# Patient Record
Sex: Male | Born: 1960 | Hispanic: Yes | Marital: Married | Smoking: Never smoker
Health system: Southern US, Community
[De-identification: ages and names within clinical notes are randomized; demographics above are authoritative.]

## PROBLEM LIST (undated history)

## (undated) DIAGNOSIS — R112 Nausea with vomiting, unspecified: Secondary | ICD-10-CM

## (undated) DIAGNOSIS — Z9889 Other specified postprocedural states: Secondary | ICD-10-CM

## (undated) DIAGNOSIS — I10 Essential (primary) hypertension: Secondary | ICD-10-CM

## (undated) DIAGNOSIS — I499 Cardiac arrhythmia, unspecified: Secondary | ICD-10-CM

---

## 2020-11-03 DIAGNOSIS — I340 Nonrheumatic mitral (valve) insufficiency: Secondary | ICD-10-CM | POA: Diagnosis not present

## 2020-11-04 ENCOUNTER — Other Ambulatory Visit: Payer: Self-pay

## 2020-11-04 ENCOUNTER — Encounter (HOSPITAL_COMMUNITY)
Admission: RE | Disposition: A | Discharge status: Home / Self Care | Payer: Self-pay | Source: Other Acute Inpatient Hospital | Attending: Cardiology

## 2020-11-04 ENCOUNTER — Inpatient Hospital Stay (HOSPITAL_COMMUNITY)
Admission: RE | Admit: 2020-11-04 | Discharge: 2020-11-10 | DRG: 286 | Disposition: A | Discharge status: Home / Self Care | Payer: Medicaid Other | Source: Other Acute Inpatient Hospital | Attending: Internal Medicine | Admitting: Internal Medicine

## 2020-11-04 ENCOUNTER — Encounter (HOSPITAL_COMMUNITY): Payer: Self-pay

## 2020-11-04 DIAGNOSIS — E119 Type 2 diabetes mellitus without complications: Secondary | ICD-10-CM | POA: Diagnosis present

## 2020-11-04 DIAGNOSIS — I483 Typical atrial flutter: Secondary | ICD-10-CM | POA: Diagnosis not present

## 2020-11-04 DIAGNOSIS — I5020 Unspecified systolic (congestive) heart failure: Secondary | ICD-10-CM

## 2020-11-04 DIAGNOSIS — I10 Essential (primary) hypertension: Secondary | ICD-10-CM

## 2020-11-04 DIAGNOSIS — Z23 Encounter for immunization: Secondary | ICD-10-CM

## 2020-11-04 DIAGNOSIS — I5021 Acute systolic (congestive) heart failure: Secondary | ICD-10-CM | POA: Diagnosis present

## 2020-11-04 DIAGNOSIS — I5041 Acute combined systolic (congestive) and diastolic (congestive) heart failure: Secondary | ICD-10-CM | POA: Diagnosis not present

## 2020-11-04 DIAGNOSIS — G4489 Other headache syndrome: Secondary | ICD-10-CM

## 2020-11-04 DIAGNOSIS — Z86718 Personal history of other venous thrombosis and embolism: Secondary | ICD-10-CM | POA: Diagnosis not present

## 2020-11-04 DIAGNOSIS — E785 Hyperlipidemia, unspecified: Secondary | ICD-10-CM

## 2020-11-04 DIAGNOSIS — I110 Hypertensive heart disease with heart failure: Secondary | ICD-10-CM | POA: Diagnosis present

## 2020-11-04 DIAGNOSIS — I340 Nonrheumatic mitral (valve) insufficiency: Secondary | ICD-10-CM | POA: Diagnosis present

## 2020-11-04 DIAGNOSIS — H539 Unspecified visual disturbance: Secondary | ICD-10-CM

## 2020-11-04 DIAGNOSIS — I420 Dilated cardiomyopathy: Secondary | ICD-10-CM | POA: Diagnosis present

## 2020-11-04 DIAGNOSIS — I4892 Unspecified atrial flutter: Secondary | ICD-10-CM | POA: Diagnosis present

## 2020-11-04 DIAGNOSIS — I743 Embolism and thrombosis of arteries of the lower extremities: Secondary | ICD-10-CM

## 2020-11-04 DIAGNOSIS — Z825 Family history of asthma and other chronic lower respiratory diseases: Secondary | ICD-10-CM | POA: Diagnosis not present

## 2020-11-04 DIAGNOSIS — D352 Benign neoplasm of pituitary gland: Secondary | ICD-10-CM

## 2020-11-04 DIAGNOSIS — R072 Precordial pain: Secondary | ICD-10-CM | POA: Diagnosis present

## 2020-11-04 DIAGNOSIS — R0989 Other specified symptoms and signs involving the circulatory and respiratory systems: Secondary | ICD-10-CM

## 2020-11-04 HISTORY — DX: Essential (primary) hypertension: I10

## 2020-11-04 HISTORY — PX: RIGHT/LEFT HEART CATH AND CORONARY ANGIOGRAPHY: CATH118266

## 2020-11-04 HISTORY — PX: RIGHT/LEFT HEART CATH AND CORONARY/GRAFT ANGIOGRAPHY: CATH118267

## 2020-11-04 LAB — POCT I-STAT EG7
Acid-Base Excess: 2 mmol/L (ref 0.0–2.0)
Acid-Base Excess: 2 mmol/L (ref 0.0–2.0)
Bicarbonate: 28.6 mmol/L — ABNORMAL HIGH (ref 20.0–28.0)
Bicarbonate: 28.9 mmol/L — ABNORMAL HIGH (ref 20.0–28.0)
Calcium, Ion: 1.27 mmol/L (ref 1.15–1.40)
Calcium, Ion: 1.3 mmol/L (ref 1.15–1.40)
HCT: 43 % (ref 39.0–52.0)
HCT: 43 % (ref 39.0–52.0)
Hemoglobin: 14.6 g/dL (ref 13.0–17.0)
Hemoglobin: 14.6 g/dL (ref 13.0–17.0)
O2 Saturation: 67 %
O2 Saturation: 69 %
Potassium: 4.1 mmol/L (ref 3.5–5.1)
Potassium: 4.1 mmol/L (ref 3.5–5.1)
Sodium: 140 mmol/L (ref 135–145)
Sodium: 140 mmol/L (ref 135–145)
TCO2: 30 mmol/L (ref 22–32)
TCO2: 30 mmol/L (ref 22–32)
pCO2, Ven: 50.1 mmHg (ref 44.0–60.0)
pCO2, Ven: 50.8 mmHg (ref 44.0–60.0)
pH, Ven: 7.363 (ref 7.250–7.430)
pH, Ven: 7.364 (ref 7.250–7.430)
pO2, Ven: 37 mmHg (ref 32.0–45.0)
pO2, Ven: 38 mmHg (ref 32.0–45.0)

## 2020-11-04 LAB — POCT I-STAT 7, (LYTES, BLD GAS, ICA,H+H)
Acid-Base Excess: 0 mmol/L (ref 0.0–2.0)
Bicarbonate: 25.2 mmol/L (ref 20.0–28.0)
Calcium, Ion: 1.23 mmol/L (ref 1.15–1.40)
HCT: 42 % (ref 39.0–52.0)
Hemoglobin: 14.3 g/dL (ref 13.0–17.0)
O2 Saturation: 98 %
Potassium: 4 mmol/L (ref 3.5–5.1)
Sodium: 140 mmol/L (ref 135–145)
TCO2: 26 mmol/L (ref 22–32)
pCO2 arterial: 41.6 mmHg (ref 32.0–48.0)
pH, Arterial: 7.391 (ref 7.350–7.450)
pO2, Arterial: 115 mmHg — ABNORMAL HIGH (ref 83.0–108.0)

## 2020-11-04 LAB — TSH: TSH: 0.847 u[IU]/mL (ref 0.350–4.500)

## 2020-11-04 LAB — MAGNESIUM: Magnesium: 2.1 mg/dL (ref 1.7–2.4)

## 2020-11-04 SURGERY — RIGHT/LEFT HEART CATH AND CORONARY/GRAFT ANGIOGRAPHY
Anesthesia: LOCAL

## 2020-11-04 MED ORDER — AMIODARONE LOAD VIA INFUSION
150.0000 mg | Freq: Once | INTRAVENOUS | Status: AC
  Administered 2020-11-04: 150 mg via INTRAVENOUS
  Filled 2020-11-04: qty 83.34

## 2020-11-04 MED ORDER — VERAPAMIL HCL 2.5 MG/ML IV SOLN
INTRAVENOUS | Status: AC
  Filled 2020-11-04: qty 2

## 2020-11-04 MED ORDER — FENTANYL CITRATE (PF) 100 MCG/2ML IJ SOLN
INTRAMUSCULAR | Status: DC | PRN
  Administered 2020-11-04: 25 ug via INTRAVENOUS

## 2020-11-04 MED ORDER — ACETAMINOPHEN 325 MG PO TABS
650.0000 mg | ORAL_TABLET | ORAL | Status: DC | PRN
  Administered 2020-11-05 – 2020-11-09 (×3): 650 mg via ORAL
  Filled 2020-11-04 (×3): qty 2

## 2020-11-04 MED ORDER — IOHEXOL 350 MG/ML SOLN
INTRAVENOUS | Status: DC | PRN
  Administered 2020-11-04: 55 mL

## 2020-11-04 MED ORDER — VERAPAMIL HCL 2.5 MG/ML IV SOLN
INTRAVENOUS | Status: DC | PRN
  Administered 2020-11-04: 10 mL via INTRA_ARTERIAL

## 2020-11-04 MED ORDER — HEPARIN (PORCINE) 25000 UT/250ML-% IV SOLN
1250.0000 [IU]/h | INTRAVENOUS | Status: DC
  Administered 2020-11-05: 1100 [IU]/h via INTRAVENOUS
  Filled 2020-11-04: qty 250

## 2020-11-04 MED ORDER — SODIUM CHLORIDE 0.9% FLUSH: 3.0000 mL | INTRAVENOUS | Status: DC | PRN

## 2020-11-04 MED ORDER — HEPARIN (PORCINE) IN NACL 1000-0.9 UT/500ML-% IV SOLN
INTRAVENOUS | Status: DC | PRN
  Administered 2020-11-04 (×2): 500 mL

## 2020-11-04 MED ORDER — SODIUM CHLORIDE 0.9 % IV SOLN: 250.0000 mL | INTRAVENOUS | Status: DC | PRN

## 2020-11-04 MED ORDER — HEPARIN (PORCINE) IN NACL 1000-0.9 UT/500ML-% IV SOLN
INTRAVENOUS | Status: AC
  Filled 2020-11-04: qty 1000

## 2020-11-04 MED ORDER — HEPARIN SODIUM (PORCINE) 1000 UNIT/ML IJ SOLN
INTRAMUSCULAR | Status: DC | PRN
  Administered 2020-11-04: 4000 [IU] via INTRAVENOUS

## 2020-11-04 MED ORDER — HYDRALAZINE HCL 20 MG/ML IJ SOLN: 10.0000 mg | INTRAMUSCULAR | Status: AC | PRN

## 2020-11-04 MED ORDER — SODIUM CHLORIDE 0.9% FLUSH
3.0000 mL | Freq: Two times a day (BID) | INTRAVENOUS | Status: DC
  Administered 2020-11-04 – 2020-11-10 (×7): 3 mL via INTRAVENOUS

## 2020-11-04 MED ORDER — FENTANYL CITRATE (PF) 100 MCG/2ML IJ SOLN
INTRAMUSCULAR | Status: AC
  Filled 2020-11-04: qty 2

## 2020-11-04 MED ORDER — SODIUM CHLORIDE 0.9 % IV SOLN: INTRAVENOUS | Status: AC

## 2020-11-04 MED ORDER — AMIODARONE HCL IN DEXTROSE 360-4.14 MG/200ML-% IV SOLN
30.0000 mg/h | INTRAVENOUS | Status: DC
  Administered 2020-11-05 – 2020-11-07 (×4): 30 mg/h via INTRAVENOUS
  Filled 2020-11-04 (×5): qty 200

## 2020-11-04 MED ORDER — DIPHENHYDRAMINE HCL 25 MG PO CAPS
25.0000 mg | ORAL_CAPSULE | Freq: Four times a day (QID) | ORAL | Status: DC | PRN
  Administered 2020-11-04 – 2020-11-07 (×6): 25 mg via ORAL
  Filled 2020-11-04 (×6): qty 1

## 2020-11-04 MED ORDER — PNEUMOCOCCAL VAC POLYVALENT 25 MCG/0.5ML IJ INJ
0.5000 mL | INJECTION | INTRAMUSCULAR | Status: DC
  Filled 2020-11-04: qty 0.5

## 2020-11-04 MED ORDER — LABETALOL HCL 5 MG/ML IV SOLN: 10.0000 mg | INTRAVENOUS | Status: AC | PRN

## 2020-11-04 MED ORDER — MIDAZOLAM HCL 2 MG/2ML IJ SOLN
INTRAMUSCULAR | Status: AC
  Filled 2020-11-04: qty 2

## 2020-11-04 MED ORDER — LIDOCAINE HCL (PF) 1 % IJ SOLN
INTRAMUSCULAR | Status: DC | PRN
  Administered 2020-11-04 (×2): 2 mL

## 2020-11-04 MED ORDER — ONDANSETRON HCL 4 MG/2ML IJ SOLN
4.0000 mg | Freq: Four times a day (QID) | INTRAMUSCULAR | Status: DC | PRN
  Administered 2020-11-09: 4 mg via INTRAVENOUS
  Filled 2020-11-04: qty 2

## 2020-11-04 MED ORDER — LIDOCAINE HCL (PF) 1 % IJ SOLN
INTRAMUSCULAR | Status: AC
  Filled 2020-11-04: qty 30

## 2020-11-04 MED ORDER — LOSARTAN POTASSIUM 25 MG PO TABS
25.0000 mg | ORAL_TABLET | Freq: Every day | ORAL | Status: DC
  Administered 2020-11-04 – 2020-11-05 (×2): 25 mg via ORAL
  Filled 2020-11-04 (×2): qty 1

## 2020-11-04 MED ORDER — MIDAZOLAM HCL 2 MG/2ML IJ SOLN
INTRAMUSCULAR | Status: DC | PRN
  Administered 2020-11-04: 1 mg via INTRAVENOUS

## 2020-11-04 MED ORDER — AMIODARONE HCL IN DEXTROSE 360-4.14 MG/200ML-% IV SOLN
60.0000 mg/h | INTRAVENOUS | Status: AC
  Administered 2020-11-04 (×2): 60 mg/h via INTRAVENOUS
  Filled 2020-11-04: qty 200

## 2020-11-04 MED ORDER — METOPROLOL SUCCINATE ER 50 MG PO TB24
50.0000 mg | ORAL_TABLET | Freq: Every day | ORAL | Status: DC
  Administered 2020-11-04 – 2020-11-08 (×5): 50 mg via ORAL
  Filled 2020-11-04 (×6): qty 1

## 2020-11-04 MED ORDER — HEPARIN SODIUM (PORCINE) 1000 UNIT/ML IJ SOLN
INTRAMUSCULAR | Status: AC
  Filled 2020-11-04: qty 1

## 2020-11-04 SURGICAL SUPPLY — 12 items
CATH 5FR JL3.5 JR4 ANG PIG MP (CATHETERS) ×2 IMPLANT
CATH BALLN WEDGE 5F 110CM (CATHETERS) ×2 IMPLANT
DEVICE RAD COMP TR BAND LRG (VASCULAR PRODUCTS) ×4 IMPLANT
GLIDESHEATH SLEND SS 6F .021 (SHEATH) ×2 IMPLANT
GUIDEWIRE INQWIRE 1.5J.035X260 (WIRE) ×1 IMPLANT
INQWIRE 1.5J .035X260CM (WIRE) ×2
KIT HEART LEFT (KITS) ×2 IMPLANT
PACK CARDIAC CATHETERIZATION (CUSTOM PROCEDURE TRAY) ×2 IMPLANT
SHEATH GLIDE SLENDER 4/5FR (SHEATH) ×2 IMPLANT
SYR MEDRAD MARK 7 150ML (SYRINGE) ×2 IMPLANT
TRANSDUCER W/STOPCOCK (MISCELLANEOUS) ×2 IMPLANT
TUBING CIL FLEX 10 FLL-RA (TUBING) ×2 IMPLANT

## 2020-11-04 NOTE — Progress Notes (Signed)
ANTICOAGULATION CONSULT NOTE - Initial Consult  Pharmacy Consult for heparin Indication: atrial fibrillation  Not on File  Patient Measurements: Height: 5\' 6"  (167.6 cm) Weight: 78 kg (172 lb) IBW/kg (Calculated) : 63.8  Vital Signs: Temp: 97.7 F (36.5 C) (07/15 1616) Temp Source: Oral (07/15 1616) BP: 126/94 (07/15 1548) Pulse Rate: 109 (07/15 1616)  Labs: No results for input(s): HGB, HCT, PLT, APTT, LABPROT, INR, HEPARINUNFRC, HEPRLOWMOCWT, CREATININE, CKTOTAL, CKMB, TROPONINIHS in the last 72 hours.  CrCl cannot be calculated (No successful lab value found.).   Medical History: Past Medical History:  Diagnosis Date   HTN (hypertension)     Medications:  Medication history pending - dispense report shows no medications  Assessment: 57 yom transferred from [facility] for evaluation of chest pain; no anticoagulation PTA noted and none given at [facility] per MD note. He is also noted to have persistent A flutter. Patient is now s/p cath with normal coronary anatomy. Pharmacy consulted to begin IV heparin drip 8 hrs post sheath removal for A flutter. Sheath removed at 15:53. Plan is to transition to apixaban tomorrow with TEE-DCCV on Mon if A flutter persists.   RH labs from 7/14:  WBC 6.7 H/H 14.4/42.8 Plt 209 SCr 1  Goal of Therapy:  Heparin level 0.3-0.7 units/ml Monitor platelets by anticoagulation protocol: Yes   Plan:  Begin heparin drip at 1100 units/hr with no bolus at midnight 6h heparin level Daily heparin level, CBC Monitor for s/sx of bleeding F/U transition to apixaban  Thank you for involving pharmacy in this patient's care.  Renold Genta, PharmD, BCPS Clinical Pharmacist Clinical phone for 11/04/2020 until 10p is [phone] 11/04/2020 4:47 PM  **Pharmacist phone directory can be found on [email or web address] listed under [place]**

## 2020-11-04 NOTE — Plan of Care (Signed)
  Problem: Education: Goal: Understanding of CV disease, CV risk reduction, and recovery process will improve Outcome: Progressing Goal: Individualized Educational Video(s) Outcome: Progressing   

## 2020-11-04 NOTE — H&P (Signed)
Cardiology Admission History and Physical:   Patient ID: Hunter Miller MRN: [id number]; DOB: 04-09-1961   Admission date: 11/04/2020  PCP:  Pcp, No   [facility] Providers Cardiologist:  Dr. Harriet Masson, MD}    Chief Complaint:  Chest pain   Patient Profile:   Hunter Miller is a 60 y.o. male with a history of hypertension and hyperlipidemia with questionable history of arterial embolization in his right femoral artery approximately 10 years prior who was seen in cardiology consultation at [facility] for the evaluation of chest pain.    History of Present Illness:   [name] is a 60 year old male with a history stated above who presented to [facility] for the evaluation of chest pain. Patient reportedly had a several week history of worsening chest pain on exertion described as a left-sided stabbing sensation.  Symptoms were abrupt but intermittent in duration.  Given his symptoms, he presented to [facility] for further evaluation.  Chest pain improved with sublingual nitroglycerin.  Additionally, patient had symptoms of what sounds like palpitations for the last several months which time he was noticing his heartbeat would be "fast and irregular".  While still at [facility] Echocardiogram which showed severe global hypokinesis of LV at 20 to 25% with normal left and right atrial size, mild aortic valve sclerosis with no stenosis, mild to moderate mitral regurgitation, trace tricuspid regurgitation and no pericardial effusion.  On Dr. Terrial Rhodes evaluation, he was noted to be in atrial flutter 3-1 with suspicion this was contributing to his reduced LVEF.  Plan was to transfer to [facility] for right and left cardiac catheterization followed by possible TEE cardioversion.  Anticoagulation was held given the need for LHC.  Patient was given lisinopril however this was discontinued with plans for [facility] after ACEI washout.  Past Medical History:  Diagnosis Date   HTN  (hypertension)     The histories are not reviewed yet. Please review them in the "History" navigator section and refresh this [place].   Medications Prior to Admission: Prior to Admission medications   Not on File    Allergies:   Not on File  Social History:   Social History   Socioeconomic History   Marital status: Married    Spouse name: Not on file   Number of children: Not on file   Years of education: Not on file   Highest education level: Not on file  Occupational History   Not on file  Tobacco Use   Smoking status: Not on file   Smokeless tobacco: Not on file  Substance and Sexual Activity   Alcohol use: Not on file   Drug use: Not on file   Sexual activity: Not on file  Other Topics Concern   Not on file  Social History Narrative   Not on file   Social Determinants of Health   Financial Resource Strain: Not on file  Food Insecurity: Not on file  Transportation Needs: Not on file  Physical Activity: Not on file  Stress: Not on file  Social Connections: Not on file  Intimate Partner Violence: Not on file    Family History:    The patient's family history includes Asthma in his father; COPD in his father.    ROS:  Please see the history of present illness.  All other ROS reviewed and negative.     Physical Exam/Data:  There were no vitals filed for this visit. No intake or output data in the 24 hours ending 11/04/20 1522 No  flowsheet data found.   There is no height or weight on file to calculate BMI.   Physical exam performed by interventional cardiologist in the cardiac catheterization lab  Relevant CV Studies:  LHC 11/04/2020: Pending   Echocardiogram 11/04/2020:  1.  There are severe global hypokinesis of the LV 2.  Overall left ventricular systolic function is severely impaired with an EF between 20 and 25% 3.  The right ventricle is normal in size and function 4.  The left atrium is normal in size 5.  The right atrium is normal in size  and function 6.  There is mild aortic valve sclerosis 7.  Mild to moderate mitral regurgitation is present 8.  Trace tricuspid regurgitation present 9.  The pulmonic valve is normal 10.  There is no pericardial effusion  Laboratory Data:  High Sensitivity Troponin:  No results for input(s): TROPONINIHS in the last 720 hours.    ChemistryNo results for input(s): NA, K, CL, CO2, GLUCOSE, BUN, CREATININE, CALCIUM, GFRNONAA, GFRAA, ANIONGAP in the last 168 hours.  No results for input(s): PROT, ALBUMIN, AST, ALT, ALKPHOS, BILITOT in the last 168 hours. HematologyNo results for input(s): WBC, RBC, HGB, HCT, MCV, MCH, MCHC, RDW, PLT in the last 168 hours. BNPNo results for input(s): BNP, PROBNP in the last 168 hours.  DDimer No results for input(s): DDIMER in the last 168 hours.   Radiology/Studies:  No results found.  Assessment and Plan:   1.  Chest pain with newly reduced LVEF: -Patient presented to [facility] with atypical chest pain found to have a severely reduced LVEF, dilated cardiomyopathy at 20 to 25% per echocardiogram on 11/07/2020.  Plan was for transfer to [facility] for LHC to rule out ischemic etiology.  Also noted to be in atrial flutter 3-1 felt to also be playing a role in his cardiomyopathy.  Initially patient started on lisinopril per hospitalist team however this was discontinued with a plan to start low-dose Entresto if BP and renal function allows.  2.  Atrial flutter, paroxysmal: -Noted to be in atrial flutter with poor rate control on cardiology consultation.  Patient not started on rate controlling medications or anticoagulation given the need for [facility] and transferred to [facility].  Plan pending LHC results.  Likely will need IV amiodarone for better rate control in the post-cath setting.  If difficult to control rate, may need to consider TEE/DCCV. -CHA2DS2VASc at least 3 based on HTN +1, prior thromboembolism +2  3.  HTN: -Stable, 126/96 -Unclear if patient is taking  antihypertensives  4.  HLD: -Lipid panel, CHO-251, LDL-173 -Will obtain LFTs with a.m. labs -Start 40 mg daily uptitrate if LHC reveals significant CAD  5.  Mild to moderate mitral regurgitation: -Noted on echocardiogram performed at [facility] 11/04/2020 -Monitor with serial imaging  6.  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 Terrial Rhodes note) there was questionable history of arterial embolism which the patient previously was on anticoagulation however there was limited information and poor history on this  Risk Assessment/Risk Scores:  { Severity of Illness: The appropriate patient status for this patient is INPATIENT. Inpatient status is judged to be reasonable and necessary in order to provide the required intensity of service to ensure the patient's safety. The patient's presenting symptoms, physical exam findings, and initial radiographic and laboratory data in the context of their chronic comorbidities is felt to place them at high risk for further clinical deterioration. Furthermore, it is not anticipated  that the patient will be medically stable for discharge from the hospital within 2 midnights of admission. The following factors support the patient status of inpatient.   " The patient's presenting symptoms include chest pain, palpitations. " The worrisome physical exam findings include chest pain/abnormal EKG, abnormal echocardiogram. " The initial radiographic and laboratory data are worrisome because of abnormal echocardiogram. " The chronic co-morbidities include hypertension.  * I certify that at the point of admission it is my clinical judgment that the patient will require inpatient hospital care spanning beyond 2 midnights from the point of admission due to high intensity of service, high risk for further deterioration and high frequency of surveillance required.*   For questions or updates, please contact [place] Please consult [email or web address] for contact info under     Signed, Kathyrn Drown, NP  11/04/2020 3:22 PM

## 2020-11-05 DIAGNOSIS — R072 Precordial pain: Secondary | ICD-10-CM

## 2020-11-05 DIAGNOSIS — I483 Typical atrial flutter: Secondary | ICD-10-CM

## 2020-11-05 LAB — CBC
HCT: 41.7 % (ref 39.0–52.0)
Hemoglobin: 13.7 g/dL (ref 13.0–17.0)
MCH: 27.1 pg (ref 26.0–34.0)
MCHC: 32.9 g/dL (ref 30.0–36.0)
MCV: 82.6 fL (ref 80.0–100.0)
Platelets: 250 K/uL (ref 150–400)
RBC: 5.05 MIL/uL (ref 4.22–5.81)
RDW: 14.6 % (ref 11.5–15.5)
WBC: 9.7 K/uL (ref 4.0–10.5)
nRBC: 0 % (ref 0.0–0.2)

## 2020-11-05 LAB — HEPARIN LEVEL (UNFRACTIONATED): Heparin Unfractionated: 0.19 IU/mL — ABNORMAL LOW (ref 0.30–0.70)

## 2020-11-05 MED ORDER — APIXABAN 5 MG PO TABS
5.0000 mg | ORAL_TABLET | Freq: Two times a day (BID) | ORAL | Status: DC
  Administered 2020-11-05 – 2020-11-10 (×11): 5 mg via ORAL
  Filled 2020-11-05 (×11): qty 1

## 2020-11-05 MED ORDER — FUROSEMIDE 10 MG/ML IJ SOLN
40.0000 mg | Freq: Once | INTRAMUSCULAR | Status: AC
  Administered 2020-11-05: 40 mg via INTRAVENOUS
  Filled 2020-11-05: qty 4

## 2020-11-05 NOTE — Discharge Instructions (Addendum)
Stay off work until after clinic follow-up.  Information on my medicine - ELIQUIS (apixaban)  This medication education was reviewed with me or my healthcare representative as part of my discharge preparation.    Why was Eliquis prescribed for you? Eliquis was prescribed for you to reduce the risk of a blood clot forming that can cause a stroke if you have a medical condition called atrial fibrillation (a type of irregular heartbeat).  What do You need to know about Eliquis ? Take your Eliquis TWICE DAILY - one tablet in the morning and one tablet in the evening with or without food. If you have difficulty swallowing the tablet whole please discuss with your pharmacist how to take the medication safely.  Take Eliquis exactly as prescribed by your doctor and DO NOT stop taking Eliquis without talking to the doctor who prescribed the medication.  Stopping may increase your risk of developing a stroke.  Refill your prescription before you run out.  After discharge, you should have regular check-up appointments with your healthcare provider that is prescribing your Eliquis.  In the future your dose may need to be changed if your kidney function or weight changes by a significant amount or as you get older.  What do you do if you miss a dose? If you miss a dose, take it as soon as you remember on the same day and resume taking twice daily.  Do not take more than one dose of ELIQUIS at the same time to make up a missed dose.  Important Safety Information A possible side effect of Eliquis is bleeding. You should call your healthcare provider right away if you experience any of the following: Bleeding from an injury or your nose that does not stop. Unusual colored urine (red or dark brown) or unusual colored stools (red or black). Unusual bruising for unknown reasons. A serious fall or if you hit your head (even if there is no bleeding).  Some medicines may interact with Eliquis and might  increase your risk of bleeding or clotting while on Eliquis. To help avoid this, consult your healthcare provider or pharmacist prior to using any new prescription or non-prescription medications, including herbals, vitamins, non-steroidal anti-inflammatory drugs (NSAIDs) and supplements.  This website has more information on Eliquis (apixaban): http://www.eliquis.com/eliquis/home

## 2020-11-05 NOTE — Consult Note (Addendum)
Advanced Heart Failure Team Consult Note   Primary Physician: Pcp, No PCP-Cardiologist:  None  Reason for Consultation: Acute systolic HF  HPI:    Hunter Miller is seen today for evaluation of acute systolic HF at the request of Dr. [place].   Hunter Miller is a 60 year old male with HTN, HL and RFA embolization. Admitted to [facility] with CP and palpitations.   ECG showed AFL with RVR. Echocardiogram which showed severe global hypokinesis of LV at 20 to 25% with normal left and right atrial size, mild aortic valve sclerosis with no stenosis, mild to moderate mitral regurgitation, trace tricuspid regurgitation and no pericardial effusion.   Had cath 7/16. 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 Started on IV amio and GDMT.   Feels SOB/+ orthopnea  Review of Systems: [y] = yes, [ ]  = no   General: Weight gain [ ] ; Weight loss [ ] ; Anorexia [ ] ; Fatigue [ ] ; Fever [ ] ; Chills [ ] ; Weakness [ ]   Cardiac: Chest pain/pressure [ y]; Resting SOB [ ] ; Exertional SOB [ y]; Orthopnea [ ] ; Pedal Edema [ ] ; Palpitations Blue.Reese ]; Syncope [ ] ; Presyncope [ ] ; Paroxysmal nocturnal dyspnea[ ]   Pulmonary: Cough [ ] ; Wheezing[ ] ; Hemoptysis[ ] ; Sputum [ ] ; Snoring [ ]   GI: Vomiting[ ] ; Dysphagia[ ] ; Melena[ ] ; Hematochezia [ ] ; Heartburn[ ] ; Abdominal pain [ ] ; Constipation [ ] ; Diarrhea [ ] ; BRBPR [ ]   GU: Hematuria[ ] ; Dysuria [ ] ; Nocturia[ ]   Vascular: Pain in legs with walking [ ] ; Pain in feet with lying flat [ ] ; Non-healing sores [ ] ; Stroke [ ] ; TIA [ ] ; Slurred speech [ ] ;  Neuro: Headaches[ ] ; Vertigo[ ] ; Seizures[ ] ; Paresthesias[ ] ;Blurred vision [ ] ; Diplopia [ ] ; Vision changes [ ]   Ortho/Skin: Arthritis [ y]; Joint pain Blue.Reese ]; Muscle pain [ ] ; Joint swelling [ ] ; Back Pain [ ] ; Rash [ ]   Psych: Depression[ ] ; Anxiety[ ]   Heme: Bleeding problems [ ] ; Clotting disorders [ ] ; Anemia [ ]   Endocrine: Diabetes [ ] ; Thyroid dysfunction[ ]    Past  Medical History: Past Medical History:  Diagnosis Date   HTN (hypertension)     Family History: Family History  Problem Relation Age of Onset   Asthma Father    COPD Father     Social History: Social History   Socioeconomic History   Marital status: Married    Spouse name: Not on file   Number of children: Not on file   Years of education: Not on file   Highest education level: Not on file  Occupational History   Not on file  Tobacco Use   Smoking status: Never   Smokeless tobacco: Never  Substance and Sexual Activity   Alcohol use: Not on file   Drug use: Not on file   Sexual activity: Not on file  Other Topics Concern   Not on file  Social History Narrative   Not on file   Social Determinants of Health   Financial Resource Strain: Not on file  Food Insecurity: Not on file  Transportation Needs: Not on file  Physical Activity: Not on file  Stress: Not on file  Social Connections: Not on file    Allergies:  No Known Allergies  Objective:    Vital Signs:   Temp:  [97.7 F (36.5 C)-98.5 F (36.9 C)] 97.7 F (36.5 C) (07/16 1219) Pulse Rate:  [  64-114] 101 (07/16 1219) Resp:  [8-31] 18 (07/16 1219) BP: (98-138)/(60-103) 112/90 (07/16 1242) SpO2:  [96 %-100 %] 99 % (07/16 1219) Weight:  [78 kg] 78 kg (07/15 1643) Last BM Date: 11/03/20  Weight change: Filed Weights   11/04/20 1643  Weight: 78 kg    Intake/Output:   Intake/Output Summary (Last 24 hours) at 11/05/2020 1353 Last data filed at 11/04/2020 1800 Gross per 24 hour  Intake 87.51 ml  Output --  Net 87.51 ml      Physical Exam    General:  Sitting up No resp difficulty HEENT: normal Neck: supple. JVP 8-9 . Carotids 2+ bilat; no bruits. No lymphadenopathy or thyromegaly appreciated. Cor: PMI nondisplaced. Irregular rate & rhythm. No rubs, gallops or murmurs. Lungs: clear Abdomen: soft, nontender, nondistended. No hepatosplenomegaly. No bruits or masses. Good bowel  sounds. Extremities: no cyanosis, clubbing, rash, edema Neuro: alert & orientedx3, cranial nerves grossly intact. moves all 4 extremities w/o difficulty. Affect pleasant   Telemetry   AFL 100-110 Personally reviewed   EKG     (From [place]) AFL with variable block. Rate 71. Diffuse TWI Personally reviewed    Labs   Basic Metabolic Panel: Recent Labs  Lab 11/04/20 1539 11/04/20 1542 11/04/20 1729  NA 140 140  140  --   K 4.0 4.1  4.1  --   MG  --   --  2.1    Liver Function Tests: No results for input(s): AST, ALT, ALKPHOS, BILITOT, PROT, ALBUMIN in the last 168 hours. No results for input(s): LIPASE, AMYLASE in the last 168 hours. No results for input(s): AMMONIA in the last 168 hours.  CBC: Recent Labs  Lab 11/04/20 1539 11/04/20 1542 11/05/20 0612  WBC  --   --  9.7  HGB 14.3 14.6  14.6 13.7  HCT 42.0 43.0  43.0 41.7  MCV  --   --  82.6  PLT  --   --  250    Cardiac Enzymes: No results for input(s): CKTOTAL, CKMB, CKMBINDEX, TROPONINI in the last 168 hours.  BNP: BNP (last 3 results) No results for input(s): BNP in the last 8760 hours.  ProBNP (last 3 results) No results for input(s): PROBNP in the last 8760 hours.   CBG: No results for input(s): GLUCAP in the last 168 hours.  Coagulation Studies: No results for input(s): LABPROT, INR in the last 72 hours.   Imaging   CARDIAC CATHETERIZATION  Result Date: 11/04/2020 Formatting of this result is different from the original.   LV end diastolic pressure is normal. Very large and normal coronary arteries. Normal LV filling pressures. Normal right  heart pressures. Reduced cardiac output. Index 2.26. Plan: will resume IV heparin tonight with plans to initiate oral anticoagulation with Eliquis in am. Change ACEi to losartan in anticipation of transitioning to [place] after 3 days. Start Toprol XL for rate control. Load with IV amiodarone. If Atrial flutter persists plan TEE guided DCCV early in  the week.     Medications:     Current Medications:  apixaban  5 mg Oral BID   losartan  25 mg Oral q1800   metoprolol succinate  50 mg Oral q1800   pneumococcal 23 valent vaccine  0.5 mL Intramuscular Tomorrow-1000   sodium chloride flush  3 mL Intravenous Q12H    Infusions:  sodium chloride     amiodarone 30 mg/hr (11/05/20 0804)      Assessment/Plan   1. Acute systolic HF - Echo LVEF  20-25% with normal left and right atrial size, mild aortic valve sclerosis with no stenosis, mild to moderate mitral regurgitation, trace tricuspid regurgitation and no pericardial effusion.  - Cath 7/16. 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 Give IV lasix x 1.  - Continue losartan 25 daily (can switch to [facility] after DC-CV if BP permits)  - Continue Toprol 50 daily  - Add spiro 12.5 - Plan TEE/DC-CV Monday/Tuesday depending on schedule  2. Atrial flutter - Continue IV amio & Eliquis - Plan TEE/DC-CV Monday/Tuesday depending on schedule - EP following for outpatient ablation - No ECG in Epic. Will obtain.  - Mild snoring. Will need PSG. Denies ETOH  3. HL  -Lipid panel, CHO-251, LDL-173 - consider statin  Length of Stay: 1  Glori Bickers, MD  11/05/2020, 1:53 PM  Advanced Heart Failure Team Pager [phone] (M-F; 7a - 5p)  Please contact [facility] for night-coverage after hours (4p -7a ) and weekends on [email or web address]

## 2020-11-05 NOTE — Progress Notes (Addendum)
ANTICOAGULATION CONSULT NOTE - Initial Consult  Pharmacy Consult for heparin Indication: atrial fibrillation  No Known Allergies  Patient Measurements: Height: 5\' 6"  (167.6 cm) Weight: 78 kg (172 lb) IBW/kg (Calculated) : 63.8  Vital Signs: Temp: 98.3 F (36.8 C) (07/16 0505) Temp Source: Oral (07/16 0505) BP: 109/80 (07/16 0505) Pulse Rate: 74 (07/16 0505)  Labs: Recent Labs    11/04/20 1539 11/04/20 1542 11/05/20 0612  HGB 14.3 14.6  14.6 13.7  HCT 42.0 43.0  43.0 41.7  PLT  --   --  250  HEPARINUNFRC  --   --  0.19*    CrCl cannot be calculated (No successful lab value found.).   Medical History: Past Medical History:  Diagnosis Date   HTN (hypertension)      Assessment: 60 yo M with new persistent A flutter. Patient is now s/p cath with normal coronary anatomy. No anticoagulation prior to admission. Pharmacy consulted for heparin.    HL 0.19 subtherapeutic on 1100 units/hr. Plan is to transition to apixaban 7/16 with TEE cardioversion 7/18, pending team assessment. H/H, plt stable.  No issues with infusion or bleeding per RN.  Goal of Therapy:  Heparin level 0.3-0.7 units/ml Monitor platelets by anticoagulation protocol: Yes   Plan:  Increase heparin to 1250 units/hr F/u 8hr HL  Monitor daily HL, CBC/plt Monitor for signs/symptoms of bleeding  F/U transition to apixaban  Benetta Spar, PharmD, BCPS, [place] Clinical Pharmacist  Please check AMION for all [place] phone numbers After 10:00 PM, call [place]

## 2020-11-05 NOTE — Progress Notes (Signed)
Was called to pt bedside for a complaint of chest pain and shortness of breath, medial, rated 5/10. VS WNL, O2 applied at 4L and 12-lead EKG obtained showing atrial flutter. I handed this to MD Quentin Ore and told him a review of his symptoms. Patient stated he feels "a little better" now. Left patient on 2L O2 saturating 100%. MD said continue current therapy and monitor at this time.

## 2020-11-05 NOTE — Progress Notes (Signed)
Progress Note  Patient Name: Hunter Miller Date of Encounter: 11/05/2020  [facility] Cardiologist: None   Subjective   Pt with chest pain this AM. Vitals stable. Repeat ECG stable showing AFL.  Patient without pain at the time of my evaluation. Complaining of an itchy rash/bites on his skin/hands/back/stomach.  Inpatient Medications    Scheduled Meds:  losartan  25 mg Oral q1800   metoprolol succinate  50 mg Oral q1800   pneumococcal 23 valent vaccine  0.5 mL Intramuscular Tomorrow-1000   sodium chloride flush  3 mL Intravenous Q12H   Continuous Infusions:  sodium chloride     amiodarone 30 mg/hr (11/05/20 0804)   heparin 1,250 Units/hr (11/05/20 0805)   PRN Meds: sodium chloride, acetaminophen, diphenhydrAMINE, ondansetron (ZOFRAN) IV, sodium chloride flush   Vital Signs    Vitals:   11/05/20 0505 11/05/20 0800 11/05/20 0932 11/05/20 0954  BP: 109/80 (!) 127/96 98/60 113/89  Pulse: 74  76 72  Resp: 15 16 16    Temp: 98.3 F (36.8 C)     TempSrc: Oral     SpO2: 97%  100% 100%  Weight:      Height:        Intake/Output Summary (Last 24 hours) at 11/05/2020 1041 Last data filed at 11/04/2020 1800 Gross per 24 hour  Intake 87.51 ml  Output --  Net 87.51 ml   Last 3 Weights 11/04/2020  Weight (lbs) 172 lb  Weight (kg) 78.019 kg      Telemetry    AFL with variable AV conduction. - Personally Reviewed  ECG    AFL - Personally Reviewed  Physical Exam   GEN: No acute distress.   Neck: No JVD Cardiac: irregularly irregular, no murmurs, rubs, or gallops.  Respiratory: Clear to auscultation bilaterally. GI: Soft, nontender, non-distended  MS: No edema; No deformity. Neuro:  Nonfocal  Psych: Normal affect   Labs    High Sensitivity Troponin:  No results for input(s): TROPONINIHS in the last 720 hours.    Chemistry Recent Labs  Lab 11/04/20 1539 11/04/20 1542  NA 140 140  140  K 4.0 4.1  4.1     Hematology Recent Labs  Lab  11/04/20 1539 11/04/20 1542 11/05/20 0612  WBC  --   --  9.7  RBC  --   --  5.05  HGB 14.3 14.6  14.6 13.7  HCT 42.0 43.0  43.0 41.7  MCV  --   --  82.6  MCH  --   --  27.1  MCHC  --   --  32.9  RDW  --   --  14.6  PLT  --   --  250    BNPNo results for input(s): BNP, PROBNP in the last 168 hours.   DDimer No results for input(s): DDIMER in the last 168 hours.   Radiology    CARDIAC CATHETERIZATION  Result Date: 11/04/2020 Formatting of this result is different from the original.   LV end diastolic pressure is normal. Very large and normal coronary arteries. Normal LV filling pressures. Normal right  heart pressures. Reduced cardiac output. Index 2.26. Plan: will resume IV heparin tonight with plans to initiate oral anticoagulation with Eliquis in am. Change ACEi to losartan in anticipation of transitioning to [place] after 3 days. Start Toprol XL for rate control. Load with IV amiodarone. If Atrial flutter persists plan TEE guided DCCV early in the week.    Cardiac Studies   [facility] 11/04/2020 personally reviewed, no  significant CAD  11/04/2020 TTE 1.  There are severe global hypokinesis of the LV 2.  Overall left ventricular systolic function is severely impaired with an EF between 20 and 25% 3.  The right ventricle is normal in size and function 4.  The left atrium is normal in size 5.  The right atrium is normal in size and function 6.  There is mild aortic valve sclerosis 7.  Mild to moderate mitral regurgitation is present 8.  Trace tricuspid regurgitation present 9.  The pulmonic valve is normal 10.  There is no pericardial effusion   Assessment & Plan    Mr Hunter Miller is a 60yo man w/ hx of HTN, HLD who presented from [facility] with chest pain. [place] 11/04/2020 shows no significant CAD. Echo showed EF 20-25%.  #Chest pain - no significant CAD - ECG without ischemic signs  #AFL Variable AV conduction Likely contributing to LV dysfunction Restoration of  sinus rhythm will be important for the treatment of his LV dysfunction - NPO Sunday night for DCCV - TEE/DCCV Monday - heparin gtt for now  #Acute systolic HF Warm and dry. NYHA III. Likely tachy-mediated - TEE/DCCV Monday - cont losartan and metoprolol succinate - consider transition to entresto as outpatient - continue amiodarone for now, would like to avoid long term use - I will plan to see as outpatient to discuss ablation as a long term strategy to avoid amio    For questions or updates, please contact [place] Please consult [email or web address] for contact info under        Signed, Vickie Epley, MD  11/05/2020, 10:41 AM

## 2020-11-06 ENCOUNTER — Encounter (HOSPITAL_COMMUNITY): Payer: Self-pay | Admitting: Cardiology

## 2020-11-06 LAB — BASIC METABOLIC PANEL
Anion gap: 7 (ref 5–15)
BUN: 20 mg/dL (ref 8–23)
CO2: 26 mmol/L (ref 22–32)
Calcium: 9.3 mg/dL (ref 8.9–10.3)
Chloride: 100 mmol/L (ref 98–111)
Creatinine, Ser: 1.32 mg/dL — ABNORMAL HIGH (ref 0.61–1.24)
GFR, Estimated: >60 mL/min (ref >60)
Glucose, Bld: 106 mg/dL — ABNORMAL HIGH (ref 70–99)
Potassium: 3.4 mmol/L — ABNORMAL LOW (ref 3.5–5.1)
Sodium: 133 mmol/L — ABNORMAL LOW (ref 135–145)

## 2020-11-06 LAB — CBC
HCT: 40.1 % (ref 39.0–52.0)
Hemoglobin: 13.5 g/dL (ref 13.0–17.0)
MCH: 27.8 pg (ref 26.0–34.0)
MCHC: 33.7 g/dL (ref 30.0–36.0)
MCV: 82.5 fL (ref 80.0–100.0)
Platelets: 231 10*3/uL (ref 150–400)
RBC: 4.86 MIL/uL (ref 4.22–5.81)
RDW: 14.1 % (ref 11.5–15.5)
WBC: 7.9 10*3/uL (ref 4.0–10.5)
nRBC: 0 % (ref 0.0–0.2)

## 2020-11-06 MED ORDER — SACUBITRIL-VALSARTAN 24-26 MG PO TABS
1.0000 | ORAL_TABLET | Freq: Two times a day (BID) | ORAL | Status: DC
  Administered 2020-11-06 – 2020-11-07 (×3): 1 via ORAL
  Filled 2020-11-06 (×3): qty 1

## 2020-11-06 MED ORDER — POTASSIUM CHLORIDE CRYS ER 20 MEQ PO TBCR
40.0000 meq | EXTENDED_RELEASE_TABLET | Freq: Once | ORAL | Status: AC
  Administered 2020-11-06: 40 meq via ORAL
  Filled 2020-11-06: qty 2

## 2020-11-06 NOTE — Progress Notes (Signed)
Progress Note  Patient Name: Hunter Miller Date of Encounter: 11/06/2020  [facility] Cardiologist: None   Subjective   NAEO. Tele shows persistent AFL w/ variable conduction.  Inpatient Medications    Scheduled Meds:  apixaban  5 mg Oral BID   losartan  25 mg Oral q1800   metoprolol succinate  50 mg Oral q1800   pneumococcal 23 valent vaccine  0.5 mL Intramuscular Tomorrow-1000   potassium chloride  40 mEq Oral Once   sodium chloride flush  3 mL Intravenous Q12H   Continuous Infusions:  sodium chloride     amiodarone 30 mg/hr (11/05/20 2244)   PRN Meds: sodium chloride, acetaminophen, diphenhydrAMINE, ondansetron (ZOFRAN) IV, sodium chloride flush   Vital Signs    Vitals:   11/05/20 1723 11/05/20 2013 11/06/20 0025 11/06/20 0751  BP: (!) 109/97 115/83 (!) 117/98 125/80  Pulse: (!) 101 81 94 98  Resp:  17 17 18   Temp:  97.6 F (36.4 C) 97.6 F (36.4 C) 97.6 F (36.4 C)  TempSrc:  Oral Oral Oral  SpO2:  94% 96% 93%  Weight:      Height:       No intake or output data in the 24 hours ending 11/06/20 0809  Last 3 Weights 11/04/2020  Weight (lbs) 172 lb  Weight (kg) 78.019 kg      Telemetry    AFL with variable AV conduction.v rates consistently < 100 bpm.- Personally Reviewed  ECG    AFL - Personally Reviewed  Physical Exam   GEN: No acute distress.   Neck: No JVD Cardiac: irregularly irregular, no murmurs, rubs, or gallops.  Respiratory: Clear to auscultation bilaterally. GI: Soft, nontender, non-distended  MS: No edema; No deformity. Neuro:  Nonfocal  Psych: Normal affect   Labs    High Sensitivity Troponin:  No results for input(s): TROPONINIHS in the last 720 hours.    Chemistry Recent Labs  Lab 11/04/20 1539 11/04/20 1542 11/06/20 0123  NA 140 140  140 133*  K 4.0 4.1  4.1 3.4*  CL  --   --  100  CO2  --   --  26  GLUCOSE  --   --  106*  BUN  --   --  20  CREATININE  --   --  1.32*  CALCIUM  --   --  9.3  GFRNONAA  --    --  >60  ANIONGAP  --   --  7      Hematology Recent Labs  Lab 11/04/20 1542 11/05/20 0612 11/06/20 0123  WBC  --  9.7 7.9  RBC  --  5.05 4.86  HGB 14.6  14.6 13.7 13.5  HCT 43.0  43.0 41.7 40.1  MCV  --  82.6 82.5  MCH  --  27.1 27.8  MCHC  --  32.9 33.7  RDW  --  14.6 14.1  PLT  --  250 231     BNPNo results for input(s): BNP, PROBNP in the last 168 hours.   DDimer No results for input(s): DDIMER in the last 168 hours.   Radiology    CARDIAC CATHETERIZATION  Result Date: 11/04/2020 Formatting of this result is different from the original.   LV end diastolic pressure is normal. Very large and normal coronary arteries. Normal LV filling pressures. Normal right  heart pressures. Reduced cardiac output. Index 2.26. Plan: will resume IV heparin tonight with plans to initiate oral anticoagulation with Eliquis in am. Change ACEi to  losartan in anticipation of transitioning to [place] after 3 days. Start Toprol XL for rate control. Load with IV amiodarone. If Atrial flutter persists plan TEE guided DCCV early in the week.    Cardiac Studies   [facility] 11/04/2020 personally reviewed, no significant CAD  11/04/2020 TTE 1.  There are severe global hypokinesis of the LV 2.  Overall left ventricular systolic function is severely impaired with an EF between 20 and 25% 3.  The right ventricle is normal in size and function 4.  The left atrium is normal in size 5.  The right atrium is normal in size and function 6.  There is mild aortic valve sclerosis 7.  Mild to moderate mitral regurgitation is present 8.  Trace tricuspid regurgitation present 9.  The pulmonic valve is normal 10.  There is no pericardial effusion   Assessment & Plan    Hunter Miller is a 60yo man w/ hx of HTN, HLD who presented from [facility] with chest pain. [place] 11/04/2020 shows no significant CAD. Echo showed EF 20-25%.  #Chest pain - no significant CAD - ECG without ischemic signs  #AFL Variable  AV conduction Likely contributing to LV dysfunction Restoration of sinus rhythm will be important for the treatment of his LV dysfunction - NPO Sunday night for DCCV - TEE/DCCV Monday - heparin gtt for now  #Acute systolic HF Warm and dry. NYHA III. Likely tachy-mediated - HF team following - TEE/DCCV Monday - continue amiodarone for now, would like to avoid long term use - I will plan to see as outpatient to discuss ablation as a long term strategy to avoid amio    For questions or updates, please contact [place] Please consult [email or web address] for contact info under        Signed, Vickie Epley, MD  11/06/2020, 8:09 AM

## 2020-11-06 NOTE — Progress Notes (Signed)
Advanced Heart Failure Rounding Note   Subjective:    Received 1 dose IV lasix yesterday.  Says he is feeling better. I/Os and weight not documented.   Denies SOB or palpitations. Remains in AFL on IV amio      Objective:   Weight Range:  Vital Signs:   Temp:  [97.5 F (36.4 C)-98.3 F (36.8 C)] 97.6 F (36.4 C) (07/17 0751) Pulse Rate:  [62-101] 98 (07/17 0751) Resp:  [17-18] 18 (07/17 0751) BP: (98-125)/(75-98) 125/80 (07/17 0751) SpO2:  [93 %-99 %] 93 % (07/17 0751) Last BM Date: 11/03/20  Weight change: Filed Weights   11/04/20 1643  Weight: 78 kg    Intake/Output:  No intake or output data in the 24 hours ending 11/06/20 1134   Physical Exam: General:  Sitting in chair No resp difficulty HEENT: normal Neck: supple. no JVD. Carotids 2+ bilat; no bruits. No lymphadenopathy or thryomegaly appreciated. Cor: PMI nondisplaced. Irregular rate & rhythm. No rubs, gallops or murmurs. Lungs: clear Abdomen: soft, nontender, nondistended. No hepatosplenomegaly. No bruits or masses. Good bowel sounds. Extremities: no cyanosis, clubbing, rash, edema Neuro: alert & orientedx3, cranial nerves grossly intact. moves all 4 extremities w/o difficulty. Affect pleasant   Telemetry: AFL 80-100 Personally reviewed  Labs: Basic Metabolic Panel: Recent Labs  Lab 11/04/20 1539 11/04/20 1542 11/04/20 1729 11/06/20 0123  NA 140 140  140  --  133*  K 4.0 4.1  4.1  --  3.4*  CL  --   --   --  100  CO2  --   --   --  26  GLUCOSE  --   --   --  106*  BUN  --   --   --  20  CREATININE  --   --   --  1.32*  CALCIUM  --   --   --  9.3  MG  --   --  2.1  --     Liver Function Tests: No results for input(s): AST, ALT, ALKPHOS, BILITOT, PROT, ALBUMIN in the last 168 hours. No results for input(s): LIPASE, AMYLASE in the last 168 hours. No results for input(s): AMMONIA in the last 168 hours.  CBC: Recent Labs  Lab 11/04/20 1539 11/04/20 1542 11/05/20 0612  11/06/20 0123  WBC  --   --  9.7 7.9  HGB 14.3 14.6  14.6 13.7 13.5  HCT 42.0 43.0  43.0 41.7 40.1  MCV  --   --  82.6 82.5  PLT  --   --  250 231    Cardiac Enzymes: No results for input(s): CKTOTAL, CKMB, CKMBINDEX, TROPONINI in the last 168 hours.  BNP: BNP (last 3 results) No results for input(s): BNP in the last 8760 hours.  ProBNP (last 3 results) No results for input(s): PROBNP in the last 8760 hours.    Other results:  Imaging: CARDIAC CATHETERIZATION  Result Date: 11/04/2020 Formatting of this result is different from the original.   LV end diastolic pressure is normal. Very large and normal coronary arteries. Normal LV filling pressures. Normal right  heart pressures. Reduced cardiac output. Index 2.26. Plan: will resume IV heparin tonight with plans to initiate oral anticoagulation with Eliquis in am. Change ACEi to losartan in anticipation of transitioning to [place] after 3 days. Start Toprol XL for rate control. Load with IV amiodarone. If Atrial flutter persists plan TEE guided DCCV early in the week.     Medications:  Scheduled Medications:  apixaban  5 mg Oral BID   losartan  25 mg Oral q1800   metoprolol succinate  50 mg Oral q1800   pneumococcal 23 valent vaccine  0.5 mL Intramuscular Tomorrow-1000   sodium chloride flush  3 mL Intravenous Q12H    Infusions:  sodium chloride     amiodarone 30 mg/hr (11/05/20 2244)    PRN Medications: sodium chloride, acetaminophen, diphenhydrAMINE, ondansetron (ZOFRAN) IV, sodium chloride flush   Assessment/Plan:      1. Acute systolic HF - Echo LVEF 11-65% with normal left and right atrial size, mild aortic valve sclerosis with no stenosis, mild to moderate mitral regurgitation, trace tricuspid regurgitation and no pericardial effusion.  - Cath 7/16. 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  - Will switch losartan to Entresto  24/26 - Continue Toprol 50 daily  - Continue spiro 12.5 - Plan TEE/DC-CV tomorrow/Tuesday depending on schedule   2. Atrial flutter - Rate now controlled on IV amio and Toprol - Continue IV amio & Eliquis - Plan TEE/DC-CV tomorrow/Tuesday depending on schedule. Hold Toprol prior to DC-CV to reduce risk of severe bradycardia post DC-CV - EP following for outpatient ablation - Mild snoring. Will need PSG. Denies ETOH   3. HL  -Lipid panel, CHO-251, LDL-173 - consider statin     Length of Stay: 2   Glori Bickers MD 11/06/2020, 11:34 AM  Advanced Heart Failure Team Pager [phone] (M-F; 7a - 4p)  Please contact [facility] for night-coverage after hours (4p -7a ) and weekends on [email or web address]

## 2020-11-07 ENCOUNTER — Encounter (HOSPITAL_COMMUNITY): Payer: Self-pay | Admitting: Cardiology

## 2020-11-07 ENCOUNTER — Inpatient Hospital Stay (HOSPITAL_COMMUNITY): Payer: Medicaid Other | Admitting: Certified Registered Nurse Anesthetist

## 2020-11-07 ENCOUNTER — Inpatient Hospital Stay (HOSPITAL_COMMUNITY): Payer: Medicaid Other

## 2020-11-07 ENCOUNTER — Other Ambulatory Visit (HOSPITAL_COMMUNITY): Payer: Self-pay

## 2020-11-07 ENCOUNTER — Encounter (HOSPITAL_COMMUNITY)
Admission: RE | Disposition: A | Discharge status: Home / Self Care | Payer: Self-pay | Source: Other Acute Inpatient Hospital | Attending: Cardiology

## 2020-11-07 DIAGNOSIS — E785 Hyperlipidemia, unspecified: Secondary | ICD-10-CM

## 2020-11-07 DIAGNOSIS — I340 Nonrheumatic mitral (valve) insufficiency: Secondary | ICD-10-CM

## 2020-11-07 DIAGNOSIS — I4892 Unspecified atrial flutter: Secondary | ICD-10-CM

## 2020-11-07 HISTORY — PX: CARDIOVERSION: SHX1299

## 2020-11-07 HISTORY — PX: TEE WITHOUT CARDIOVERSION: SHX5443

## 2020-11-07 LAB — BASIC METABOLIC PANEL
Anion gap: 9 (ref 5–15)
BUN: 19 mg/dL (ref 8–23)
CO2: 24 mmol/L (ref 22–32)
Calcium: 9.4 mg/dL (ref 8.9–10.3)
Chloride: 105 mmol/L (ref 98–111)
Creatinine, Ser: 1.23 mg/dL (ref 0.61–1.24)
GFR, Estimated: >60 mL/min (ref >60)
Glucose, Bld: 103 mg/dL — ABNORMAL HIGH (ref 70–99)
Potassium: 4 mmol/L (ref 3.5–5.1)
Sodium: 138 mmol/L (ref 135–145)

## 2020-11-07 LAB — CBC
HCT: 44.2 % (ref 39.0–52.0)
Hemoglobin: 14.6 g/dL (ref 13.0–17.0)
MCH: 27.1 pg (ref 26.0–34.0)
MCHC: 33 g/dL (ref 30.0–36.0)
MCV: 82.2 fL (ref 80.0–100.0)
Platelets: 235 10*3/uL (ref 150–400)
RBC: 5.38 MIL/uL (ref 4.22–5.81)
RDW: 14.1 % (ref 11.5–15.5)
WBC: 8.2 10*3/uL (ref 4.0–10.5)
nRBC: 0 % (ref 0.0–0.2)

## 2020-11-07 LAB — PROTIME-INR
INR: 1.1 (ref 0.8–1.2)
Prothrombin Time: 14.5 seconds (ref 11.4–15.2)

## 2020-11-07 LAB — HEMOGLOBIN A1C
Hgb A1c MFr Bld: 6.5 % — ABNORMAL HIGH (ref 4.8–5.6)
Mean Plasma Glucose: 139.85 mg/dL

## 2020-11-07 SURGERY — ECHOCARDIOGRAM, TRANSESOPHAGEAL
Anesthesia: General

## 2020-11-07 MED ORDER — DEXMEDETOMIDINE (PRECEDEX) IN NS 20 MCG/5ML (4 MCG/ML) IV SYRINGE
PREFILLED_SYRINGE | INTRAVENOUS | Status: DC | PRN
  Administered 2020-11-07: 8 ug via INTRAVENOUS

## 2020-11-07 MED ORDER — PROPOFOL 10 MG/ML IV BOLUS
INTRAVENOUS | Status: DC | PRN
  Administered 2020-11-07: 20 mg via INTRAVENOUS

## 2020-11-07 MED ORDER — SODIUM CHLORIDE 0.9 % IV SOLN: INTRAVENOUS | Status: DC | PRN

## 2020-11-07 MED ORDER — PHENYLEPHRINE HCL (PRESSORS) 10 MG/ML IV SOLN
INTRAVENOUS | Status: DC | PRN
  Administered 2020-11-07 (×4): 80 ug via INTRAVENOUS

## 2020-11-07 MED ORDER — SODIUM CHLORIDE 0.9 % IV SOLN: INTRAVENOUS | Status: DC

## 2020-11-07 MED ORDER — PROPOFOL 500 MG/50ML IV EMUL
INTRAVENOUS | Status: DC | PRN
  Administered 2020-11-07: 40 ug/kg/min via INTRAVENOUS

## 2020-11-07 MED ORDER — PHENYLEPHRINE HCL-NACL 10-0.9 MG/250ML-% IV SOLN
INTRAVENOUS | Status: DC | PRN
  Administered 2020-11-07: 25 ug/min via INTRAVENOUS

## 2020-11-07 MED ORDER — SPIRONOLACTONE 12.5 MG HALF TABLET
12.5000 mg | ORAL_TABLET | Freq: Every day | ORAL | Status: DC
  Administered 2020-11-07 – 2020-11-09 (×3): 12.5 mg via ORAL
  Filled 2020-11-07 (×3): qty 1

## 2020-11-07 MED ORDER — ATORVASTATIN CALCIUM 40 MG PO TABS
40.0000 mg | ORAL_TABLET | Freq: Every day | ORAL | Status: DC
  Administered 2020-11-07 – 2020-11-09 (×3): 40 mg via ORAL
  Filled 2020-11-07 (×4): qty 1

## 2020-11-07 MED ORDER — AMIODARONE HCL 200 MG PO TABS
400.0000 mg | ORAL_TABLET | Freq: Two times a day (BID) | ORAL | Status: DC
  Administered 2020-11-07 – 2020-11-09 (×4): 400 mg via ORAL
  Filled 2020-11-07 (×4): qty 2

## 2020-11-07 NOTE — Plan of Care (Signed)
  Problem: Activity: Goal: Ability to return to baseline activity level will improve Outcome: Progressing   Problem: Health Behavior/Discharge Planning: Goal: Ability to safely manage health-related needs after discharge will improve Outcome: Progressing   Problem: Cardiovascular: Goal: Ability to achieve and maintain adequate cardiovascular perfusion will improve Outcome: Completed/Met Goal: Vascular access site(s) Level 0-1 will be maintained Outcome: Completed/Met

## 2020-11-07 NOTE — Interval H&P Note (Signed)
History and Physical Interval Note:  11/07/2020 11:48 AM  Hunter Miller  has presented today for surgery, with the diagnosis of afib.  The various methods of treatment have been discussed with the patient and family. After consideration of risks, benefits and other options for treatment, the patient has consented to  Procedure(s): TRANSESOPHAGEAL ECHOCARDIOGRAM (TEE) (N/A) CARDIOVERSION (N/A) as a surgical intervention.  The patient's history has been reviewed, patient examined, no change in status, stable for surgery.  I have reviewed the patient's chart and labs.  Questions were answered to the patient's satisfaction.     [name]

## 2020-11-07 NOTE — Progress Notes (Addendum)
Advanced Heart Failure Rounding Note   Subjective:    Feeling well this am. Laying flat w/o dyspnea. Denies CP. Remains in AFL CVR 90s. Daughter present at bedside.    Objective:   Weight Range:  Vital Signs:   Temp:  [97.5 F (36.4 C)-98.6 F (37 C)] 98.6 F (37 C) (07/18 0511) Pulse Rate:  [65-103] 97 (07/18 0511) Resp:  [16-18] 18 (07/18 0511) BP: (105-135)/(78-100) 105/86 (07/18 0511) SpO2:  [94 %-98 %] 98 % (07/18 0511) Weight:  [79.6 kg] 79.6 kg (07/18 0511) Last BM Date: 11/06/20  Weight change: Filed Weights   11/04/20 1643 11/07/20 0511  Weight: 78 kg 79.6 kg    Intake/Output:   Intake/Output Summary (Last 24 hours) at 11/07/2020 0940 Last data filed at 11/06/2020 1218 Gross per 24 hour  Intake 120 ml  Output --  Net 120 ml    PHYSICAL EXAM: General:  Well appearing. No respiratory difficulty HEENT: normal Neck: supple. no JVD. Carotids 2+ bilat; no bruits. No lymphadenopathy or thyromegaly appreciated. Cor: PMI nondisplaced. Irregular rhythm and rate. No rubs, gallops or murmurs. Lungs: clear Abdomen: soft, nontender, nondistended. No hepatosplenomegaly. No bruits or masses. Good bowel sounds. Extremities: no cyanosis, clubbing, rash, edema Neuro: alert & oriented x 3, cranial nerves grossly intact. moves all 4 extremities w/o difficulty. Affect pleasant.    Telemetry: AFL 90s Personally reviewed  Labs: Basic Metabolic Panel: Recent Labs  Lab 11/04/20 1539 11/04/20 1542 11/04/20 1729 11/06/20 0123 11/07/20 0130  NA 140 140  140  --  133* 138  K 4.0 4.1  4.1  --  3.4* 4.0  CL  --   --   --  100 105  CO2  --   --   --  26 24  GLUCOSE  --   --   --  106* 103*  BUN  --   --   --  20 19  CREATININE  --   --   --  1.32* 1.23  CALCIUM  --   --   --  9.3 9.4  MG  --   --  2.1  --   --     Liver Function Tests: No results for input(s): AST, ALT, ALKPHOS, BILITOT, PROT, ALBUMIN in the last 168 hours. No results for input(s): LIPASE,  AMYLASE in the last 168 hours. No results for input(s): AMMONIA in the last 168 hours.  CBC: Recent Labs  Lab 11/04/20 1539 11/04/20 1542 11/05/20 0612 11/06/20 0123 11/07/20 0130  WBC  --   --  9.7 7.9 8.2  HGB 14.3 14.6  14.6 13.7 13.5 14.6  HCT 42.0 43.0  43.0 41.7 40.1 44.2  MCV  --   --  82.6 82.5 82.2  PLT  --   --  250 231 235    Cardiac Enzymes: No results for input(s): CKTOTAL, CKMB, CKMBINDEX, TROPONINI in the last 168 hours.  BNP: BNP (last 3 results) No results for input(s): BNP in the last 8760 hours.  ProBNP (last 3 results) No results for input(s): PROBNP in the last 8760 hours.    Other results:  Imaging: No results found.   Medications:     Scheduled Medications:  apixaban  5 mg Oral BID   metoprolol succinate  50 mg Oral q1800   pneumococcal 23 valent vaccine  0.5 mL Intramuscular Tomorrow-1000   sacubitril-valsartan  1 tablet Oral BID   sodium chloride flush  3 mL Intravenous Q12H    Infusions:  sodium chloride     amiodarone 30 mg/hr (11/07/20 0334)    PRN Medications: sodium chloride, acetaminophen, diphenhydrAMINE, ondansetron (ZOFRAN) IV, sodium chloride flush   Assessment/Plan:      1. Acute systolic HF - Echo LVEF 34-74% with normal left and right atrial size, mild aortic valve sclerosis with no stenosis, mild to moderate mitral regurgitation, trace tricuspid regurgitation and no pericardial effusion.  - Cath 7/16. 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  - Continue Entresto 24/26 - Continue Toprol 50 daily (hold pre-DCCV to reduce bradycardia) - Add spiro 12.5 qhs  - Plan TEE/DC-CV today    2. Atrial flutter - Rate now controlled on IV amio and Toprol - Continue IV amio & Eliquis - Plan TEE/DC-CV today. Hold Toprol prior to DC-CV to reduce risk of severe bradycardia post DC-CV - EP following for outpatient ablation - Mild snoring. Will  need PSG. Denies ETOH   3. HL  -Lipid panel, CHO-251, LDL-173 - recommend statin, atorva 40   4. Pre-diabetes - Hgb A1c 6.1 a year ago  - repeat Hgb A1c    Length of Stay: 3   Brittainy Simmons PA-C  11/07/2020, 9:40 AM  Advanced Heart Failure Team Pager [phone] (M-F; 7a - 4p)  Please contact [facility] for night-coverage after hours (4p -7a ) and weekends on [email or web address]  Patient seen and examined with the above-signed Advanced Practice Provider and/or Housestaff. I personally reviewed laboratory data, imaging studies and relevant notes. I independently examined the patient and formulated the important aspects of the plan. I have edited the note to reflect any of my changes or salient points. I have personally discussed the plan with the patient and/or family.  Feels good. No CP, orthopnea or PND. Remains in AFL. Rate controlled on IV amio. No bleeding on Eliquis.  General:  Well appearing. No resp difficulty HEENT: normal Neck: supple. no JVD. Carotids 2+ bilat; no bruits. No lymphadenopathy or thryomegaly appreciated. Cor: PMI nondisplaced. Irregular rate & rhythm. No rubs, gallops or murmurs. Lungs: clear Abdomen: soft, nontender, nondistended. No hepatosplenomegaly. No bruits or masses. Good bowel sounds. Extremities: no cyanosis, clubbing, rash, edema Neuro: alert & orientedx3, cranial nerves grossly intact. moves all 4 extremities w/o difficulty. Affect pleasant  Improved from HF perspective. Remains in AFL. Rate improved on IV amio.   For TEE/DC-CV today.   Glori Bickers, MD  11:00 AM

## 2020-11-07 NOTE — Discharge Summary (Addendum)
Advanced Heart Failure Team  Discharge Summary   Patient ID: Hunter Miller MRN: [id number], DOB/AGE: 1960-07-24 60 y.o. Admit date: 11/04/2020 D/C date:     11/10/2020   Primary Discharge Diagnoses:  Acute systolic CHF Nonischemic tachycardia induced cardiomyopathy Atrial flutter with RVR Hyperlipidemia Hypertension Type II Diabetes Dizziness Pituitary macroadenoma  Hospital Course:  Hunter Miller is a 60 year old male with history of possible right femoral artery versus cerebral angiogram in remote past (details not known), HTN, HTLD. He initially presented to [facility] with chest pain. Echo with EF of 20-25%, mild aortic valve sclerosis with no stenosis, mild to moderate mitral regurgitation, trace tricuspid regurgitation and no pericardial effusion (per chart review, echo report not available). Found to be in atrial flutter with RVR.   He was transferred to [facility] on 11/04/2020 for [facility] and possible cardioversion. Cath 07/15 with normal coronaries, normal LV filling pressures, normal right heart pressures, reduced CO, CI 2.26.  Initiated on GDMT with entresto after ACEi washout, Toprol xl and spiro. Started anticoagulation with Eliquis amd loaded with IV amiodarone.  Underwent successful TEE guided DCCV on 07/18. He was stable following the procedure. Had planned to discharge home on afternoon of 07/18 but he was kept for monitoring due to significant dizziness. CT head w/o contrast on 07/19 demonstrated 3 cm sellar/suprasellar mass. 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 He was evaluated by Neurology and Neurosurgery. No acute intervention felt to be warranted. Plan is for outpatient follow-up with Neurosurgery. See below for hospital course by problem.  1. Acute systolic HF - Echo on 7/40/8144: LVEF 20-25% with normal left and right atrial size, mild aortic valve sclerosis with no stenosis, mild to moderate mitral regurgitation, trace tricuspid regurgitation and  no pericardial effusion.  - Cath on 7/16: No CAD  Normal LVEDP. 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 - S/p TEE guided cardioversion 07/18 - Diuresed with 40 mg lasix IV once. - Appears euvolemic. Not requiring loop diuretic. - Complaining of lightheadedness and had soft BP 07/18. Held entresto. Orthostatics negative. No change in symptoms after holding med.  - BP stable today. Adding losartan 25 mg daily at D/C. Consider switching to entresto at f/u. - Continue Toprol 50 daily  - Continue Spiro 12.5 mg daily - No SGLT2i for now - consider adding as outpatient - Will need repeat echo in 4-6 weeks to reassess LVEF after restoration of sinus rhythm. - F/u HF clinic scheduled    2. Atrial flutter with RVR -New diagnosis this admission. - s/p DCCV on 7/18 - maintaining sinus rhythm.  -Moderate to severe LAE on TEE. -Anticoagulation with Eliquis 5 mg BID. Will need to remain on anticoagulation for at least three weeks post-DCCV unless emergent need for surgical or other intervention. -Loaded with IV amiodarone. S/p TEE/DC-CV 11/07/2020. Maintaining sinus rhythm. -D/C home on amiodarone 200 mg BID X 5 days followed by 200 mg daily -Continue Toprol 50 mg daily. -To see EP in f/u as outpatient for consideration of ablation. F/u scheduled. -Mild snoring. Sleep study as outpatient. -Denies ETOH. -TSH normal.    3. Hyperlipidemia - Lipid panel, CHO-251, LDL-173. - Continue Lipitor - Needs FLP and AST/ALT in 6-8 weeks.   4. Diabetes, type II - A1c 6.5% - PCP f/u scheduled   5. HTN -BP stable. -Continue Toprol-XL and Spiro. Losartan added.   6. Dizziness/possible syncope -Possible LOC eported on 07/11 -Persistent dizziness despite addition of PRN meclizine  and permissive hypertension -Hgb stable -Bradycardia reported at outside clinic (no ECG available). No significant pauses or bradycardia identified during this hospitalization. -Suspect may be  related to oral amiodarone - dose was decreased. -No embolic event on neuroimaging.   7. 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 Appreciate input. FSH, LH, prolactin, ACTH, urine free cortisol and insulin-like growth factor ordered. Elevated FSH and prolactin. Will refer to endocrine as outpatient. -Evaluated by Neurosurgery - will follow-up in clinic in 6 weeks -F/u with ophthalmology for visual field evaluation - scheduled    CONSULTS -Pharmacist -[facility] -EP   Discharge plan reviewed with Dr. Jeffie Pollock, who agreed that patient was stable to discharge to home today.  Discharge Weight Range: 172 lb > 175 lb Discharge Vitals: Blood pressure 115/84, pulse (!) 56, temperature 98.5 F (36.9 C), temperature source Oral, resp. rate 17, height 5\' 6"  (1.676 m), weight 76.7 kg, SpO2 95 %.  Labs: Lab Results  Component Value Date   WBC 6.5 11/10/2020   HGB 14.0 11/10/2020   HCT 41.9 11/10/2020   MCV 82.5 11/10/2020   PLT 236 11/10/2020    Recent Labs  Lab 11/10/20 0259  NA 135  K 4.4  CL 102  CO2 24  BUN 21  CREATININE 1.32*  CALCIUM 9.4  GLUCOSE 88   No results found for: CHOL, HDL, LDLCALC, TRIG BNP (last 3 results) No results for input(s): BNP in the last 8760 hours.  ProBNP (last 3 results) No results for input(s): PROBNP in the last 8760 hours.   Diagnostic Studies/Procedures   MRI brain, 11/09/2020: IMPRESSION: 29 x 22 x 21 mm pituitary macro adenoma with expanded sella and chiasmatic compression.  CT head, 11/09/2019: IMPRESSION: 1. 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 Pituitary protocol brain MRI (without and with contrast) is recommended for further evaluation. 2. No evidence of acute intracranial abnormality.  TEE 11/07/2020: LEFT VENTRICLE: EF = 20-25% Global HK RIGHT VENTRICLE: Moderate HK LEFT ATRIUM: Moderate to severely dilated LA diameter 5.0 cm LEFT ATRIAL APPENDAGE: No Clot RIGHT  ATRIUM: Normal  AORTIC VALVE:  Trileaflet. No AI/AS MITRAL VALVE:    Normal mild to moderate MR TRICUSPID VALVE: Normal mild TR PULMONIC VALVE: Normal No PR INTERATRIAL SEPTUM: No PFO or ASD PERICARDIUM: No effusion DESCENDING AORTA: Mild plaque   R/LHC, 11/04/2020: .  LV end diastolic pressure is normal.  Very large and normal coronary arteries. Normal LV filling pressures. Normal right  heart pressures. Reduced cardiac output. Index 2.26.  Echocardiogram at [facility]:  Severe global hypokinesis of LV with EF 20 to 25%, normal left and right atrial size, mild aortic valve sclerosis with no stenosis, mild to moderate mitral regurgitation, trace tricuspid regurgitation and no pericardial effusion.   Discharge Medications   Allergies as of 11/10/2020   No Known Allergies      Medication List     TAKE these medications    amiodarone 200 MG tablet Commonly known as: PACERONE Take 1 tablet twice a day for 5 days, then decrease to 1 tablet daily   apixaban 5 MG Tabs tablet Commonly known as: ELIQUIS Take 1 tablet (5 mg total) by mouth 2 (two) times daily.   atorvastatin 40 MG tablet Commonly known as: LIPITOR Take 1 tablet (40 mg total) by mouth daily at 6 PM.   losartan 25 MG tablet Commonly known as: Cozaar Take 1 tablet (25 mg total) by mouth daily.   metoprolol succinate 50 MG 24 hr tablet  Commonly known as: TOPROL-XL Take 1 tablet (50 mg total) by mouth daily at 6 PM. Take with or immediately following a meal.   spironolactone 25 MG tablet Commonly known as: ALDACTONE Take 0.5 tablets (12.5 mg total) by mouth at bedtime.        Disposition   The patient will be discharged in stable condition to home. Discharge Instructions     (HEART FAILURE PATIENTS) Call MD:  Anytime you have any of the following symptoms: 1) 3 pound weight gain in 24 hours or 5 pounds in 1 week 2) shortness of breath, with or without a dry hacking cough 3) swelling in the hands, feet  or stomach 4) if you have to sleep on extra pillows at night in order to breathe.   Complete by: As directed    Diet - low sodium heart healthy   Complete by: As directed    Heart Failure patients record your daily weight using the same scale at the same time of day   Complete by: As directed    Increase activity slowly   Complete by: As directed        Follow-up Information     Vickie Epley, MD Follow up.   Specialties: Cardiology, Radiology Why: on 8/5 at 3 pm for hospital follow up Contact information: [place] [address] [place] Follow up on 11/22/2020.   Specialty: Cardiology Why: Heart Failure Clinic at 10:30 am Entrance C  Garage Code [id number] Contact information: [address] [id number] [place]        Gala Lewandowsky, MD. Go in 7 day(s).   Specialty: Family Medicine Why: Tuesday 11/15/2020 at 3pm for a hospital follow up with Dr. Adelene Amas information: [address] [place] [address] [phone]         [facility] Go on 11/21/2020.   Why: Appointment for vision check at 1:45 p.m. Contact information: [address] [place] [address] [phone]         Judith Part, MD. Schedule an appointment as soon as possible for a visit.   Specialty: Neurosurgery Contact information: [place] [address] [phone]                   Duration of Discharge Encounter: Greater than 35 minutes   Signed, Leata Mouse  11/10/2020, 4:02 PM   Patient seen and examined with the above-signed Advanced Practice Provider and/or Housestaff. I personally reviewed laboratory data, imaging studies and relevant notes. I independently examined the patient and formulated the important aspects of the plan. I have edited the note to reflect any of my changes  or salient points. I have personally discussed the plan with the patient and/or family.  Stable from HF perspective. Maintaining NSR. Dizziness stable.   [place] for d/c today with outpatient f/u.   Glori Bickers, MD  5:55 PM

## 2020-11-07 NOTE — Anesthesia Postprocedure Evaluation (Signed)
Anesthesia Post Note  Patient: Hunter Miller  Procedure(s) Performed: TRANSESOPHAGEAL ECHOCARDIOGRAM (TEE) CARDIOVERSION     Patient location during evaluation: Endoscopy Anesthesia Type: General Level of consciousness: awake and alert Pain management: pain level controlled Vital Signs Assessment: post-procedure vital signs reviewed and stable Respiratory status: spontaneous breathing, nonlabored ventilation and respiratory function stable Cardiovascular status: blood pressure returned to baseline and stable Postop Assessment: no apparent nausea or vomiting Anesthetic complications: no   No notable events documented.  Last Vitals:  Vitals:   11/07/20 1303 11/07/20 1328  BP: (!) 89/66 103/81  Pulse: (!) 50 (!) 51  Resp: 14 14  Temp:  (!) 36.4 C  SpO2: 94% 99%    Last Pain:  Vitals:   11/07/20 1328  TempSrc: Oral  PainSc:                  Takiera Mayo,W. EDMOND

## 2020-11-07 NOTE — Anesthesia Preprocedure Evaluation (Addendum)
Anesthesia Evaluation  Patient identified by MRN, date of birth, ID band Patient awake    Reviewed: Allergy & Precautions, NPO status , Patient's Chart, lab work & pertinent test results, reviewed documented beta blocker date and time   History of Anesthesia Complications Negative for: history of anesthetic complications  Airway Mallampati: II  TM Distance: >3 FB Neck ROM: Full    Dental  (+) Dental Advisory Given, Lower Dentures, Chipped   Pulmonary neg pulmonary ROS,    Pulmonary exam normal        Cardiovascular hypertension, Pt. on medications and Pt. on home beta blockers +CHF  + dysrhythmias Atrial Fibrillation + Valvular Problems/Murmurs MR  Rhythm:Irregular Rate:Tachycardia   '22 Cath - 1.Very large and normal coronary arteries. 2.Normal LV filling pressures. 3.Normal right  heart pressures. 4.Reduced cardiac output. Index 2.26.  Per Dr. Letha Cape note - Echo LVEF 20-25%, mild aortic valve sclerosis with no stenosis, mild to moderate mitral regurgitation, trace tricuspid regurgitation    Neuro/Psych negative neurological ROS  negative psych ROS   GI/Hepatic negative GI ROS, Neg liver ROS,   Endo/Other  negative endocrine ROS  Renal/GU negative Renal ROS     Musculoskeletal negative musculoskeletal ROS (+)   Abdominal   Peds  Hematology negative hematology ROS (+)   Anesthesia Other Findings   Reproductive/Obstetrics                            Anesthesia Physical Anesthesia Plan  ASA: 3  Anesthesia Plan: General   Post-op Pain Management:    Induction: Intravenous  PONV Risk Score and Plan: 2 and Treatment may vary due to age or medical condition and Propofol infusion  Airway Management Planned: Natural Airway and Nasal Cannula  Additional Equipment: None  Intra-op Plan:   Post-operative Plan:   Informed Consent: I have reviewed the patients History and  Physical, chart, labs and discussed the procedure including the risks, benefits and alternatives for the proposed anesthesia with the patient or authorized representative who has indicated his/her understanding and acceptance.       Plan Discussed with: CRNA and Anesthesiologist  Anesthesia Plan Comments: (May begin procedure as MAC with conversion to GA as indicated by procedure )       Anesthesia Quick Evaluation

## 2020-11-07 NOTE — CV Procedure (Signed)
   TRANSESOPHAGEAL ECHOCARDIOGRAM GUIDED DIRECT CURRENT CARDIOVERSION  NAME:  Hunter Miller   MRN: [id number] DOB:  Feb 08, 1961   ADMIT DATE: 11/04/2020  INDICATIONS:  Atrial flutter  PROCEDURE:   Informed consent was obtained prior to the procedure. The risks, benefits and alternatives for the procedure were discussed and the patient comprehended these risks.  Risks include, but are not limited to, cough, sore throat, vomiting, nausea, somnolence, esophageal and stomach trauma or perforation, bleeding, low blood pressure, aspiration, pneumonia, infection, trauma to the teeth and death.    After a procedural time-out, the oropharynx was anesthetized and the patient was sedated by the anesthesia service. The transesophageal probe was inserted in the esophagus and stomach without difficulty and multiple views were obtained.   FINDINGS:  LEFT VENTRICLE: EF = 20-25% Global HK  RIGHT VENTRICLE: Moderate HK  LEFT ATRIUM: Moderate to severely dilated LA diameter 5.0 cm  LEFT ATRIAL APPENDAGE: No Clot  RIGHT ATRIUM: Normal   AORTIC VALVE:  Trileaflet. No AI/AS  MITRAL VALVE:    Normal mild to moderate MR  TRICUSPID VALVE: Normal mild TR  PULMONIC VALVE: Normal No PR  INTERATRIAL SEPTUM: No PFO or ASD  PERICARDIUM: No effusion  DESCENDING AORTA: Mild plaque   CARDIOVERSION:     Indications:  Atrial Flutter  Procedure Details:  Once the TEE was complete, the patient had the defibrillator pads placed in the anterior and posterior position. Once an appropriate level of sedation was achieved, the patient received a single biphasic, synchronized 200J shock with prompt conversion to sinus rhythm. No apparent complications.   Glori Bickers, MD  1:31 PM

## 2020-11-07 NOTE — H&P (View-Only) (Signed)
Advanced Heart Failure Rounding Note   Subjective:    Feeling well this am. Laying flat w/o dyspnea. Denies CP. Remains in AFL CVR 90s. Daughter present at bedside.    Objective:   Weight Range:  Vital Signs:   Temp:  [97.5 F (36.4 C)-98.6 F (37 C)] 98.6 F (37 C) (07/18 0511) Pulse Rate:  [65-103] 97 (07/18 0511) Resp:  [16-18] 18 (07/18 0511) BP: (105-135)/(78-100) 105/86 (07/18 0511) SpO2:  [94 %-98 %] 98 % (07/18 0511) Weight:  [79.6 kg] 79.6 kg (07/18 0511) Last BM Date: 11/06/20  Weight change: Filed Weights   11/04/20 1643 11/07/20 0511  Weight: 78 kg 79.6 kg    Intake/Output:   Intake/Output Summary (Last 24 hours) at 11/07/2020 0940 Last data filed at 11/06/2020 1218 Gross per 24 hour  Intake 120 ml  Output --  Net 120 ml    PHYSICAL EXAM: General:  Well appearing. No respiratory difficulty HEENT: normal Neck: supple. no JVD. Carotids 2+ bilat; no bruits. No lymphadenopathy or thyromegaly appreciated. Cor: PMI nondisplaced. Irregular rhythm and rate. No rubs, gallops or murmurs. Lungs: clear Abdomen: soft, nontender, nondistended. No hepatosplenomegaly. No bruits or masses. Good bowel sounds. Extremities: no cyanosis, clubbing, rash, edema Neuro: alert & oriented x 3, cranial nerves grossly intact. moves all 4 extremities w/o difficulty. Affect pleasant.    Telemetry: AFL 90s Personally reviewed  Labs: Basic Metabolic Panel: Recent Labs  Lab 11/04/20 1539 11/04/20 1542 11/04/20 1729 11/06/20 0123 11/07/20 0130  NA 140 140  140  --  133* 138  K 4.0 4.1  4.1  --  3.4* 4.0  CL  --   --   --  100 105  CO2  --   --   --  26 24  GLUCOSE  --   --   --  106* 103*  BUN  --   --   --  20 19  CREATININE  --   --   --  1.32* 1.23  CALCIUM  --   --   --  9.3 9.4  MG  --   --  2.1  --   --     Liver Function Tests: No results for input(s): AST, ALT, ALKPHOS, BILITOT, PROT, ALBUMIN in the last 168 hours. No results for input(s): LIPASE,  AMYLASE in the last 168 hours. No results for input(s): AMMONIA in the last 168 hours.  CBC: Recent Labs  Lab 11/04/20 1539 11/04/20 1542 11/05/20 0612 11/06/20 0123 11/07/20 0130  WBC  --   --  9.7 7.9 8.2  HGB 14.3 14.6  14.6 13.7 13.5 14.6  HCT 42.0 43.0  43.0 41.7 40.1 44.2  MCV  --   --  82.6 82.5 82.2  PLT  --   --  250 231 235    Cardiac Enzymes: No results for input(s): CKTOTAL, CKMB, CKMBINDEX, TROPONINI in the last 168 hours.  BNP: BNP (last 3 results) No results for input(s): BNP in the last 8760 hours.  ProBNP (last 3 results) No results for input(s): PROBNP in the last 8760 hours.    Other results:  Imaging: No results found.   Medications:     Scheduled Medications:  apixaban  5 mg Oral BID   metoprolol succinate  50 mg Oral q1800   pneumococcal 23 valent vaccine  0.5 mL Intramuscular Tomorrow-1000   sacubitril-valsartan  1 tablet Oral BID   sodium chloride flush  3 mL Intravenous Q12H    Infusions:  sodium chloride     amiodarone 30 mg/hr (11/07/20 0334)    PRN Medications: sodium chloride, acetaminophen, diphenhydrAMINE, ondansetron (ZOFRAN) IV, sodium chloride flush   Assessment/Plan:      1. Acute systolic HF - Echo LVEF 16-10% with normal left and right atrial size, mild aortic valve sclerosis with no stenosis, mild to moderate mitral regurgitation, trace tricuspid regurgitation and no pericardial effusion.  - Cath 7/16. 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  - Continue Entresto 24/26 - Continue Toprol 50 daily (hold pre-DCCV to reduce bradycardia) - Add spiro 12.5 qhs  - Plan TEE/DC-CV today    2. Atrial flutter - Rate now controlled on IV amio and Toprol - Continue IV amio & Eliquis - Plan TEE/DC-CV today. Hold Toprol prior to DC-CV to reduce risk of severe bradycardia post DC-CV - EP following for outpatient ablation - Mild snoring. Will  need PSG. Denies ETOH   3. HL  -Lipid panel, CHO-251, LDL-173 - recommend statin, atorva 40   4. Pre-diabetes - Hgb A1c 6.1 a year ago  - repeat Hgb A1c    Length of Stay: 3   Brittainy Simmons PA-C  11/07/2020, 9:40 AM  Advanced Heart Failure Team Pager [phone] (M-F; 7a - 4p)  Please contact [facility] for night-coverage after hours (4p -7a ) and weekends on [email or web address]  Patient seen and examined with the above-signed Advanced Practice Provider and/or Housestaff. I personally reviewed laboratory data, imaging studies and relevant notes. I independently examined the patient and formulated the important aspects of the plan. I have edited the note to reflect any of my changes or salient points. I have personally discussed the plan with the patient and/or family.  Feels good. No CP, orthopnea or PND. Remains in AFL. Rate controlled on IV amio. No bleeding on Eliquis.  General:  Well appearing. No resp difficulty HEENT: normal Neck: supple. no JVD. Carotids 2+ bilat; no bruits. No lymphadenopathy or thryomegaly appreciated. Cor: PMI nondisplaced. Irregular rate & rhythm. No rubs, gallops or murmurs. Lungs: clear Abdomen: soft, nontender, nondistended. No hepatosplenomegaly. No bruits or masses. Good bowel sounds. Extremities: no cyanosis, clubbing, rash, edema Neuro: alert & orientedx3, cranial nerves grossly intact. moves all 4 extremities w/o difficulty. Affect pleasant  Improved from HF perspective. Remains in AFL. Rate improved on IV amio.   For TEE/DC-CV today.   Glori Bickers, MD  11:00 AM

## 2020-11-07 NOTE — Progress Notes (Signed)
  Echocardiogram Echocardiogram Transesophageal has been performed.  Hunter Miller 11/07/2020, 12:38 PM

## 2020-11-07 NOTE — Anesthesia Procedure Notes (Signed)
Procedure Name: MAC Date/Time: 11/07/2020 12:05 PM Performed by: Fulton Reek, CRNA Pre-anesthesia Checklist: Patient identified, Emergency Drugs available, Suction available, Patient being monitored and Timeout performed Patient Re-evaluated:Patient Re-evaluated prior to induction Oxygen Delivery Method: Nasal cannula Preoxygenation: Pre-oxygenation with 100% oxygen Induction Type: IV induction Dental Injury: Teeth and Oropharynx as per pre-operative assessment

## 2020-11-07 NOTE — Progress Notes (Signed)
Patient seen post-cardioversion. Reporting lightheadedness after medications as well as following today's procedures. No presyncoe or syncope.   BP as low as 84Y/65L systolic but improved on later recheck. Rhythm is sinus, rate 60s.  Will hold entresto. Check orthostatics.  Will keep overnight for additional monitoring.

## 2020-11-07 NOTE — Transfer of Care (Signed)
Immediate Anesthesia Transfer of Care Note  Patient: Brodey Bonn  Procedure(s) Performed: TRANSESOPHAGEAL ECHOCARDIOGRAM (TEE) CARDIOVERSION  Patient Location: Endoscopy Unit  Anesthesia Type:MAC  Level of Consciousness: drowsy  Airway & Oxygen Therapy: Patient Spontanous Breathing and Patient connected to nasal cannula oxygen  Post-op Assessment: Report given to RN and Post -op Vital signs reviewed and stable  Post vital signs: Reviewed and stable  Last Vitals:  Vitals Value Taken Time  BP 91/62 11/07/20 1232  Temp 36.4 C 11/07/20 1232  Pulse 53 11/07/20 1234  Resp 21 11/07/20 1234  SpO2 95 % 11/07/20 1234  Vitals shown include unvalidated device data.  Last Pain:  Vitals:   11/07/20 1232  TempSrc: Temporal  PainSc: Asleep      Patients Stated Pain Goal: 0 (97/02/63 7858)  Complications: No notable events documented.

## 2020-11-08 ENCOUNTER — Other Ambulatory Visit (HOSPITAL_COMMUNITY): Payer: Self-pay

## 2020-11-08 ENCOUNTER — Encounter (HOSPITAL_COMMUNITY): Payer: Self-pay | Admitting: Internal Medicine

## 2020-11-08 ENCOUNTER — Inpatient Hospital Stay (HOSPITAL_COMMUNITY): Payer: Medicaid Other

## 2020-11-08 DIAGNOSIS — I5021 Acute systolic (congestive) heart failure: Secondary | ICD-10-CM

## 2020-11-08 LAB — BASIC METABOLIC PANEL
Anion gap: 7 (ref 5–15)
BUN: 19 mg/dL (ref 8–23)
CO2: 26 mmol/L (ref 22–32)
Calcium: 9.5 mg/dL (ref 8.9–10.3)
Chloride: 102 mmol/L (ref 98–111)
Creatinine, Ser: 1.31 mg/dL — ABNORMAL HIGH (ref 0.61–1.24)
GFR, Estimated: >60 mL/min (ref >60)
Glucose, Bld: 86 mg/dL (ref 70–99)
Potassium: 4.2 mmol/L (ref 3.5–5.1)
Sodium: 135 mmol/L (ref 135–145)

## 2020-11-08 LAB — CBC
HCT: 42.2 % (ref 39.0–52.0)
Hemoglobin: 14.1 g/dL (ref 13.0–17.0)
MCH: 27.2 pg (ref 26.0–34.0)
MCHC: 33.4 g/dL (ref 30.0–36.0)
MCV: 81.5 fL (ref 80.0–100.0)
Platelets: 248 10*3/uL (ref 150–400)
RBC: 5.18 MIL/uL (ref 4.22–5.81)
RDW: 14 % (ref 11.5–15.5)
WBC: 7.3 10*3/uL (ref 4.0–10.5)
nRBC: 0.3 % — ABNORMAL HIGH (ref 0.0–0.2)

## 2020-11-08 IMAGING — CT CT HEAD W/O CM
4 series · 16 of 47 positions shown, 18 images · non-contrast
Comparison: None.

CLINICAL DATA: Syncopal episode with dizziness.

EXAM:
CT HEAD WITHOUT CONTRAST
TECHNIQUE: Contiguous axial images were obtained from the base of the skull
through the vertex without intravenous contrast.

[Series 3: head bone · axial · 0.45mm/px · z∈[-184,-156]mm · 3 of 73 slices shown]
[im 8/73  bone]
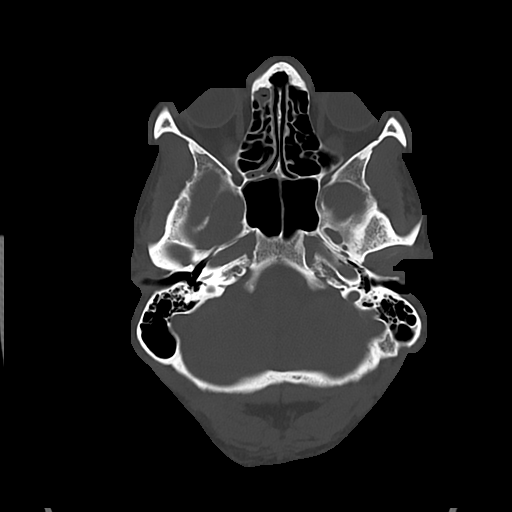
[im 15/73  bone]
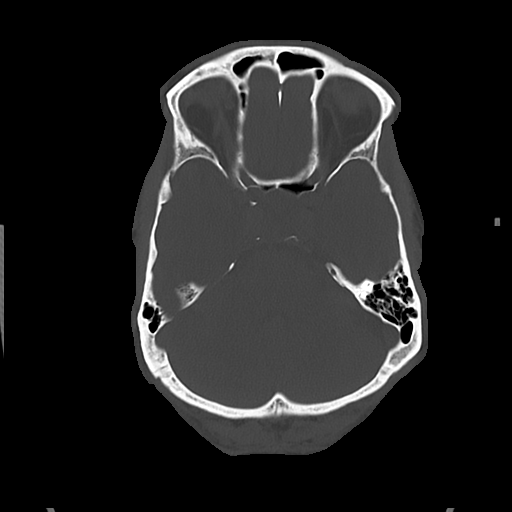
[im 22/73  bone]
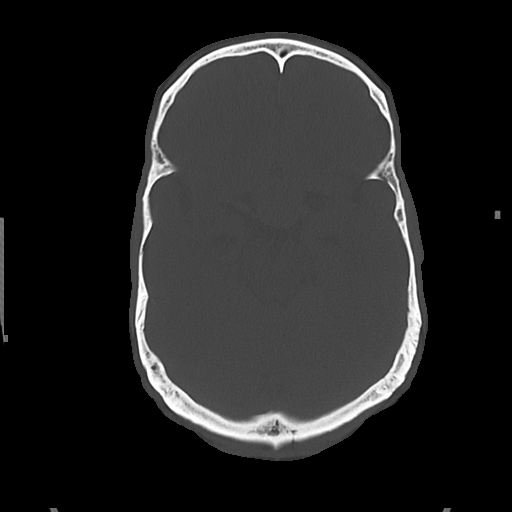

[Series 4: head wo · axial · 0.45mm/px · z∈[-182,-72]mm · 7 of 30 slices shown, 9 images]
[im 4/30  brain]
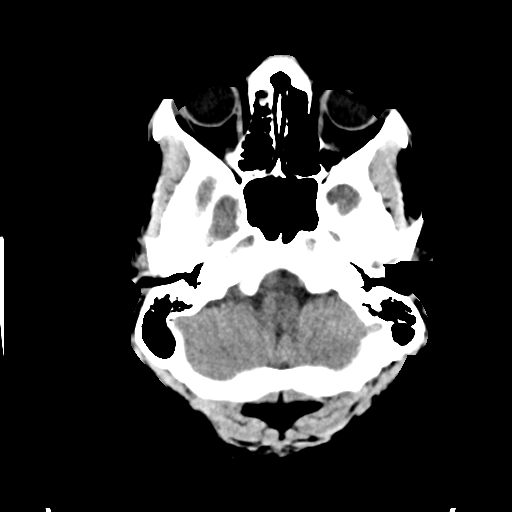
[im 4/30  bone]
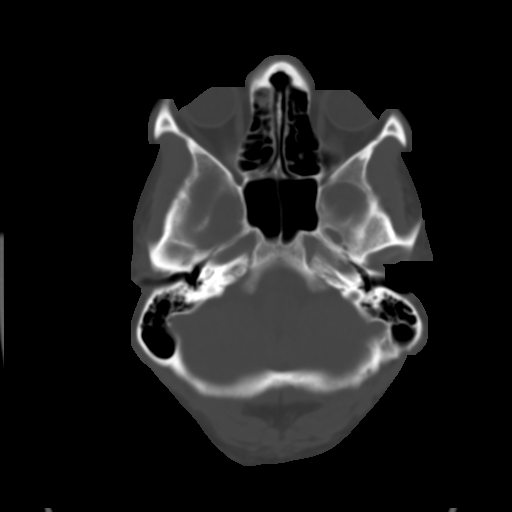
[im 8/30  brain]
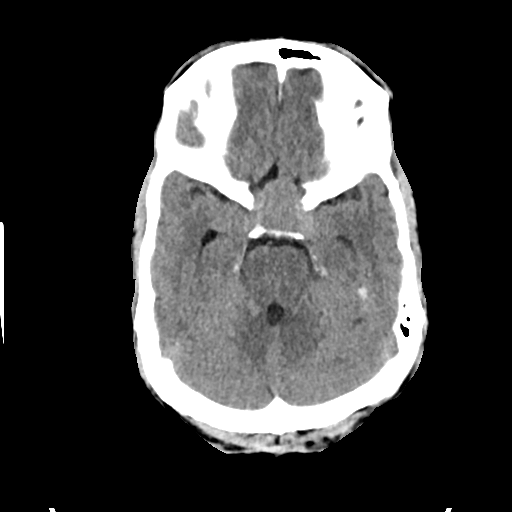
[im 11/30  brain]
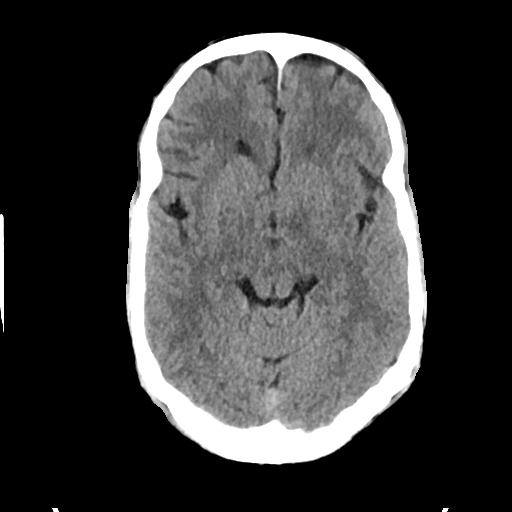
[im 15/30  brain]
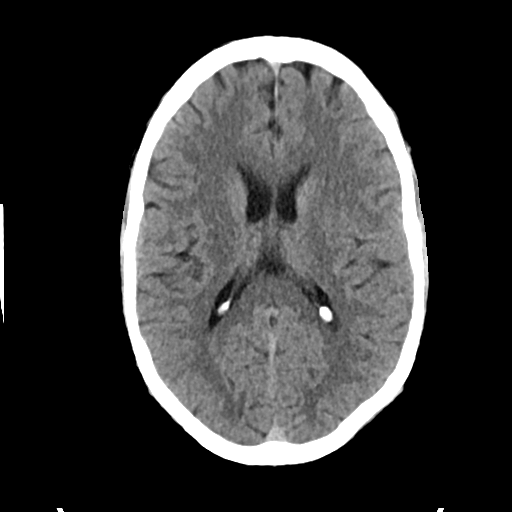
[im 19/30  brain]
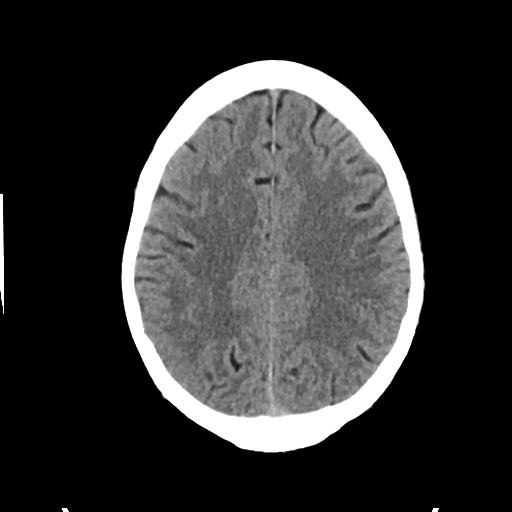
[im 19/30  bone]
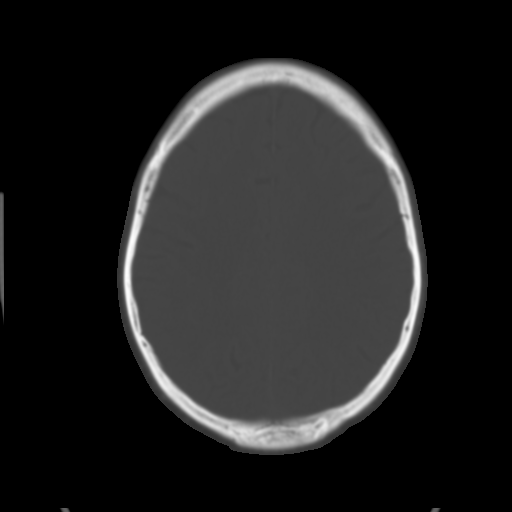
[im 22/30  brain]
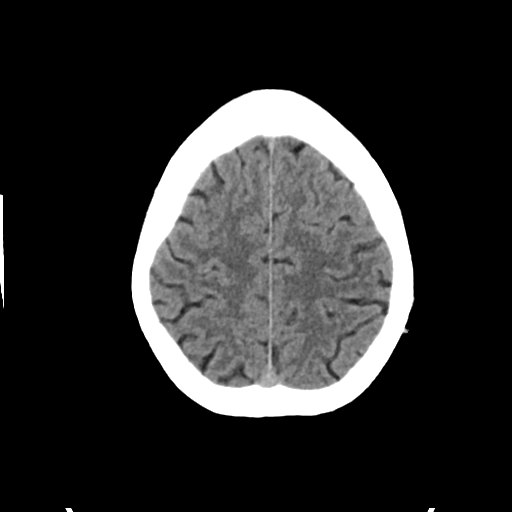
[im 26/30  brain]
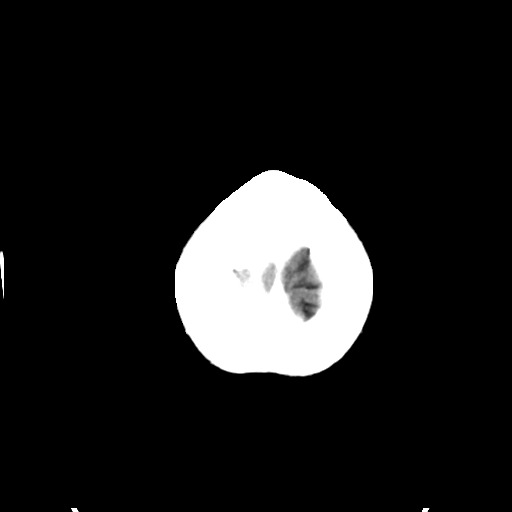

[Series 5: cor soft · coronal · 0.30mm/px · 3 of 69 slices shown]
[im 23/69  brain]
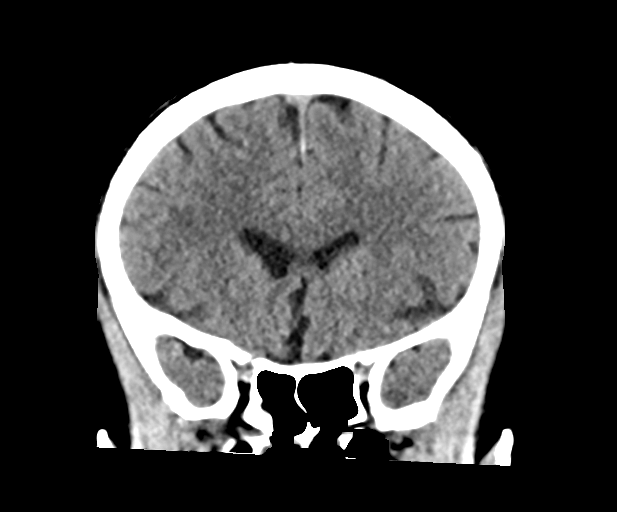
[im 31/69  brain]
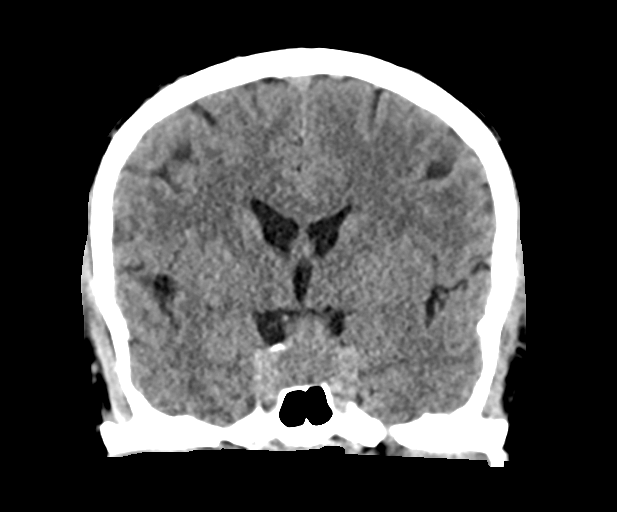
[im 38/69  brain]
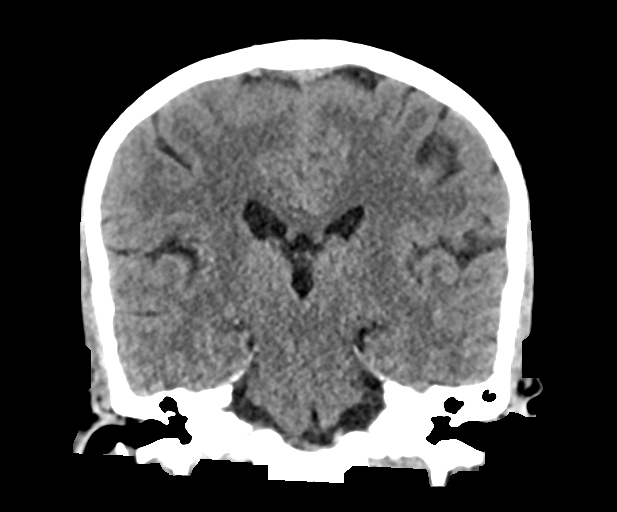

[Series 6: sag soft · sagittal · 0.33mm/px · 3 of 53 slices shown]
[im 18/53  brain]
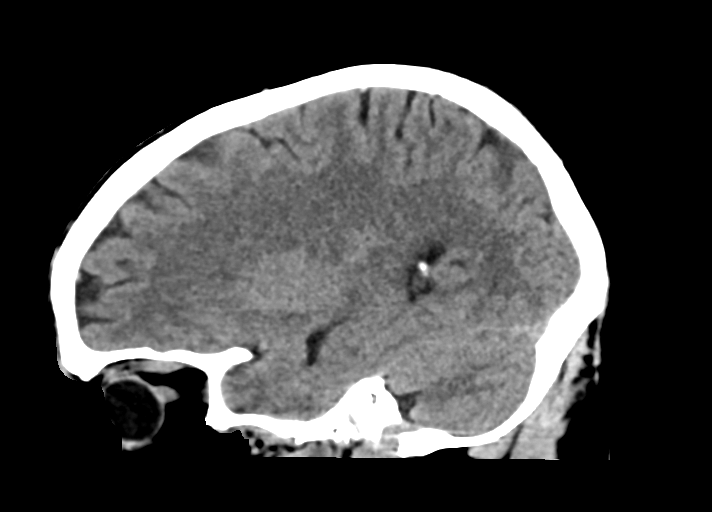
[im 27/53  brain]
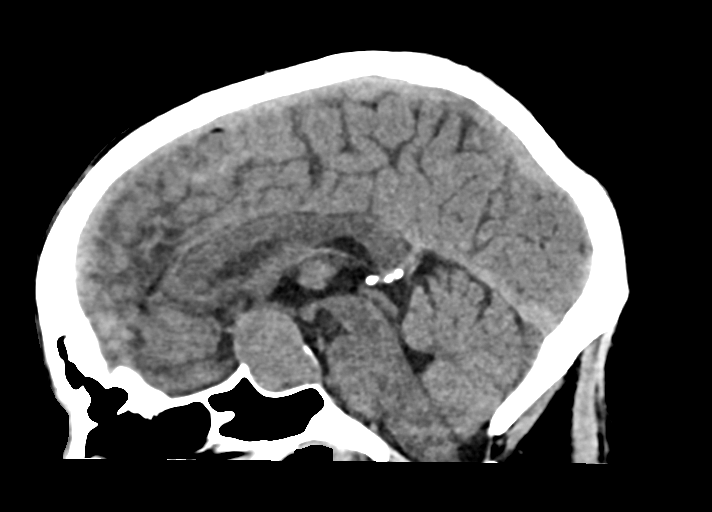
[im 35/53  brain]
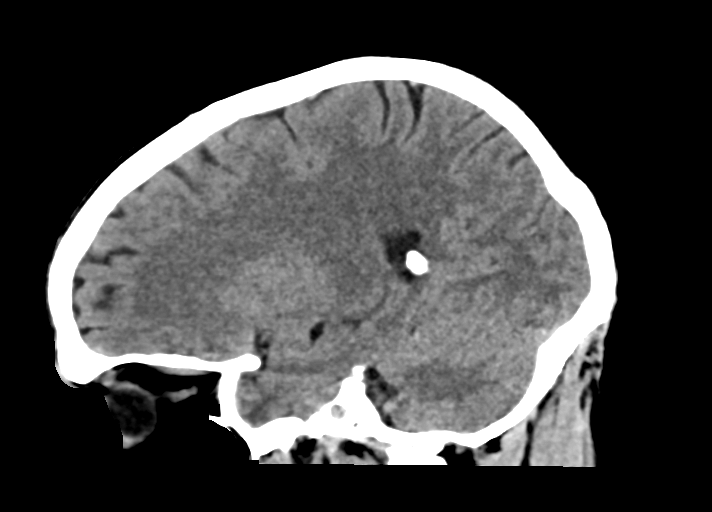

[16 of 47 positions shown; findings below may reference images not displayed]

FINDINGS: Brain: There is no evidence of an acute infarct, intracranial
hemorrhage, midline shift, or extra-axial fluid collection. The
ventricles and sulci are normal. There is an approximately 2.2 x
x 2.9 cm (AP x transverse x craniocaudal) mass which expands the
sella and extends into the suprasellar cistern superiorly displacing
the optic chiasm.

Vascular: No hyperdense vessel.

Skull: No fracture or suspicious osseous lesion.

Sinuses/Orbits: Mild right anterior ethmoid air cell mucosal
thickening. Clear mastoid air cells. Unremarkable orbits.

Other: None.
IMPRESSION: 1. 3 cm sellar/suprasellar mass, possibly a pituitary macroadenoma.
Pituitary protocol brain MRI (without and with contrast) is
recommended for further evaluation.
2. No evidence of acute intracranial abnormality.

## 2020-11-08 MED ORDER — SACUBITRIL-VALSARTAN 24-26 MG PO TABS: 1.0000 | ORAL_TABLET | Freq: Two times a day (BID) | ORAL | Status: DC

## 2020-11-08 MED ORDER — HYDROCORTISONE 1 % EX CREA
TOPICAL_CREAM | Freq: Three times a day (TID) | CUTANEOUS | Status: DC
  Filled 2020-11-08: qty 28

## 2020-11-08 MED ORDER — MECLIZINE HCL 25 MG PO TABS
12.5000 mg | ORAL_TABLET | Freq: Two times a day (BID) | ORAL | Status: DC | PRN
  Administered 2020-11-08 – 2020-11-09 (×2): 12.5 mg via ORAL
  Filled 2020-11-08 (×2): qty 1

## 2020-11-08 MED ORDER — LORATADINE 10 MG PO TABS
10.0000 mg | ORAL_TABLET | Freq: Every day | ORAL | Status: DC
  Administered 2020-11-08 – 2020-11-10 (×3): 10 mg via ORAL
  Filled 2020-11-08 (×3): qty 1

## 2020-11-08 NOTE — TOC Initial Note (Addendum)
Transition of Care (TOC) - Initial/Assessment Note  Heart Failure  Patient Details  Name: Hunter Miller MRN: [id number] Date of Birth: [birth year]  Transition of Care [facility]) CM/SW Contact:    [place], [place] Phone Number: 11/08/2020, 2:14 PM  Clinical Narrative:                 CSW spoke with the patient and family at bedside and completed a very brief SDOH screening with the patient and family who reported having some needs but declined wanting any help at this time and didn't want the CSW to put in any referrals at this time. The patient's wife, Beverlee Nims reported that her husband is very shy and didn't want to pressure him. CSW scheduled Hunter Miller a hospital follow up with his primary care provider, Dr. Sylvester Harder for the next available on 11/15/20 at 3pm. CSW provided them with an appointment card for the [facility] outpatient clinic and encouraged them to follow up and to attend the appointment and bring his medications and if anything changes to please reach out so that CSW/HV clinic team can provide support. The patients wife reported that she can provide transportation. If the patient is discharged today his medication will be filled through the heart failure fund and his wife called DSS to update the insurance so that Hunter Miller can get his medications filled.  Expected Discharge Plan: Home/Self Care Barriers to Discharge: Continued Medical Work up   Patient Goals and CMS Choice        Expected Discharge Plan and Services Expected Discharge Plan: Home/Self Care In-house Referral: Clinical Social Work                                            Prior Living Arrangements/Services     Patient language and need for interpreter reviewed:: Yes        Need for Family Participation in Patient Care: Yes (Comment) Care giver support system in place?: No (comment)   Criminal Activity/Legal Involvement Pertinent to Current Situation/Hospitalization: No - Comment as  needed  Activities of Daily Living Home Assistive Devices/Equipment: None ADL Screening (condition at time of admission) Patient's cognitive ability adequate to safely complete daily activities?: Yes Is the patient deaf or have difficulty hearing?: No Does the patient have difficulty seeing, even when wearing glasses/contacts?: No Does the patient have difficulty concentrating, remembering, or making decisions?: No Patient able to express need for assistance with ADLs?: No Does the patient have difficulty dressing or bathing?: No Independently performs ADLs?: Yes (appropriate for developmental age) Does the patient have difficulty walking or climbing stairs?: No Weakness of Legs: None Weakness of Arms/Hands: None  Permission Sought/Granted                  Emotional Assessment Appearance:: Appears stated age Attitude/Demeanor/Rapport: Engaged Affect (typically observed): Pleasant Orientation: : Oriented to Self, Oriented to Place, Oriented to  Time, Oriented to Situation   Psych Involvement: No (comment)  Admission diagnosis:  Acute combined systolic and diastolic heart failure ([place]) [I50.41] Patient Active Problem List   Diagnosis Date Noted   Hyperlipidemia 11/07/2020   Chest pain, precordial 85/46/2703   Acute systolic CHF (congestive heart failure) ([place]) 11/04/2020   Atrial flutter ([place]) 11/04/2020   HTN (hypertension) 11/04/2020   PCP:  Pcp, No Pharmacy:   [facility], [place]  AT [facility] [address] [place] [address] [place] [address] Phone: [phone] Fax: [phone]     Social Determinants of Health (SDOH) Interventions Food Insecurity Interventions: Patient Refused Financial Strain Interventions: Patient Refused Housing Interventions: Intervention Not Indicated Transportation Interventions: Intervention Not Indicated  Readmission Risk Interventions No flowsheet data found.  [name],  MSW, [place] Heart Failure Social Worker

## 2020-11-08 NOTE — Progress Notes (Signed)
CSW spoke with the patient and family at bedside to discuss about a primary care provider per cardiology. Patients wife Hunter Miller reported that they see Dr. Sylvester Harder in [place], [place] and prefer morning appointments if possible. Patient and wife agreeable for CSW to call and schedule a hospital follow up for Hunter Miller.    Abu Heavin, MSW, [place] Heart Failure Social Worker

## 2020-11-08 NOTE — Progress Notes (Addendum)
Advanced Heart Failure Rounding Note    Subjective:    TEE/cardioversion 07/18. Remains in NSR this am.   Continues to have dizziness, despite holding entresto and increase in BP. Describes sensation of room spinning when closing eyes and turning head. Previously denied LOC but now endorses syncopal episode on 07/11. Orthostatics negative.    No dyspnea. No CP.   Objective:   Weight Range: 79.6 kg Body mass index is 28.32 kg/m.   Vital Signs:   Temp:  [97.5 F (36.4 C)-98 F (36.7 C)] 98 F (36.7 C) (07/18 2020) Pulse Rate:  [50-64] 64 (07/18 1805) Resp:  [14-20] 18 (07/18 2020) BP: (87-135)/(61-112) 127/93 (07/18 2020) SpO2:  [91 %-99 %] 99 % (07/18 2020) Weight:  [79.6 kg] 79.6 kg (07/18 1052) Last BM Date: 11/06/20  Weight change: Filed Weights   11/04/20 1643 11/07/20 0511 11/07/20 1052  Weight: 78 kg 79.6 kg 79.6 kg    Intake/Output:   Intake/Output Summary (Last 24 hours) at 11/08/2020 0806 Last data filed at 11/07/2020 1227 Gross per 24 hour  Intake 400 ml  Output --  Net 400 ml      Physical Exam    General:  Well appearing. No resp difficulty. Appears stated age. HEENT: Normal Neck: Supple. No JVD. Carotids 2+ bilat; no bruits. No lymphadenopathy or thyromegaly appreciated. Cor: PMI nondisplaced. Regular rate & rhythm. No rubs, gallops or murmurs. Lungs: CTA bilaterally. Abdomen: Soft, nontender, nondistended. No hepatosplenomegaly. No bruits or masses. Good bowel sounds. Extremities: No cyanosis, clubbing, multiple excoriations from scratching, edema Neuro: Alert & orientedx3, cranial nerves grossly intact. moves all 4 extremities w/o difficulty. Affect pleasant.   Telemetry   NSR. Rates 50s-60s (personally reviewed).   Labs    CBC Recent Labs    11/07/20 0130 11/08/20 0141  WBC 8.2 7.3  HGB 14.6 14.1  HCT 44.2 42.2  MCV 82.2 81.5  PLT 235 481   Basic Metabolic Panel Recent Labs    11/07/20 0130 11/08/20 0141  NA 138 135   K 4.0 4.2  CL 105 102  CO2 24 26  GLUCOSE 103* 86  BUN 19 19  CREATININE 1.23 1.31*  CALCIUM 9.4 9.5   Liver Function Tests No results for input(s): AST, ALT, ALKPHOS, BILITOT, PROT, ALBUMIN in the last 72 hours. No results for input(s): LIPASE, AMYLASE in the last 72 hours. Cardiac Enzymes No results for input(s): CKTOTAL, CKMB, CKMBINDEX, TROPONINI in the last 72 hours.  BNP: BNP (last 3 results) No results for input(s): BNP in the last 8760 hours.  ProBNP (last 3 results) No results for input(s): PROBNP in the last 8760 hours.   D-Dimer No results for input(s): DDIMER in the last 72 hours. Hemoglobin A1C Recent Labs    11/07/20 1452  HGBA1C 6.5*   Fasting Lipid Panel No results for input(s): CHOL, HDL, LDLCALC, TRIG, CHOLHDL, LDLDIRECT in the last 72 hours. Thyroid Function Tests No results for input(s): TSH, T4TOTAL, T3FREE, THYROIDAB in the last 72 hours.  Invalid input(s): FREET3  Other results:   Imaging    No results found.   Medications:     Scheduled Medications:  amiodarone  400 mg Oral BID   apixaban  5 mg Oral BID   atorvastatin  40 mg Oral q1800   hydrocortisone cream   Topical TID   loratadine  10 mg Oral Daily   metoprolol succinate  50 mg Oral q1800   pneumococcal 23 valent vaccine  0.5 mL Intramuscular [place]-1000  sodium chloride flush  3 mL Intravenous Q12H   spironolactone  12.5 mg Oral QHS    Infusions:  sodium chloride      PRN Medications: sodium chloride, acetaminophen, diphenhydrAMINE, ondansetron (ZOFRAN) IV, sodium chloride flush   Assessment/Plan  1. Acute systolic HF - Echo LVEF 47-42% with normal left and right atrial size, mild aortic valve sclerosis with no stenosis, mild to moderate mitral regurgitation, trace tricuspid regurgitation and no pericardial effusion.  - Cath 7/16. No CAD  Normal LVEDP. 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 - S/p TEE guided cardioversion  07/18 - Diuresed with 40 mg lasix IV once. - Appears euvolemic. Not requiring loop diuretic. - Complaining of lightheadedness and had soft systolic pressures on 59/56. Holding entresto. Orthostatics negative. No change in symptoms after holding med. BP now elevated. Will not treat BP too aggressively for now until rule out CVA. See discussion in #6. - Continue Toprol 50 daily  - Continue Spiro 12.5 mg daily - No SGLT2i for now due to dizziness - CR - Will need repeat echo in 4-6 weeks to reassess LVEF after restoration of sinus rhythm. - F/u HF clinic scheduled on 08/02.    2. Atrial flutter -New diagnosis this admission. -Moderate to severe LAE on TEE. -Anticoagulation with Eliquis 5 mg BID -Loaded with IV amiodarone. S/p TEE/DC-CV 11/07/2020. Maintaining sinus rhythm. -Continue po 400 mg BID X 1 week, 200 mg BID X 1 week, then 200 mg daily. Not planning to use long-term. -Continue Toprol 50 mg daily. -To see EP in f/u as outpatient for consideration of ablation. F/u scheduled. -Mild snoring. Sleep study as outpatient. -Denies ETOH. -TSH normal.    3. Hyperlipidemia - Lipid panel, CHO-251, LDL-173. - Added atorvastatin 40 mg daily. - Needs FLP and AST/ALT in 6-8 weeks.   4. Diabetes - A1c 6.5% - Needs PCP follow-up   5. HTN -BP above goal this am. -Med changes as in #1.   6. Dizziness/syncope -Syncopal episode reported on 07/11.   -Sensation of room spinning when closing eyes and turning head.  -Orthostatics negative. -No change despite rise in BP and holding entresto. -Hgb stable.  -Add meclizine prn to see if helps with symptoms -CT head w/o contrast today to evaluate. Concern for possible embolic event since he presented in AFL -Bradycardia reported at outside clinic (no ECG)- may need monitor at discharge to r/o significant bradyarrhythmias. No significant pauses identified on tele.    SDOH: -Pharmacist consulted to assist with medications. Has medicaid.  Potentially has other coverage. Wife bringing insurance cards today. -TOC consult - needs PCP referral, new diagnosis of diabetes.  Length of Stay: 4  FINCH, LINDSAY N, PA-C  11/08/2020, 8:06 AM  Advanced Heart Failure Team Pager [phone] (M-F; 7a - 5p)  Please contact [facility] for night-coverage after hours (5p -7a ) and weekends on [email or web address]  Patient seen and examined with the above-signed Advanced Practice Provider and/or Housestaff. I personally reviewed laboratory data, imaging studies and relevant notes. I independently examined the patient and formulated the important aspects of the plan. I have edited the note to reflect any of my changes or salient points. I have personally discussed the plan with the patient and/or family.  Remains in NSR after DC-CV. Has been dizzy post-procedure. No CP or SOB.  CT of brain suggests pituitary tumor. Brain MRI ordered.   General:  Sitting up in bed. No resp difficulty HEENT: normal Neck: supple. no  JVD. Carotids 2+ bilat; no bruits. No lymphadenopathy or thryomegaly appreciated. Cor: PMI nondisplaced. Regular rate & rhythm. No rubs, gallops or murmurs. Lungs: clear Abdomen: soft, nontender, nondistended. No hepatosplenomegaly. No bruits or masses. Good bowel sounds. Extremities: no cyanosis, clubbing, rash, edema Neuro: alert & orientedx3, cranial nerves grossly intact. moves all 4 extremities w/o difficulty. Affect pleasant  Remains in NSR. Volume status looks ok. C/o dizziness post DC-CV. Brain CT suggests pituitary mass. Will get brain MRI to further evaluate for mass and possible embolic event post DC-CV.   Glori Bickers, MD  5:41 PM

## 2020-11-08 NOTE — Progress Notes (Signed)
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  Discussed results at bedside with patient, his wife and daughter (who was on the phone).  Pituitary protocol brain MRI w/ and w/o contrast recommended. Patient agrees to exam.  Marlyce Huge, PA-C

## 2020-11-09 ENCOUNTER — Inpatient Hospital Stay (HOSPITAL_COMMUNITY): Payer: Medicaid Other

## 2020-11-09 DIAGNOSIS — D352 Benign neoplasm of pituitary gland: Secondary | ICD-10-CM

## 2020-11-09 DIAGNOSIS — G4489 Other headache syndrome: Secondary | ICD-10-CM

## 2020-11-09 DIAGNOSIS — H539 Unspecified visual disturbance: Secondary | ICD-10-CM

## 2020-11-09 LAB — BASIC METABOLIC PANEL
Anion gap: 12 (ref 5–15)
BUN: 22 mg/dL (ref 8–23)
CO2: 21 mmol/L — ABNORMAL LOW (ref 22–32)
Calcium: 9.7 mg/dL (ref 8.9–10.3)
Chloride: 103 mmol/L (ref 98–111)
Creatinine, Ser: 1.35 mg/dL — ABNORMAL HIGH (ref 0.61–1.24)
GFR, Estimated: 60 mL/min — ABNORMAL LOW (ref >60)
Glucose, Bld: 96 mg/dL (ref 70–99)
Potassium: 4 mmol/L (ref 3.5–5.1)
Sodium: 136 mmol/L (ref 135–145)

## 2020-11-09 LAB — CBC
HCT: 43.6 % (ref 39.0–52.0)
Hemoglobin: 14.2 g/dL (ref 13.0–17.0)
MCH: 26.8 pg (ref 26.0–34.0)
MCHC: 32.6 g/dL (ref 30.0–36.0)
MCV: 82.4 fL (ref 80.0–100.0)
Platelets: 253 10*3/uL (ref 150–400)
RBC: 5.29 MIL/uL (ref 4.22–5.81)
RDW: 14 % (ref 11.5–15.5)
WBC: 8 10*3/uL (ref 4.0–10.5)
nRBC: 0 % (ref 0.0–0.2)

## 2020-11-09 LAB — LDL CHOLESTEROL, DIRECT: Direct LDL: 171.4 mg/dL — ABNORMAL HIGH (ref 0–99)

## 2020-11-09 IMAGING — MR MR HEAD WO/W CM
10 of 22 series · 25 of 48 positions shown · IV contrast (gadavist)
Comparison: Head CT from yesterday

CLINICAL DATA: 3 cm suprasellar mass

EXAM:
MRI HEAD WITHOUT AND WITH CONTRAST
TECHNIQUE: Multiplanar, multiecho pulse sequences of the brain and surrounding
structures were obtained without and with intravenous contrast.
CONTRAST:  3.2mL GADAVIST GADOBUTROL 1 MMOL/ML IV SOLN

[Series 2: DWI · axial · 3.0mm · 0.94mm/px · z∈[-66,+72]mm · 6 of 93 slices shown (1 of 2)]
[im 1/93]
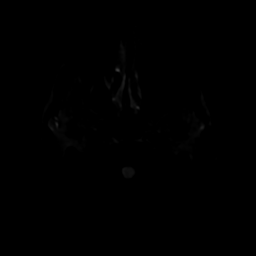
[im 19/93]
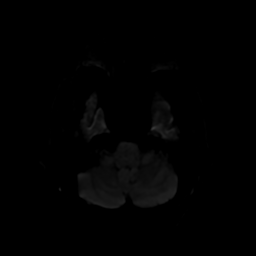
[im 37/93]
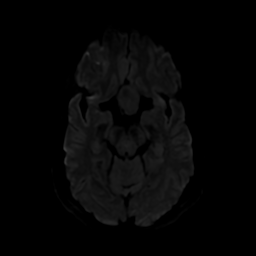
[im 56/93]
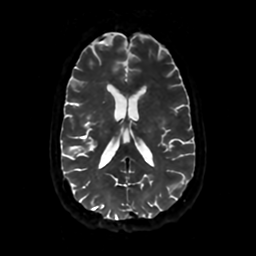
[im 74/93]
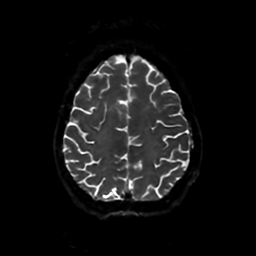
[im 93/93]
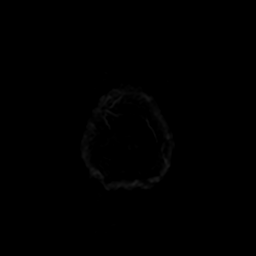

[Series 3: DWI · coronal · 4.0mm · 0.94mm/px · 4 of 72 slices shown (2 of 2)]
[im 1/72]
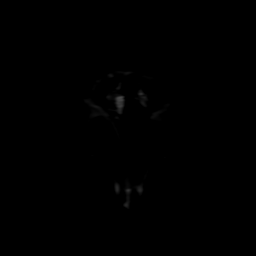
[im 24/72]
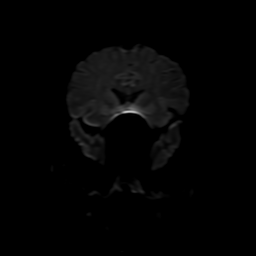
[im 48/72]
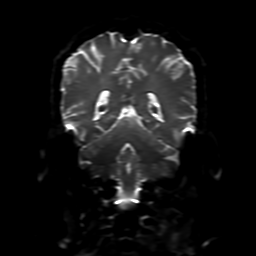
[im 72/72]
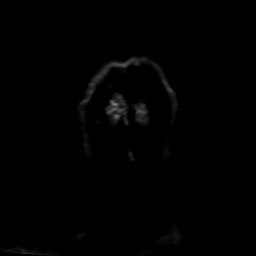

[Series 4: FLAIR · sagittal · 5.0mm · 0.47mm/px · 2 of 23 slices shown (1 of 2)]
[im 1/23]
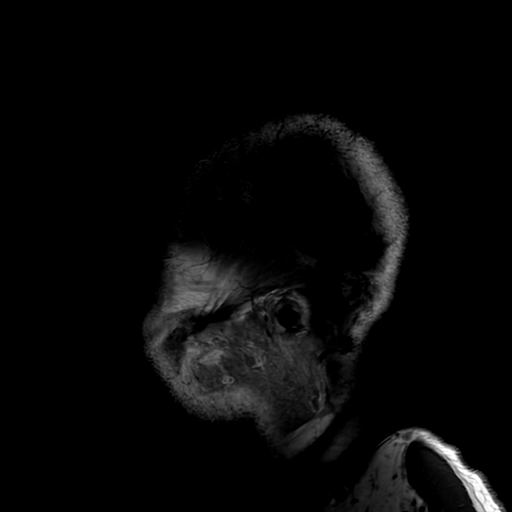
[im 23/23]
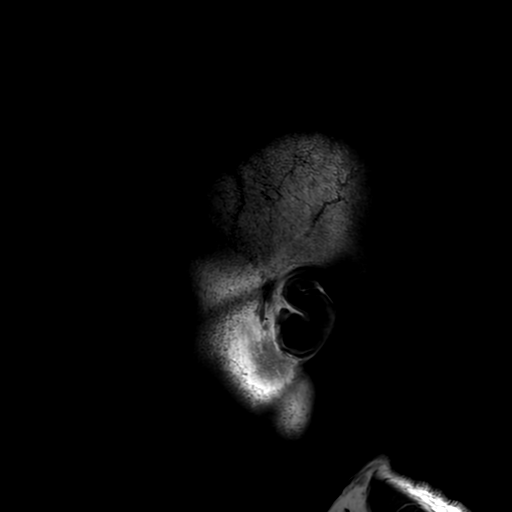

[Series 5: T2 · axial · 5.0mm · 0.23mm/px · z∈[-66,+72]mm · 2 of 24 slices shown (1 of 2)]
[im 1/24]
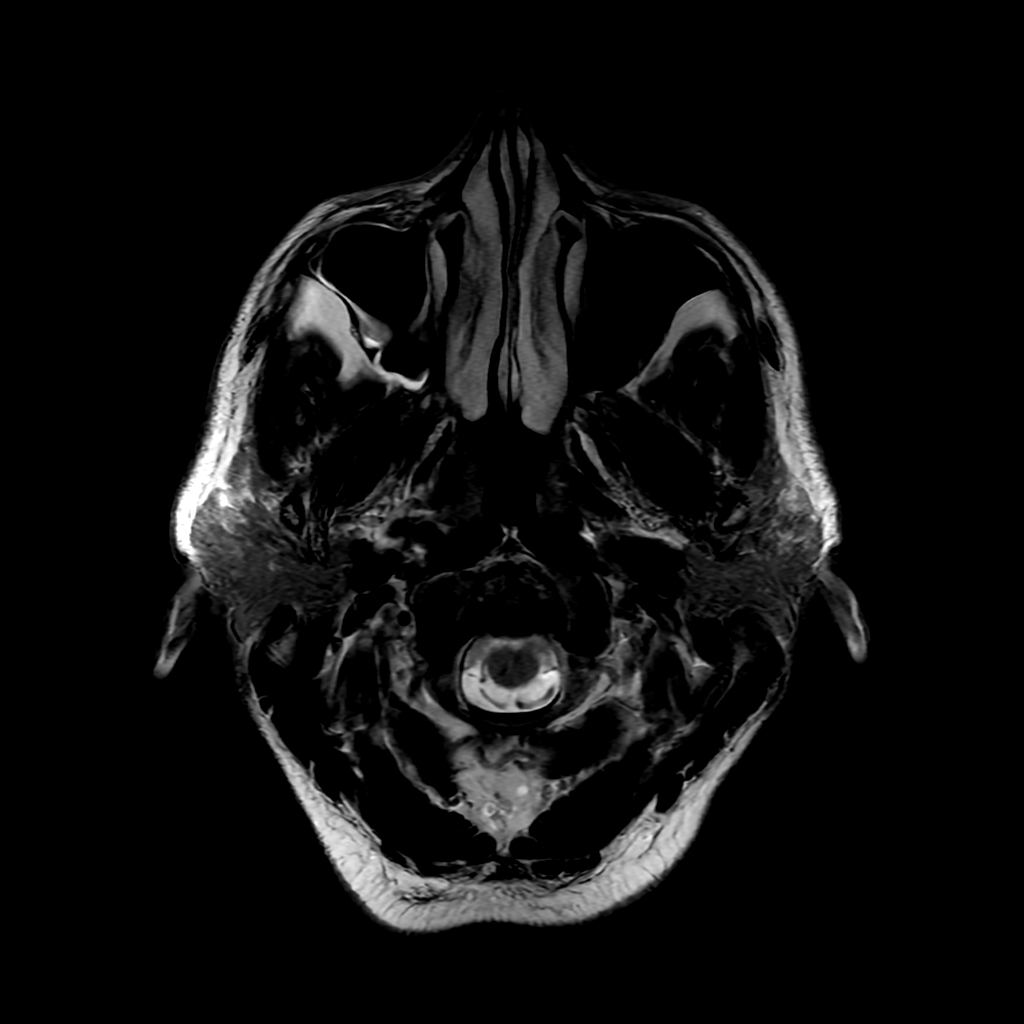
[im 24/24]
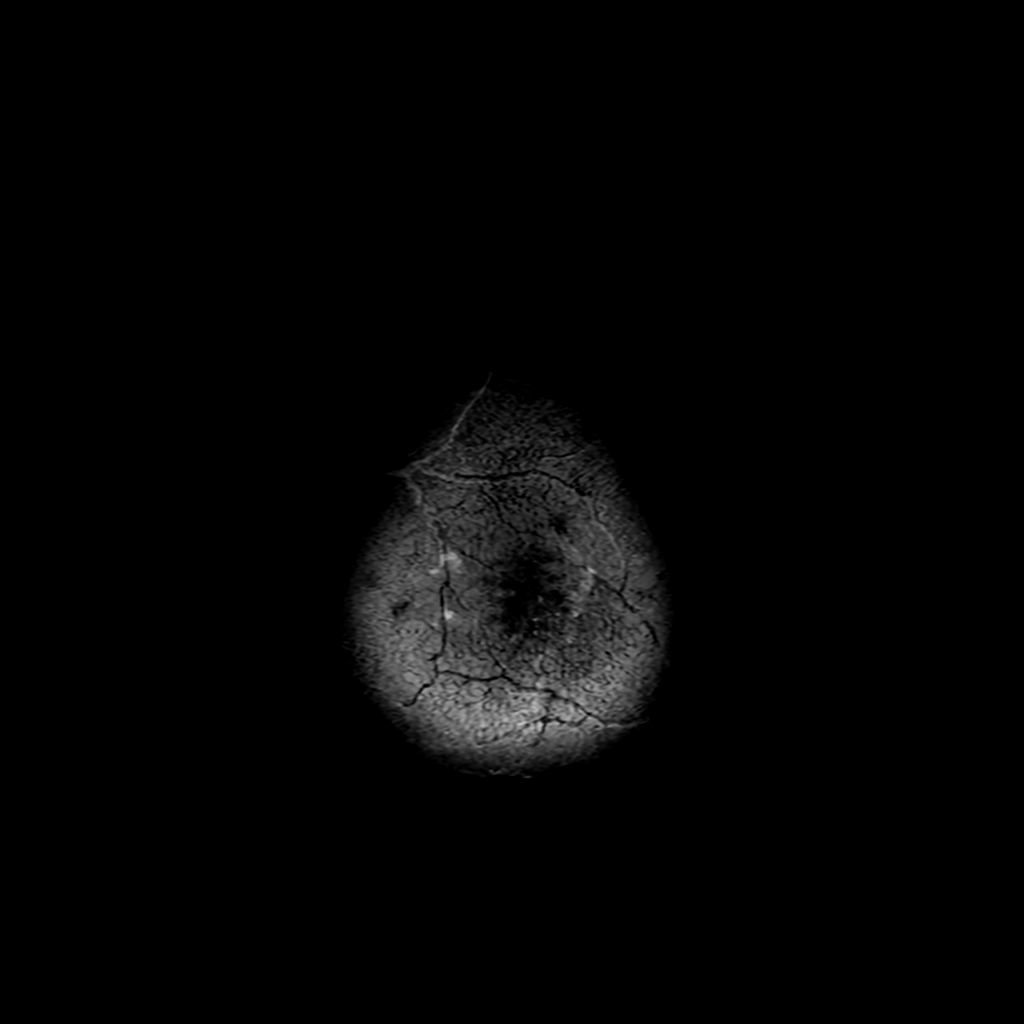

[Series 6: FLAIR · axial · 4.0mm · 0.45mm/px · z∈[-64,+77]mm · 2 of 33 slices shown (2 of 2)]
[im 1/33]
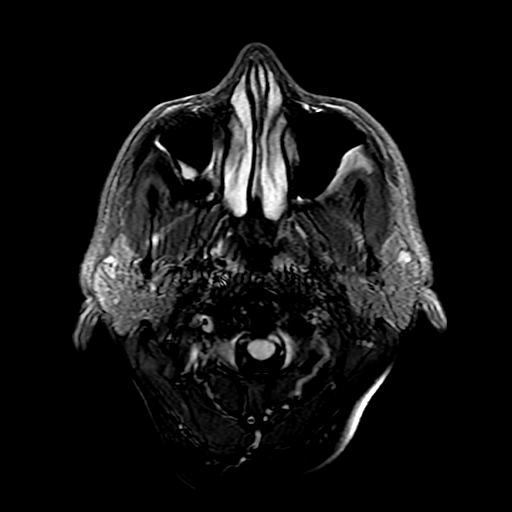
[im 33/33]
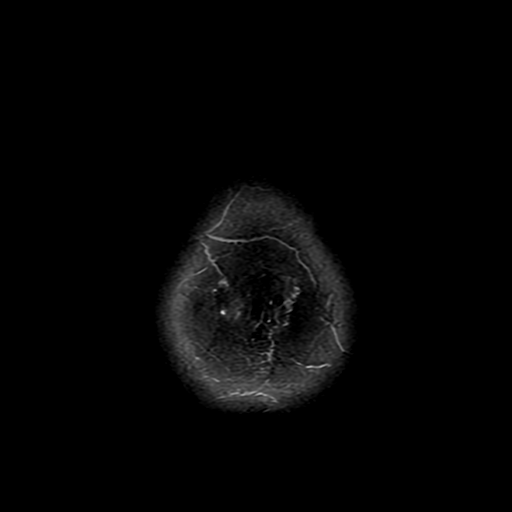

[Series 11: T2 · coronal · 3.0mm · 0.35mm/px · 1 of 19 slices shown (2 of 2)]
[im 1/19]
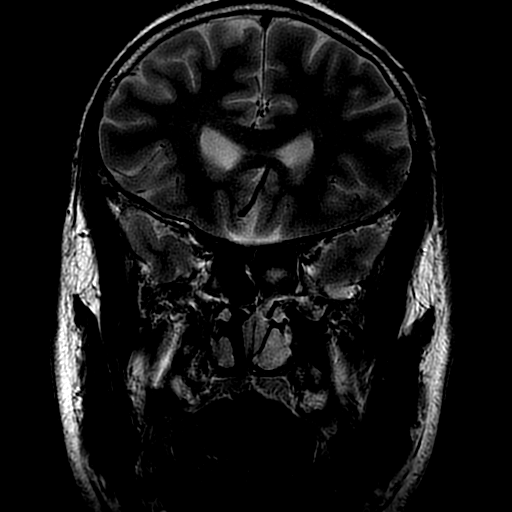

[Series 13: T1 post-contrast · sagittal · 3.0mm · 0.35mm/px · 1 of 15 slices shown (1 of 2)]
[im 1/15]
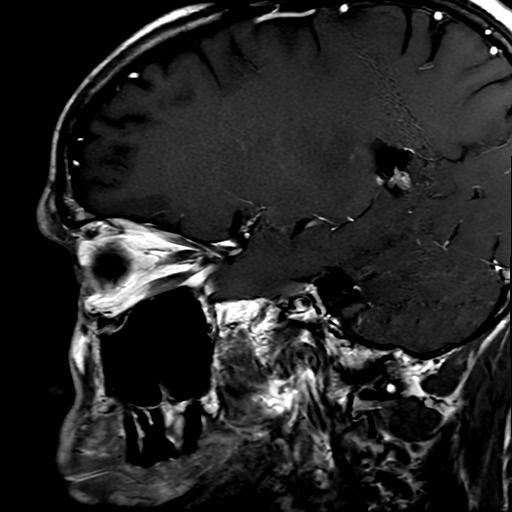

[Series 14: T1 post-contrast · coronal · 3.0mm · 0.35mm/px · 1 of 19 slices shown (2 of 2)]
[im 1/19]
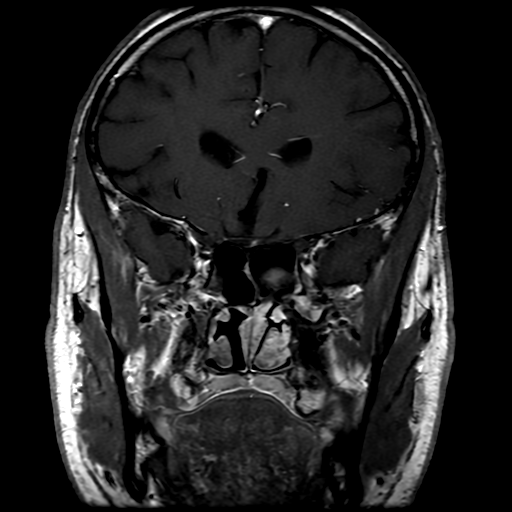

[Series 250: ADC · axial · 3.0mm · 0.94mm/px · z∈[-66,+72]mm · 3 of 47 slices shown (1 of 2)]
[im 1/47]
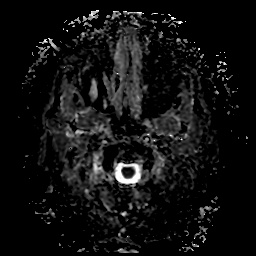
[im 24/47]
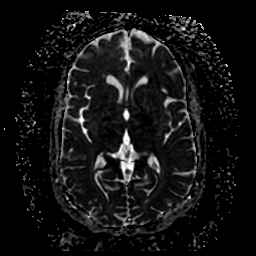
[im 47/47]
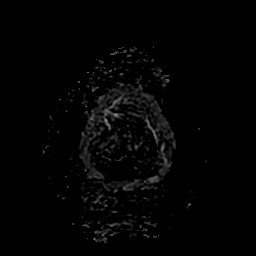

[Series 350: ADC · coronal · 4.0mm · 0.94mm/px · 3 of 37 slices shown (2 of 2)]
[im 1/37]
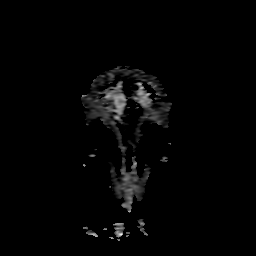
[im 19/37]
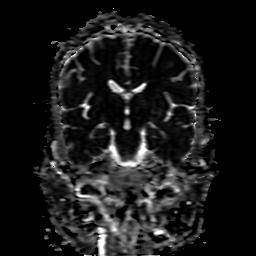
[im 37/37]
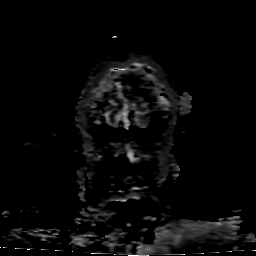

[25 of 48 positions shown; findings below may reference images not displayed]

FINDINGS: Brain: Bilobed diffusely enhancing sellar and suprasellar mass
indistinguishable from a normal pituitary gland. Craniocaudal span
is 2.9 cm and there is prominent chiasmatic compression and up
lifting. Transverse span measures up to 2.1 cm. Anterior to
posterior span is 22 mm. No cavernous sinus region invasion.

No incidental infarct, hemorrhage, hydrocephalus, or collection.

Vascular: Preserved flow voids and vascular enhancements

Skull and upper cervical spine: Normal marrow signal

Sinuses/Orbits: Negative
IMPRESSION: 29 x 22 x 21 mm pituitary macro adenoma with expanded sella and
chiasmatic compression.

## 2020-11-09 MED ORDER — AMIODARONE HCL 200 MG PO TABS
200.0000 mg | ORAL_TABLET | Freq: Two times a day (BID) | ORAL | Status: DC
  Administered 2020-11-09 – 2020-11-10 (×2): 200 mg via ORAL
  Filled 2020-11-09 (×2): qty 1

## 2020-11-09 MED ORDER — GADOBUTROL 1 MMOL/ML IV SOLN
3.2000 mL | Freq: Once | INTRAVENOUS | Status: AC | PRN
  Administered 2020-11-09: 3.2 mL via INTRAVENOUS

## 2020-11-09 NOTE — TOC Progression Note (Signed)
Transition of Care (TOC) - Progression Note  Heart Failure   Patient Details  Name: Hunter Miller MRN: [id number] Date of Birth: 1960/11/06  Transition of Care [facility]) CM/SW [place], [place] Phone Number: 11/09/2020, 3:59 PM  Clinical Narrative:    CSW received a message from the NP, Cardiologist stating that he will need a follow up with neurosurgery and ophthalmology due to a large pituitary tumor and for the CSW to verify coverage with the patients insurance. CSW spoke with [facility] who reported that specialist appointments will be covered.  CSW will continue to follow throughout discharge.  Expected Discharge Plan: Home/Self Care Barriers to Discharge: Continued Medical Work up  Expected Discharge Plan and Services Expected Discharge Plan: Home/Self Care In-house Referral: Clinical Social Work                                             Social Determinants of Health (SDOH) Interventions Food Insecurity Interventions: Patient Refused Financial Strain Interventions: Patient Refused Housing Interventions: Intervention Not Indicated Transportation Interventions: Intervention Not Indicated  Readmission Risk Interventions No flowsheet data found.  Ozella Comins, MSW, [place] Heart Failure Social Worker

## 2020-11-09 NOTE — Consult Note (Signed)
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 Haroldine Laws  CC: Headaches, dizziness  History is obtained from: Chart review, Patient   HPI: Hunter Miller is a 60 y.o. male with a medical history significant for essential hypertension and hyperlipidemia who presented to [facility] on 7/15 for evaluation of chest pain on exertion and approximately one month if intermittent palpitations. Work up revealed paroxysmal atrial flutter and echocardiogram showed acute congestive heart failure with dilated cardiomyopathy with an LVEF of 20-25% and promptingg patient transfer to [facility] for TEE and cardioversion. Following cardioversion, patient had complaints of persistent lightheadedness and a room spinning sensation when closing his eyes and turning his head. A CTH was obtained revealing a possible pituitary macroadenoma. A pituitary protocol brain MRI was obtained verifying the presence a pituitary adenoma with expanded sella and chiasmatic compression and neurology was consulted for further evaluation and management.   Patient endorses a daily, frontal, pulsating quality headache that ranges from 8-10/10 in severity with slight improvement after taking acetaminophen. He also endorses acute onset of left eye blurred vision approximately 6 months ago with onset of headache. He does endorse some falls at home from an unsteady gait and intermittent complaints of a room spinning sensation when he closes his eyes. He also endorses dizziness that is worse with standing and diplopia in forced left lateral gaze.   ROS: A complete ROS was performed and is negative except as noted in the HPI.  Past Medical History:  Diagnosis Date   HTN (hypertension)   Hyperlipidemia  Past Surgical History:  Procedure Laterality Date   CARDIOVERSION N/A 11/07/2020   Procedure: CARDIOVERSION;  Surgeon:  Jolaine Artist, MD;  Location: [facility] ENDOSCOPY;  Service: Cardiovascular;  Laterality: N/A;   RIGHT/LEFT HEART CATH AND CORONARY ANGIOGRAPHY N/A 11/04/2020   Procedure: RIGHT/LEFT HEART CATH AND CORONARY ANGIOGRAPHY;  Surgeon: Martinique, Peter M, MD;  Location: [place] CV LAB;  Service: Cardiovascular;  Laterality: N/A;   RIGHT/LEFT HEART CATH AND CORONARY/GRAFT ANGIOGRAPHY N/A 11/04/2020   Procedure: RIGHT/LEFT HEART CATH AND CORONARY/GRAFT ANGIOGRAPHY;  Surgeon: Martinique, Peter M, MD;  Location: [place] CV LAB;  Service: Cardiovascular;  Laterality: N/A;   TEE WITHOUT CARDIOVERSION N/A 11/07/2020   Procedure: TRANSESOPHAGEAL ECHOCARDIOGRAM (TEE);  Surgeon: Jolaine Artist, MD;  Location: [facility] ENDOSCOPY;  Service: Cardiovascular;  Laterality: N/A;   Family History  Problem Relation Age of Onset   Asthma Father    COPD Father    Social History:   reports that he has never smoked. He has never used smokeless tobacco. No history on file for alcohol use and drug use.  Medications  Current Facility-Administered Medications:    0.9 %  sodium chloride infusion, 250 mL, Intravenous, PRN, Martinique, Peter M, MD   acetaminophen (TYLENOL) tablet 650 mg, 650 mg, Oral, Q4H PRN, Martinique, Peter M, MD, 650 mg at 11/08/20 2302   amiodarone (PACERONE) tablet 400 mg, 400 mg, Oral, BID, Joette Catching, PA-C, 400 mg at 11/09/20 2440   apixaban (ELIQUIS) tablet 5 mg, 5 mg, Oral, BID, Lars Mage T, MD, 5 mg at 11/09/20 1027   atorvastatin (LIPITOR) tablet 40 mg, 40 mg, Oral, q1800, Lyda Jester M, PA-C, 40 mg at 11/08/20 1749   diphenhydrAMINE (BENADRYL) capsule 25 mg, 25 mg, Oral, Q6H PRN, Darreld Mclean, PA-C, 25 mg at 11/07/20 2238   hydrocortisone cream 1 %, , Topical, TID, Martinique, Peter M, MD, Given  at 11/08/20 2200   loratadine (CLARITIN) tablet 10 mg, 10 mg, Oral, Daily, Martinique, Peter M, MD, 10 mg at 11/09/20 8366   meclizine (ANTIVERT) tablet 12.5 mg, 12.5 mg, Oral, BID PRN,  Joette Catching, PA-C, 12.5 mg at 11/09/20 0810   metoprolol succinate (TOPROL-XL) 24 hr tablet 50 mg, 50 mg, Oral, q1800, Joette Catching, PA-C, 50 mg at 11/08/20 1749   ondansetron (ZOFRAN) injection 4 mg, 4 mg, Intravenous, Q6H PRN, Martinique, Peter M, MD, 4 mg at 11/09/20 1010   pneumococcal 23 valent vaccine (PNEUMOVAX-23) injection 0.5 mL, 0.5 mL, Intramuscular, Tomorrow-1000, Martinique, Peter M, MD   sodium chloride flush (NS) 0.9 % injection 3 mL, 3 mL, Intravenous, Q12H, Martinique, Peter M, MD, 3 mL at 11/09/20 0813   sodium chloride flush (NS) 0.9 % injection 3 mL, 3 mL, Intravenous, PRN, Martinique, Peter M, MD   spironolactone (ALDACTONE) tablet 12.5 mg, 12.5 mg, Oral, QHS, Simmons, Brittainy M, PA-C, 12.5 mg at 11/08/20 2301  Exam: Current vital signs: BP 112/74 (BP Location: Right Arm)   Pulse (!) 53   Temp 97.7 F (36.5 C) (Oral)   Resp 20   Ht 5\' 6"  (1.676 m)   Wt 77 kg   SpO2 95%   BMI 27.39 kg/m  Vital signs in last 24 hours: Temp:  [97.7 F (36.5 C)-98 F (36.7 C)] 97.7 F (36.5 C) (07/20 0804) Pulse Rate:  [53-63] 53 (07/20 0804) Resp:  [16-20] 20 (07/20 0804) BP: (112-140)/(74-96) 112/74 (07/20 0517) SpO2:  [95 %-98 %] 95 % (07/20 0804) Weight:  [77 kg] 77 kg (07/20 0517)  GENERAL: Sitting up in bed watching television, in no acute distress Psych: Affect appropriate for situation, patient calm and cooperative with examination Head: Normocephalic and atraumatic, without obvious abnormality EENT: Normal conjunctivae, arcus senilis present bilaterally, no OP obstruction LUNGS: Normal respiratory effort. Non-labored breathing on room air  CV: Bradycardia on cardiac monitor, no pedal edema noted ABDOMEN - Soft, non-tender, non-distended Ext: warm, well perfused, without obvious deformity  NEURO:  Mental Status: Awake, alert, and oriented to person, place, time, and situation.  He is able to provide a clear and coherent history of present illness.   Speech/Language: speech is intact without dysarthria.   Naming, repetition, fluency, and comprehension intact without aphasia.  No neglect is noted.  Cranial Nerves:  II: PERRL 4 mm/brisk. Visual fields full.  III, IV, VI: EOMI without ptosis. Diplopia present in forced left lateral gaze.  V: Sensation is intact to light touch on both sides of face with decreased sensation reported on the left face.  VII: Face is symmetric resting and smiling.  VIII: Hearing is intact to voice IX, X: Palate elevation is symmetric. Phonation normal.  XI: Normal sternocleidomastoid and trapezius muscle strength XII: Tongue protrudes midline without fasciculations.   Motor: 5/5 strength is all muscle groups without vertical drift on assessment. Moves all extremities with spontaneous and antigravity movement.  Tone is normal. Bulk is normal.  Sensation: Decreased sensation to light touch present in the left upper and lower extremities.  Coordination: FTN intact bilaterally.  DTRs: 2+ and symmetric biceps, brachioradialis, and patellae.  Gait: Unsteady gait. Unable to ambulate without holding onto objects for stabilization.   Labs I have reviewed labs in epic and the results pertinent to this consultation are: CBC    Component Value Date/Time   WBC 8.0 11/09/2020 0143   RBC 5.29 11/09/2020 0143   HGB 14.2 11/09/2020 0143   HCT 43.6 11/09/2020  0143   PLT 253 11/09/2020 0143   MCV 82.4 11/09/2020 0143   MCH 26.8 11/09/2020 0143   MCHC 32.6 11/09/2020 0143   RDW 14.0 11/09/2020 0143   CMP     Component Value Date/Time   NA 136 11/09/2020 0143   K 4.0 11/09/2020 0143   CL 103 11/09/2020 0143   CO2 21 (L) 11/09/2020 0143   GLUCOSE 96 11/09/2020 0143   BUN 22 11/09/2020 0143   CREATININE 1.35 (H) 11/09/2020 0143   CALCIUM 9.7 11/09/2020 0143   GFRNONAA 60 (L) 11/09/2020 0143   Lipid Panel  No results found for: CHOL, HDL, LDLCALC, LDLDIRECT, TRIG, CHOLHDL  Lab Results  Component Value Date    HGBA1C 6.5 (H) 11/07/2020    Imaging I have reviewed the images obtained:  CT-scan of the brain 11/08/2020: 1. 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 Pituitary protocol brain MRI (without and with contrast) is recommended for further evaluation. 2. No evidence of acute intracranial abnormality.  MRI examination of the brain 11/09/2020: 29 x 22 x 21 mm pituitary macro adenoma with expanded sella and chiasmatic compression.  Assessment: 60 y.o. male initially evaluated for chest pain and found to have congestive heart failure, dilated cardiomyopathy, and paroxysmal atrial flutter s/p cardioversion with persistent complaints of dizziness and headache. Brain imaging revealed a 29 x 22 x 21 mm pituitary macroadenoma with expanded sella and chiasmatic compression.  - Examination revealed patient with c/o 6 months of daily, frontal, pulsating headaches 8-10/10 in severity, left eye blurred vision, intermittent room spinning sensation, and unsteady gait s/p falls at home with left hemibody sensory disturbance.  - MRI pituitary protocol with evidence of pituitary macroadenoma with expanded sella and chiasmatic compression. Ophthalmology consulted 2/2 chiasmatic compression and neurosurgery consulted for further evaluation and intervention recommendations.  - Headache and visual disturbance likely 2/2 pituitary adenoma identified on MRI imaging.  - Insulin-like growth factor, ACTH, urinary free cortisol, prolactin, LH, and FSH levels ordered and pending for further evaluation of etiology of pituitary adenoma.   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 Note/plan to be edited by MD as needed.  Anibal Henderson, AGAC-NP [facility] Pager: [phone]  Neurology Attending Attestation   I examined the patient and discussed plan with Ms. Toberman NP. Above note has been edited by me to reflect my findings and recommendations. Appropriate pituitary hormone panel ordered. Please contact neurology if any of these labs are significantly abnormal. We will not continue to actively follow, recommend f/u with neurosurgery for outpatient elective resection as discussed.   Su Monks, MD [facility] [phone]   If 7pm- 7am, please page neurology on call as listed in [place].

## 2020-11-09 NOTE — Progress Notes (Addendum)
Advanced Heart Failure Rounding Note    Subjective:    Head CT on 7/19 revealed 3 cm sellar/supracellar mass, possibly a pituitary macroadenoma.  MRI completed and notable for 29x22.21 mm pituitary macroadenoma with expanded sella and chiasmatic compression.   Just returned from MRI with complaints of dizziness, nausea, headache and shortness of breath.  He says that dizziness is slightly improved, but headache is "very strong" and feels as if something is pushing against the front of his head.    Neurology consulted and recommending evaluation by neurosurgery for possible tumor resection as well as evaluation of visual field by opththalmology.  FSH, LH, insulin-like growth factor and prolactin pending.    Objective:   Weight Range: 77 kg Body mass index is 27.39 kg/m.   Vital Signs:   Temp:  [97.6 F (36.4 C)-98 F (36.7 C)] 97.7 F (36.5 C) (07/20 0804) Pulse Rate:  [53-63] 53 (07/20 0804) Resp:  [16-20] 20 (07/20 0804) BP: (112-140)/(74-96) 112/74 (07/20 0517) SpO2:  [95 %-98 %] 95 % (07/20 0804) Weight:  [76.3 kg-77 kg] 77 kg (07/20 0517) Last BM Date: 11/08/20  Weight change: Filed Weights   11/07/20 1052 11/08/20 1058 11/09/20 0517  Weight: 79.6 kg 76.3 kg 77 kg    Intake/Output:   Intake/Output Summary (Last 24 hours) at 11/09/2020 1610 Last data filed at 11/09/2020 0800 Gross per 24 hour  Intake 600 ml  Output --  Net 600 ml       Physical Exam    General:  Well appearing. No resp difficulty. Appears stated age. HEENT: Normocephalic and atraumatic. Neck: Supple. No JVD. Carotids 2+ bilat; no bruits. No lymphadenopathy or thyromegaly appreciated. Cor: PMI nondisplaced. Regular rate and rhythm. No rubs, gallops or murmurs. Lungs: CTA bilaterally. Abdomen: Soft, nontender, nondistended. No hepatosplenomegaly. No bruits or masses. Good bowel sounds. Extremities: No cyanosis, clubbing, multiple excoriations from scratching, edema Neuro: Alert &  orientedx3, cranial nerves grossly intact with no focal neuro deficits. moves all 4 extremities w/o difficulty. Affect pleasant.   Telemetry   NSR. Rates 50s-60s (personally reviewed).   Labs    CBC Recent Labs    11/08/20 0141 11/09/20 0143  WBC 7.3 8.0  HGB 14.1 14.2  HCT 42.2 43.6  MCV 81.5 82.4  PLT 248 960    Basic Metabolic Panel Recent Labs    11/08/20 0141 11/09/20 0143  NA 135 136  K 4.2 4.0  CL 102 103  CO2 26 21*  GLUCOSE 86 96  BUN 19 22  CREATININE 1.31* 1.35*  CALCIUM 9.5 9.7    Liver Function Tests No results for input(s): AST, ALT, ALKPHOS, BILITOT, PROT, ALBUMIN in the last 72 hours. No results for input(s): LIPASE, AMYLASE in the last 72 hours. Cardiac Enzymes No results for input(s): CKTOTAL, CKMB, CKMBINDEX, TROPONINI in the last 72 hours.  BNP: BNP (last 3 results) No results for input(s): BNP in the last 8760 hours.  ProBNP (last 3 results) No results for input(s): PROBNP in the last 8760 hours.   D-Dimer No results for input(s): DDIMER in the last 72 hours. Hemoglobin A1C Recent Labs    11/07/20 1452  HGBA1C 6.5*    Fasting Lipid Panel No results for input(s): CHOL, HDL, LDLCALC, TRIG, CHOLHDL, LDLDIRECT in the last 72 hours. Thyroid Function Tests No results for input(s): TSH, T4TOTAL, T3FREE, THYROIDAB in the last 72 hours.  Invalid input(s): FREET3  Other results:   Imaging    CT HEAD WO CONTRAST  Result  Date: 11/08/2020 CLINICAL DATA:  Syncopal episode with dizziness. EXAM: CT HEAD WITHOUT CONTRAST TECHNIQUE: Contiguous axial images were obtained from the base of the skull through the vertex without intravenous contrast. COMPARISON:  None. FINDINGS: Brain: There is no evidence of an acute infarct, intracranial hemorrhage, midline shift, or extra-axial fluid collection. The ventricles and sulci are normal. There is an approximately 2.2 x 2.2 x 2.9 cm (AP x transverse x craniocaudal) mass which expands the sella and  extends into the suprasellar cistern superiorly displacing the optic chiasm. Vascular: No hyperdense vessel. Skull: No fracture or suspicious osseous lesion. Sinuses/Orbits: Mild right anterior ethmoid air cell mucosal thickening. Clear mastoid air cells. Unremarkable orbits. Other: None. IMPRESSION: 1. 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 Pituitary protocol brain MRI (without and with contrast) is recommended for further evaluation. 2. No evidence of acute intracranial abnormality. Electronically Signed   By: Logan Bores M.D.   On: 11/08/2020 14:21     Medications:     Scheduled Medications:  amiodarone  400 mg Oral BID   apixaban  5 mg Oral BID   atorvastatin  40 mg Oral q1800   hydrocortisone cream   Topical TID   loratadine  10 mg Oral Daily   metoprolol succinate  50 mg Oral q1800   pneumococcal 23 valent vaccine  0.5 mL Intramuscular Tomorrow-1000   sodium chloride flush  3 mL Intravenous Q12H   spironolactone  12.5 mg Oral QHS    Infusions:  sodium chloride      PRN Medications: sodium chloride, acetaminophen, diphenhydrAMINE, meclizine, ondansetron (ZOFRAN) IV, sodium chloride flush   Assessment/Plan   1. Acute systolic HF - Echo on 7/49/4496: LVEF 20-25% with normal left and right atrial size, mild aortic valve sclerosis with no stenosis, mild to moderate mitral regurgitation, trace tricuspid regurgitation and no pericardial effusion.  - Cath on 7/16: No CAD  Normal LVEDP. 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 - S/p TEE guided cardioversion 07/18 - Diuresed with 40 mg lasix IV once. - Appears euvolemic. Not requiring loop diuretic. - Complaining of lightheadedness and had soft systolic pressures on 75/91. Holding entresto. Orthostatics negative. No change in symptoms after holding med. BP in 110s/70s today  - Continue Toprol 50 daily  - Continue Spiro 12.5 mg daily - No SGLT2i for now due to  dizziness - Will need repeat echo in 4-6 weeks to reassess LVEF after restoration of sinus rhythm. - F/u HF clinic scheduled on 08/02 at 10:30.    2. Atrial flutter -New diagnosis this admission. - s/p DCCV on 7/18 -- will need to continue anticoagulation  -Moderate to severe LAE on TEE. -Anticoagulation with Eliquis 5 mg BID. Will need to remain on anticoagulation for at least three weeks post-DCCV unless emergent need for surgical or other intervention  -Loaded with IV amiodarone. S/p TEE/DC-CV 11/07/2020. Maintaining sinus rhythm. -Continue po 400 mg BID X 1 week, 200 mg BID X 1 week, then 200 mg daily. Not planning to use long-term. -Continue Toprol 50 mg daily. -To see EP in f/u as outpatient for consideration of ablation. F/u scheduled. -Mild snoring. Sleep study as outpatient. -Denies ETOH. -TSH normal.    3. Hyperlipidemia - Lipid panel, CHO-251, LDL-173. - Continue Lipitor - Needs FLP and AST/ALT in 6-8 weeks.   4. Diabetes - A1c 6.5% - Needs PCP follow-up   5. HTN -BP 110-120s/70s-80s -Continue Toprol-XL    6. Dizziness/syncope -Syncopal episode reported  on 07/11 -Sensation of room spinning when closing eyes and turning head that persists despite addition of PRN midodrine and permissive hypertension -BP high yesterday at 171/110, but now ranging from 110s-120s/70s-80s - Continue Toprol-XL at 50 mg daily  -Hgb stable  -Bradycardia reported at outside clinic (no ECG)- may need monitor at discharge to r/o significant bradyarrhythmias. No significant pauses identified on telemetry  - suspect may be related to oral amiodarone   7. Pituitary macroadenoma - Noted on MRI on 11/09/2020 - Neurology consulted and recommending additional evaluation by both neurosurgery and ophthalmology.  FSH, LH, prolactin and insulin-like growth factor ordered by neurology - No embolic event noted on MRI  - Per neurosurgery: will f/u in clinic since tumor is slow growing and no concerning  acute symptoms  - Per ophtho: will need formal visual field evaluation that requires an office visit -- appointment set up for 11/21/2020 at 1:40 at [place]: -Pharmacist consulted to assist with medications. Has medicaid. Potentially has other coverage. Wife bringing insurance cards today. -TOC consult - needs PCP referral, new diagnosis of diabetes.  Length of Stay: [place], NP  11/09/2020, 9:21 AM  Advanced Heart Failure Team Pager [phone] (M-F; 7a - 5p)  Please contact [facility] for night-coverage after hours (5p -7a ) and weekends on [email or web address]  Darcella Gasman, NP  9:21 AM  Patient seen and examined with the above-signed Advanced Practice Provider and/or Housestaff. I personally reviewed laboratory data, imaging studies and relevant notes. I independently examined the patient and formulated the important aspects of the plan. I have edited the note to reflect any of my changes or salient points. I have personally discussed the plan with the patient and/or family.  Remains in NSR. Denies SOB, orthopnea or PND. Had severe HA this am. Brain MRI with 3cm pituitary adenoma. Has been seen by Neurology and NSU.   General:  Sitting up. No resp difficulty HEENT: normal Neck: supple. no JVD. Carotids 2+ bilat; no bruits. No lymphadenopathy or thryomegaly appreciated. Cor: PMI nondisplaced. Regular rate & rhythm. No rubs, gallops or murmurs. Lungs: clear Abdomen: soft, nontender, nondistended. No hepatosplenomegaly. No bruits or masses. Good bowel sounds. Extremities: no cyanosis, clubbing, rash, edema Neuro: alert & orientedx3, cranial nerves grossly intact. moves all 4 extremities w/o difficulty. Affect pleasant  Stable from a cardiac standpoint. Remains in NSR after DC-CV. No volume overload. HF meds limited by low BP. Brain MRO results as above. Appreciate Neurology and NSU input. Await results of pituitary labs. Ideally would not come off of anticoagulation for at  least 3 weeks.   Suspect dizziness due to oral amio will cut back to 200 bid  Glori Bickers, MD  1:13 PM

## 2020-11-09 NOTE — Consult Note (Signed)
Neurosurgery Consultation  Reason for Consult: Pituitary mass Referring Physician: [place]  CC: Heart failure  HPI: This is a 60 y.o. man that presented with dizziness and syncope, found to have acute CHF with PAF + RVR with chest pain and DOE. He has also had some mild headaches and one day of nausea. He does endorse slowly progressive visual loss L>R. Pituitary labs are drawn and still pending. Endocrine ROS negative for other pituitary symptoms.    ROS: A 14 point ROS was performed and is negative except as noted in the HPI.   PMHx:  Past Medical History:  Diagnosis Date   HTN (hypertension)    FamHx:  Family History  Problem Relation Age of Onset   Asthma Father    COPD Father    SocHx:  reports that he has never smoked. He has never used smokeless tobacco. No history on file for alcohol use and drug use.  Exam: Vital signs in last 24 hours: Temp:  [97.7 F (36.5 C)-98 F (36.7 C)] 97.9 F (36.6 C) (07/20 1210) Pulse Rate:  [53-63] 60 (07/20 1210) Resp:  [16-20] 20 (07/20 1210) BP: (112-140)/(74-96) 124/82 (07/20 1210) SpO2:  [95 %-98 %] 95 % (07/20 0804) Weight:  [77 kg] 77 kg (07/20 0517) General: Awake, alert, cooperative, lying in bed in NAD Head: Normocephalic and atruamatic HEENT: Neck supple Pulmonary: breathing room air comfortably, no evidence of increased work of breathing Psych: affect full and reactive Abdomen: S NT ND Extremities: Warm and well perfused x4 Neuro: AOx3, PERRL, EOMI, FS & SS, visual fields with bitemporal hemianopsia fairly dense OU but good VA in nasal fields Strength 5/5 x4, SILTx4 except some mild diffuse numbness of the LUE + LLE that's non-dermatomal and spares the face and trunk, no hoffman's, no clonus   Assessment and Plan: 60 y.o. man w/ acute CHF and PAF+RVR, CTH obtained due to dizziness and syncope, MRI sella protocol obtained due to [facility] findings. MRI personally reviewed, which shows enhancing pituitary mass measuring  2.9x2.2x2.0cm with significant mass effect on the chiasm, no clear SWI changes c/w apoplexy.   -no acute neurosurgical intervention indicated at this time, discussed w/ the pt and his family, reviewed the imaging findings with them and answered questions -pit panel still pending but clinically appears asymptomatic unless he has a GH-oma causing his CHF, should be elective resection for the visual deficits but in a delayed fashion given his acute cardiac issues, will have him f/u w/ me in 6 weeks, already scheduled to see ophtho for formal visual fields -please call with any concerns or questions  Judith Part, MD 11/09/20 12:15 PM [facility]

## 2020-11-10 ENCOUNTER — Encounter (HOSPITAL_COMMUNITY): Payer: Self-pay | Admitting: Cardiology

## 2020-11-10 ENCOUNTER — Telehealth (HOSPITAL_COMMUNITY): Payer: Self-pay | Admitting: Pharmacy Technician

## 2020-11-10 ENCOUNTER — Other Ambulatory Visit (HOSPITAL_COMMUNITY): Payer: Self-pay

## 2020-11-10 LAB — CBC
HCT: 41.9 % (ref 39.0–52.0)
Hemoglobin: 14 g/dL (ref 13.0–17.0)
MCH: 27.6 pg (ref 26.0–34.0)
MCHC: 33.4 g/dL (ref 30.0–36.0)
MCV: 82.5 fL (ref 80.0–100.0)
Platelets: 236 10*3/uL (ref 150–400)
RBC: 5.08 MIL/uL (ref 4.22–5.81)
RDW: 13.8 % (ref 11.5–15.5)
WBC: 6.5 10*3/uL (ref 4.0–10.5)
nRBC: 0 % (ref 0.0–0.2)

## 2020-11-10 LAB — PROLACTIN: Prolactin: 35.1 ng/mL — ABNORMAL HIGH (ref 4.0–15.2)

## 2020-11-10 LAB — INSULIN-LIKE GROWTH FACTOR: Somatomedin C: 126 ng/mL (ref 64–240)

## 2020-11-10 LAB — ACTH: C206 ACTH: 19.6 pg/mL (ref 7.2–63.3)

## 2020-11-10 LAB — BASIC METABOLIC PANEL
Anion gap: 9 (ref 5–15)
BUN: 21 mg/dL (ref 8–23)
CO2: 24 mmol/L (ref 22–32)
Calcium: 9.4 mg/dL (ref 8.9–10.3)
Chloride: 102 mmol/L (ref 98–111)
Creatinine, Ser: 1.32 mg/dL — ABNORMAL HIGH (ref 0.61–1.24)
GFR, Estimated: >60 mL/min (ref >60)
Glucose, Bld: 88 mg/dL (ref 70–99)
Potassium: 4.4 mmol/L (ref 3.5–5.1)
Sodium: 135 mmol/L (ref 135–145)

## 2020-11-10 LAB — FOLLICLE STIMULATING HORMONE: FSH: 18.2 m[IU]/mL — ABNORMAL HIGH (ref 1.5–12.4)

## 2020-11-10 LAB — LUTEINIZING HORMONE: LH: 8.2 m[IU]/mL (ref 1.7–8.6)

## 2020-11-10 MED ORDER — COVID-19 MRNA VAC-TRIS(PFIZER) 30 MCG/0.3ML IM SUSP
0.3000 mL | Freq: Once | INTRAMUSCULAR | Status: AC
  Administered 2020-11-10: 0.3 mL via INTRAMUSCULAR
  Filled 2020-11-10: qty 0.3

## 2020-11-10 MED ORDER — LOSARTAN POTASSIUM 25 MG PO TABS: 25.0000 mg | ORAL_TABLET | Freq: Every day | ORAL | 1 refills | Status: DC

## 2020-11-10 MED ORDER — AMIODARONE HCL 200 MG PO TABS: ORAL_TABLET | ORAL | 1 refills | Status: DC

## 2020-11-10 MED ORDER — ATORVASTATIN CALCIUM 40 MG PO TABS: 40.0000 mg | ORAL_TABLET | Freq: Every day | ORAL | 3 refills | Status: DC

## 2020-11-10 MED ORDER — METOPROLOL SUCCINATE ER 50 MG PO TB24: 50.0000 mg | ORAL_TABLET | Freq: Every day | ORAL | 1 refills | Status: DC

## 2020-11-10 MED ORDER — APIXABAN 5 MG PO TABS: 5.0000 mg | ORAL_TABLET | Freq: Two times a day (BID) | ORAL | 5 refills | Status: DC

## 2020-11-10 MED ORDER — SPIRONOLACTONE 25 MG PO TABS: 12.5000 mg | ORAL_TABLET | Freq: Every day | ORAL | 5 refills | Status: DC

## 2020-11-10 NOTE — Progress Notes (Addendum)
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 Head CT with 3 cm sellar/supracellar mass. 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  -Neurology and Neurosurgery consulted   No chest pain or dyspnea. Continues to have headaches and dizziness, but both have improved somewhat.     Objective:   Weight Range: 76.7 kg Body mass index is 27.29 kg/m.   Vital Signs:   Temp:  [97.6 F (36.4 C)-98.5 F (36.9 C)] 98.5 F (36.9 C) (07/21 0755) Pulse Rate:  [56-60] 56 (07/21 0755) Resp:  [17-20] 17 (07/21 0504) BP: (115-125)/(68-84) 115/84 (07/21 0755) SpO2:  [95 %] 95 % (07/21 0504) Weight:  [76.7 kg] 76.7 kg (07/21 0504) Last BM Date: 11/08/20  Weight change: Filed Weights   11/08/20 1058 11/09/20 0517 11/10/20 0504  Weight: 76.3 kg 77 kg 76.7 kg    Intake/Output:  No intake or output data in the 24 hours ending 11/10/20 0827    Physical Exam    General:  Well appearing. No resp difficulty. Appears stated age. HEENT: Normocephalic and atraumatic. Neck: Supple. No JVD. Carotids 2+ bilat; no bruits. No lymphadenopathy or thyromegaly appreciated. Cor: PMI nondisplaced. Regular rate and rhythm. No rubs, gallops or murmurs. Lungs: CTA bilaterally. Abdomen: Soft, nontender, nondistended. No hepatosplenomegaly. No bruits or masses. Good bowel sounds. Extremities: No cyanosis, clubbing, no edema Neuro: Alert & orientedx3, cranial nerves grossly intact with no focal neuro deficits. moves all 4 extremities w/o difficulty. Affect pleasant.   Telemetry   NSR 60s (personally reviewed)   Labs    CBC Recent Labs    11/09/20 0143 11/10/20 0259  WBC 8.0 6.5  HGB 14.2 14.0  HCT 43.6 41.9  MCV 82.4 82.5  PLT 253 338   Basic Metabolic Panel Recent Labs    11/09/20 0143 11/10/20 0259  NA 136 135   K 4.0 4.4  CL 103 102  CO2 21* 24  GLUCOSE 96 88  BUN 22 21  CREATININE 1.35* 1.32*  CALCIUM 9.7 9.4   Liver Function Tests No results for input(s): AST, ALT, ALKPHOS, BILITOT, PROT, ALBUMIN in the last 72 hours. No results for input(s): LIPASE, AMYLASE in the last 72 hours. Cardiac Enzymes No results for input(s): CKTOTAL, CKMB, CKMBINDEX, TROPONINI in the last 72 hours.  BNP: BNP (last 3 results) No results for input(s): BNP in the last 8760 hours.  ProBNP (last 3 results) No results for input(s): PROBNP in the last 8760 hours.   D-Dimer No results for input(s): DDIMER in the last 72 hours. Hemoglobin A1C Recent Labs    11/07/20 1452  HGBA1C 6.5*   Fasting Lipid Panel Recent Labs    11/09/20 1855  LDLDIRECT 171.4*   Thyroid Function Tests No results for input(s): TSH, T4TOTAL, T3FREE, THYROIDAB in the last 72 hours.  Invalid input(s): FREET3  Other results:   Imaging    MR BRAIN W WO CONTRAST  Result Date: 11/09/2020 CLINICAL DATA:  3 cm suprasellar mass EXAM: MRI HEAD WITHOUT AND WITH CONTRAST TECHNIQUE: Multiplanar, multiecho pulse sequences of the brain and surrounding structures were obtained without and with intravenous contrast. CONTRAST:  3.80mL GADAVIST GADOBUTROL 1 MMOL/ML IV SOLN COMPARISON:  Head CT from yesterday FINDINGS: Brain: Bilobed diffusely enhancing sellar and suprasellar mass indistinguishable from a normal pituitary gland. Craniocaudal span is 2.9 cm and there is prominent chiasmatic  compression and up lifting. Transverse span measures up to 2.1 cm. Anterior to posterior span is 22 mm. No cavernous sinus region invasion. No incidental infarct, hemorrhage, hydrocephalus, or collection. 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 Electronically Signed   By:  Monte Fantasia M.D.   On: 11/09/2020 11:53     Medications:     Scheduled Medications:  amiodarone  200 mg Oral BID   apixaban  5 mg Oral BID   atorvastatin  40 mg Oral q1800   hydrocortisone cream   Topical TID   loratadine  10 mg Oral Daily   metoprolol succinate  50 mg Oral q1800   pneumococcal 23 valent vaccine  0.5 mL Intramuscular Tomorrow-1000   sodium chloride flush  3 mL Intravenous Q12H   spironolactone  12.5 mg Oral QHS    Infusions:  sodium chloride      PRN Medications: sodium chloride, acetaminophen, diphenhydrAMINE, meclizine, ondansetron (ZOFRAN) IV, sodium chloride flush   Assessment/Plan   1. Acute systolic HF - Echo on 09/27/3708: LVEF 20-25% with normal left and right atrial size, mild aortic valve sclerosis with no stenosis, mild to moderate mitral regurgitation, trace tricuspid regurgitation and no pericardial effusion.  - Cath on 7/16: No CAD  Normal LVEDP. 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 - S/p TEE guided cardioversion 07/18 - Diuresed with 40 mg lasix IV once. - Appears euvolemic. Not requiring loop diuretic. - Complaining of lightheadedness and had soft BP 07/18. Held entresto. Orthostatics negative. No change in symptoms after holding med.  - BP stable today. Add back Entresto 24/26 mg BID at D/C. - Continue Toprol 50 daily  - Continue Spiro 12.5 mg daily - No SGLT2i for now due to dizziness - Will need repeat echo in 4-6 weeks to reassess LVEF after restoration of sinus rhythm. - F/u HF clinic scheduled on 08/02 at 10:30.    2. Atrial flutter -New diagnosis this admission. - s/p DCCV on 7/18 - maintaining sinus rhythm.  -Moderate to severe LAE on TEE. -Anticoagulation with Eliquis 5 mg BID. Will need to remain on anticoagulation for at least three weeks post-DCCV unless emergent need for surgical or other intervention. -Loaded with IV amiodarone. S/p TEE/DC-CV 11/07/2020. Maintaining sinus  rhythm. -Amiodarone decreased to 200 mg BID yesterday, suspect may be contributing to dizziness -Continue Toprol 50 mg daily. -To see EP in f/u as outpatient for consideration of ablation. F/u scheduled. -Mild snoring. Sleep study as outpatient. -Denies ETOH. -TSH normal.    3. Hyperlipidemia - Lipid panel, CHO-251, LDL-173. - Continue Lipitor - Needs FLP and AST/ALT in 6-8 weeks.   4. Diabetes - A1c 6.5% - Needs PCP follow-up   5. HTN -BP stable. -Continue Toprol-XL and Spiro.   6. Dizziness/syncope -Syncopal episode reported on 07/11 -Persistent dizziness despite addition of PRN meclizine and permissive hypertension -Hgb stable  -Bradycardia reported at outside clinic (no ECG)- may need monitor at discharge to r/o significant bradyarrhythmias. No significant pauses identified on telemetry. -Suspect may be related to oral amiodarone - decreased as above -No embolic event on neuroimaging.  7. 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 Appreciate input. FSH, LH, prolactin, ACTH, urine free cortisol and insulin-like growth factor ordered. Elevated FSH and prolactin. Will discuss with endocrine. -Evaluated by Neurosurgery -  will follow-up in clinic in 6 weeks -F/u with ophthalmology for visual field evaluation - scheduled  SDOH: -Pharmacist consulted to assist with medications. Has medicaid.  -TOC consult - confirmed outpatient specialty follow-ups covered under medicaid plan. No transportation needs.  Length of Stay: 6  FINCH, LINDSAY N, PA-C  11/10/2020, 8:27 AM  Advanced Heart Failure Team Pager [phone] (M-F; 7a - 5p)  Please contact [facility] for night-coverage after hours (5p -7a ) and weekends on [email or web address]  Patient seen and examined with the above-signed Advanced Practice Provider and/or Housestaff. I personally reviewed laboratory data, imaging studies and relevant notes. I independently examined the patient and formulated the  important aspects of the plan. I have edited the note to reflect any of my changes or salient points. I have personally discussed the plan with the patient and/or family.  He feels better. Denies CP or SOB. Dizziness improving. Remains in NSR.   General:  Well appearing. No resp difficulty HEENT: normal Neck: supple. no JVD. Carotids 2+ bilat; no bruits. No lymphadenopathy or thryomegaly appreciated. Cor: PMI nondisplaced. Regular rate & rhythm. No rubs, gallops or murmurs. Lungs: clear Abdomen: soft, nontender, nondistended. No hepatosplenomegaly. No bruits or masses. Good bowel sounds. Extremities: no cyanosis, clubbing, rash, edema Neuro: alert & orientedx3, cranial nerves grossly intact. moves all 4 extremities w/o difficulty. Affect pleasant  I think he is stable for d/c today with close outpatient f/u for his AF/HF as well as his pituitary tumor.   Agree with meds as above.   Glori Bickers, MD  4:57 PM

## 2020-11-10 NOTE — TOC Initial Note (Addendum)
Transition of Care [facility]) - Initial/Assessment Note    Patient Details  Name: Hunter Miller MRN: [id number] Date of Birth: 07-Oct-1960  Transition [facility]) CM/SW Contact:    Erenest Rasher, RN Phone Number: [phone] 11/10/2020, 10:27 AM  Clinical Narrative:                 TOC CM spoke to pt at bedside, dtr, Judson Roch was on the phone. States pt wife did contact Medicaid and they were showing he had [place] on file. Spoke to [employer] and that was his last [employer], [place] [phone] # [phone]. States he last filled meds on 09/28/2019. Message sent to attending that Medicaid claims maybe accepted now that wife has followed up with Medicaid.   Pt has appt with his PCP, Dr Spero Curb on 11/15/2020. Requesting dc summary be sent to PCP's office.   Dtr requesting pt have a Pfizer booster and pneumococcal vaccine. Wanted pt's RN to call her with dc instructions, Judson Roch [phone]. Explained she can review his chart on mychart an instruction will be on his AVS. Updated his Unit RN to call dtr at dc. Made aware his meds will come up from [place] prior to dc. Brother will be providing transportation home. Wife provides transportation to his appts. Dtr is requesting brochure on CHF and diet.   Expected Discharge Plan: Home/Self Care Barriers to Discharge: No Barriers Identified   Patient Goals and CMS Choice Patient states their goals for this hospitalization and ongoing recovery are:: feel better at home      Expected Discharge Plan and Services Expected Discharge Plan: Home/Self Care In-house Referral: Clinical Social Work Discharge Planning Services: CM Consult, HF Clinic, Medication Assistance   Living arrangements for the past 2 months: [place]                                      Prior Living Arrangements/Services Living arrangements for the past 2 months: Single Family Home Lives with:: Spouse Patient language and need  for interpreter reviewed:: Yes Do you feel safe going back to the place where you live?: Yes      Need for Family Participation in Patient Care: Yes (Comment) Care giver support system in place?: Yes (comment)   Criminal Activity/Legal Involvement Pertinent to Current Situation/Hospitalization: No - Comment as needed  Activities of Daily Living Home Assistive Devices/Equipment: None ADL Screening (condition at time of admission) Patient's cognitive ability adequate to safely complete daily activities?: Yes Is the patient deaf or have difficulty hearing?: No Does the patient have difficulty seeing, even when wearing glasses/contacts?: No Does the patient have difficulty concentrating, remembering, or making decisions?: No Patient able to express need for assistance with ADLs?: No Does the patient have difficulty dressing or bathing?: No Independently performs ADLs?: Yes (appropriate for developmental age) Does the patient have difficulty walking or climbing stairs?: No Weakness of Legs: None Weakness of Arms/Hands: None  Permission Sought/Granted Permission sought to share information with : Case Manager, PCP, Family Supports Permission granted to share information with : Yes, Verbal Permission Granted  Share Information with NAME: Calla Kicks  Permission granted to share info w AGENCY: PCP  Permission granted to share info w Relationship: daughter  Permission granted to share info w Contact Information: [phone]  Emotional Assessment Appearance:: Appears stated age Attitude/Demeanor/Rapport: Engaged Affect (typically observed):  Accepting, Pleasant Orientation: : Oriented to Self, Oriented to Place, Oriented to  Time, Oriented to Situation Alcohol / Substance Use: Never Used Psych Involvement: No (comment)  Admission diagnosis:  Acute combined systolic and diastolic heart failure (HCC) [I50.41] Patient Active Problem List   Diagnosis Date Noted   Pituitary adenoma ([place])     Visual disturbance    Other headache syndrome    Hyperlipidemia 11/07/2020   Chest pain, precordial 96/43/8381   Acute systolic CHF (congestive heart failure) ([place]) 11/04/2020   Atrial flutter ([place]) 11/04/2020   HTN (hypertension) 11/04/2020   PCP:  Gala Lewandowsky, MD Pharmacy:   [facility] [place], [address] AT [place] [address] [place] [address] Phone: [phone] Fax: [phone]  [facility] [address] [place] [address] Phone: [phone] Fax: [phone]     Social Determinants of Health (SDOH) Interventions Food Insecurity Interventions: Patient Refused Financial Strain Interventions: Patient Refused Housing Interventions: Intervention Not Indicated Transportation Interventions: Intervention Not Indicated  Readmission Risk Interventions No flowsheet data found.

## 2020-11-10 NOTE — TOC CM/SW Note (Signed)
TOC CM contacted [facility] and all meds are in stock. Faxed Eliquis 30 day free trial card to pharmacy. Dtr, Judson Roch will pick up pt's meds from pharmacy. They close at 6 pm. Provided pharmacy with new insurance information. Pt was provided with CHF brochure to share with his dtr about Heart healthy diet.   [place], [place] [facility] TOC CM [phone]

## 2020-11-10 NOTE — Progress Notes (Signed)
DC instructions reviewed with patient and daughter Hunter Miller. Both verbalized understanding. Medications at [facility] pharmacy  confirmed and ready for pick up. All belongings sent with patient. Patient dcd via wheelchair and transferred to brothers car [place].

## 2020-11-10 NOTE — Telephone Encounter (Signed)
Patient Advocate Encounter   Received notification from [facility] that prior authorization for Entresto 24-26mg  is required.   PA submitted on CoverMyMeds [place] Status is pending   Will continue to follow.

## 2020-11-10 NOTE — Plan of Care (Signed)

## 2020-11-11 ENCOUNTER — Other Ambulatory Visit (HOSPITAL_COMMUNITY): Payer: Self-pay | Admitting: *Deleted

## 2020-11-11 DIAGNOSIS — D352 Benign neoplasm of pituitary gland: Secondary | ICD-10-CM

## 2020-11-11 NOTE — Progress Notes (Signed)
  Referral placed     Consuelo Pandy, PA-C  Harvie Junior, CMA Please place referral to endocrinology, per Dr. Haroldine Laws for Pituitary tumor.Thank you

## 2020-11-16 ENCOUNTER — Telehealth: Payer: Self-pay | Admitting: Cardiology

## 2020-11-16 ENCOUNTER — Telehealth: Payer: Self-pay | Admitting: *Deleted

## 2020-11-16 ENCOUNTER — Other Ambulatory Visit (HOSPITAL_COMMUNITY): Payer: Self-pay

## 2020-11-16 NOTE — Telephone Encounter (Signed)
Patient's daughter call to say that her father has started a new medication and what to make sure its okay for him to take. The medication Doxycyclien '100mg'$ , and eye drop for dry eyes 68m. Please advise

## 2020-11-16 NOTE — Telephone Encounter (Signed)
Advanced Heart Failure Patient Advocate Encounter  Prior Authorization for Delene Loll has been approved.    PA#  [id number] Effective dates: 11/10/20 through 11/10/21  Patients co-pay is $4 (30 days)  Charlann Boxer, CPhT

## 2020-11-16 NOTE — Telephone Encounter (Signed)
Spoke with the patient's daughter and advised on recommendations from PharmD. Patient's daughter verbalized understanding.

## 2020-11-16 NOTE — Telephone Encounter (Signed)
Doxycycline and eyedrops are fine to take, no interactions with his other meds.

## 2020-11-22 ENCOUNTER — Other Ambulatory Visit: Payer: Self-pay

## 2020-11-22 ENCOUNTER — Telehealth (HOSPITAL_COMMUNITY): Payer: Self-pay | Admitting: Family Medicine

## 2020-11-22 ENCOUNTER — Encounter (HOSPITAL_COMMUNITY): Payer: Self-pay

## 2020-11-22 ENCOUNTER — Ambulatory Visit (HOSPITAL_COMMUNITY)
Admission: RE | Admit: 2020-11-22 | Discharge: 2020-11-22 | Disposition: A | Discharge status: Home / Self Care | Payer: Medicaid Other | Source: Ambulatory Visit | Attending: Family Medicine | Admitting: Family Medicine

## 2020-11-22 ENCOUNTER — Telehealth (HOSPITAL_COMMUNITY): Payer: Self-pay | Admitting: Pharmacy Technician

## 2020-11-22 ENCOUNTER — Other Ambulatory Visit (HOSPITAL_COMMUNITY): Payer: Self-pay

## 2020-11-22 VITALS — BP 144/70 | HR 54 | Wt 173.8 lb

## 2020-11-22 DIAGNOSIS — E119 Type 2 diabetes mellitus without complications: Secondary | ICD-10-CM | POA: Diagnosis not present

## 2020-11-22 DIAGNOSIS — R42 Dizziness and giddiness: Secondary | ICD-10-CM | POA: Insufficient documentation

## 2020-11-22 DIAGNOSIS — Z7901 Long term (current) use of anticoagulants: Secondary | ICD-10-CM | POA: Insufficient documentation

## 2020-11-22 DIAGNOSIS — I5022 Chronic systolic (congestive) heart failure: Secondary | ICD-10-CM | POA: Diagnosis present

## 2020-11-22 DIAGNOSIS — D352 Benign neoplasm of pituitary gland: Secondary | ICD-10-CM | POA: Diagnosis not present

## 2020-11-22 DIAGNOSIS — I4892 Unspecified atrial flutter: Secondary | ICD-10-CM | POA: Diagnosis not present

## 2020-11-22 DIAGNOSIS — E785 Hyperlipidemia, unspecified: Secondary | ICD-10-CM | POA: Insufficient documentation

## 2020-11-22 DIAGNOSIS — R0683 Snoring: Secondary | ICD-10-CM | POA: Insufficient documentation

## 2020-11-22 DIAGNOSIS — I10 Essential (primary) hypertension: Secondary | ICD-10-CM

## 2020-11-22 DIAGNOSIS — Z79899 Other long term (current) drug therapy: Secondary | ICD-10-CM | POA: Insufficient documentation

## 2020-11-22 DIAGNOSIS — Z596 Low income: Secondary | ICD-10-CM | POA: Diagnosis not present

## 2020-11-22 DIAGNOSIS — I110 Hypertensive heart disease with heart failure: Secondary | ICD-10-CM | POA: Diagnosis not present

## 2020-11-22 LAB — BASIC METABOLIC PANEL
Anion gap: 4 — ABNORMAL LOW (ref 5–15)
BUN: 17 mg/dL (ref 8–23)
CO2: 26 mmol/L (ref 22–32)
Calcium: 9.7 mg/dL (ref 8.9–10.3)
Chloride: 104 mmol/L (ref 98–111)
Creatinine, Ser: 1.23 mg/dL (ref 0.61–1.24)
GFR, Estimated: >60 mL/min (ref >60)
Glucose, Bld: 93 mg/dL (ref 70–99)
Potassium: 4.6 mmol/L (ref 3.5–5.1)
Sodium: 134 mmol/L — ABNORMAL LOW (ref 135–145)

## 2020-11-22 LAB — CBC
HCT: 43.6 % (ref 39.0–52.0)
Hemoglobin: 14.1 g/dL (ref 13.0–17.0)
MCH: 27.3 pg (ref 26.0–34.0)
MCHC: 32.3 g/dL (ref 30.0–36.0)
MCV: 84.5 fL (ref 80.0–100.0)
Platelets: 278 10*3/uL (ref 150–400)
RBC: 5.16 MIL/uL (ref 4.22–5.81)
RDW: 13.6 % (ref 11.5–15.5)
WBC: 8 10*3/uL (ref 4.0–10.5)
nRBC: 0 % (ref 0.0–0.2)

## 2020-11-22 MED ORDER — AMIODARONE HCL 200 MG PO TABS
200.0000 mg | ORAL_TABLET | Freq: Every day | ORAL | 1 refills | Status: DC
Start: 2020-11-22 — End: 2020-12-14

## 2020-11-22 MED ORDER — SACUBITRIL-VALSARTAN 24-26 MG PO TABS: 1.0000 | ORAL_TABLET | Freq: Two times a day (BID) | ORAL | Status: DC

## 2020-11-22 MED ORDER — ENTRESTO 24-26 MG PO TABS: 1.0000 | ORAL_TABLET | Freq: Two times a day (BID) | ORAL | 11 refills | Status: DC

## 2020-11-22 MED ORDER — DAPAGLIFLOZIN PROPANEDIOL 10 MG PO TABS: 10.0000 mg | ORAL_TABLET | Freq: Every day | ORAL | 11 refills | Status: DC

## 2020-11-22 NOTE — Progress Notes (Addendum)
Advanced Heart Failure Clinic Note    PCP: Gala Lewandowsky, MD HF Cardiologist: Dr. Haroldine Laws  HPI: [name] is a 60 y.o. male with history of possible right femoral artery versus cerebral angiogram in remote past (details not known), HTN, HLD, new diagnosis (as of 7/22) of atrial flutter and systolic heart failure.   He  presented to [facility] with chest pain. Echo with EF of 20-25%, mild AV sclerosis with no stenosis, mild to moderate MR, trace TR and no pericardial effusion (per chart review, echo report not available). He was diuresed with IV lasix. Also found to be in atrial flutter with RVR.    He was transferred to [facility] on 11/04/2020 for further management. R/LHC 11/04/20  with normal coronaries, normal LV filling pressures, normal right heart pressures, reduced CO, CI 2.26.  Initiated on GDMT with Entresto after ACEi washout, Toprol XL and spiro. Started anticoagulation with Eliquis and loaded with IV amiodarone.  Underwent successful TEE guided DCCV on 11/07/20. He was stable following the procedure. Had planned to discharge home but he was kept for monitoring due to significant dizziness. Entresto switched to losartan, and amio reduced to 200 mg bid. 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 He was evaluated by Neurology and Neurosurgery. No acute intervention felt to be warranted. Plan for outpatient follow up. Discharge weight 172 lbs.  Today he returns for HF follow up. Overall feeling fine, no further dizziness. Some SOB walking further distances on flat ground. Feels some palpitations occasionally.  Denies CP, edema, or PND/Orthopnea. Appetite ok. No fever or chills. Weight at home 171-172 pounds. Taking all medications. BP at home 150-170s.  Cardiac Studies: - Echo (7/22): EF of 20-25%, mild AV sclerosis with no stenosis, mild to moderate MR, trace TR and no pericardial effusion  - TEE/DCCV  (11/07/20) successful restoration of SR. - R/LHC (11/04/20): normal coronaries, normal LV filling pressures, normal right heart pressures, reduced CO.  Fick Cardiac Output 4.22 L/min  Fick Cardiac Output Index 2.26 (L/min)/BSA  RA A Wave 10 mmHg  RA V Wave 10 mmHg  RA Mean 7 mmHg  RV Systolic Pressure 27 mmHg  RV Diastolic Pressure 3 mmHg  RV EDP 5 mmHg  PA Systolic Pressure 27 mmHg  PA Diastolic Pressure 13 mmHg  PA Mean 21 mmHg  PW A Wave 19 mmHg  PW V Wave 25 mmHg  PW Mean 19 mmHg  LV Systolic Pressure 123XX123 mmHg  LV Diastolic Pressure 6 mmHg  LV EDP 9 mmHg    Review of Systems: [y] = yes, '[ ]'$  = no   General: Weight gain '[ ]'$ ; Weight loss '[ ]'$ ; Anorexia '[ ]'$ ; Fatigue '[ ]'$ ; Fever '[ ]'$ ; Chills '[ ]'$ ; Weakness '[ ]'$   Cardiac: Chest pain/pressure '[ ]'$ ; Resting SOB '[ ]'$ ; Exertional SOB '[ ]'$ ; Orthopnea '[ ]'$ ; Pedal Edema '[ ]'$ ; Palpitations [y]; Syncope '[ ]'$ ; Presyncope '[ ]'$ ; Paroxysmal nocturnal dyspnea'[ ]'$   Pulmonary: Cough '[ ]'$ ; Wheezing'[ ]'$ ; Hemoptysis'[ ]'$ ; Sputum '[ ]'$ ; Snoring '[ ]'$   GI: Vomiting'[ ]'$ ; Dysphagia'[ ]'$ ; Melena'[ ]'$ ; Hematochezia '[ ]'$ ; Heartburn'[ ]'$ ; Abdominal pain '[ ]'$ ; Constipation '[ ]'$ ; Diarrhea '[ ]'$ ; BRBPR '[ ]'$   GU: Hematuria'[ ]'$ ; Dysuria '[ ]'$ ; Nocturia'[ ]'$   Vascular: Pain in legs with walking [ y]; Pain in feet with lying flat '[ ]'$ ; Non-healing sores '[ ]'$ ; Stroke '[ ]'$ ; TIA '[ ]'$ ; Slurred speech '[ ]'$ ;  Neuro: Headaches'[ ]'$ ; Vertigo'[ ]'$ ; Seizures'[ ]'$ ;  Paresthesias'[ ]'$ ;Blurred vision '[ ]'$ ; Diplopia '[ ]'$ ; Vision changes '[ ]'$   Ortho/Skin: Arthritis '[ ]'$ ; Joint pain '[ ]'$ ; Muscle pain '[ ]'$ ; Joint swelling '[ ]'$ ; Back Pain '[ ]'$ ; Rash '[ ]'$   Psych: Depression'[ ]'$ ; Anxiety'[ ]'$   Heme: Bleeding problems '[ ]'$ ; Clotting disorders '[ ]'$ ; Anemia '[ ]'$   Endocrine: Diabetes Blue.Reese ]; Thyroid dysfunction'[ ]'$    Past Medical History:  Diagnosis Date   HTN (hypertension)    Current Outpatient Medications  Medication Sig Dispense Refill   amiodarone (PACERONE) 200 MG tablet Take 1 tablet twice a day for 5 days, then decrease to 1 tablet daily 40  tablet 1   apixaban (ELIQUIS) 5 MG TABS tablet Take 1 tablet (5 mg total) by mouth 2 (two) times daily. 60 tablet 5   atorvastatin (LIPITOR) 40 MG tablet Take 1 tablet (40 mg total) by mouth daily at 6 PM. 90 tablet 3   losartan (COZAAR) 25 MG tablet Take 1 tablet (25 mg total) by mouth daily. 30 tablet 1   metoprolol succinate (TOPROL-XL) 50 MG 24 hr tablet Take 1 tablet (50 mg total) by mouth daily at 6 PM. Take with or immediately following a meal. 30 tablet 1   spironolactone (ALDACTONE) 25 MG tablet Take 0.5 tablets (12.5 mg total) by mouth at bedtime. 30 tablet 5   No current facility-administered medications for this encounter.   No Known Allergies  Social History   Socioeconomic History   Marital status: Married    Spouse name: Not on file   Number of children: Not on file   Years of education: Not on file   Highest education level: Not on file  Occupational History   Not on file  Tobacco Use   Smoking status: Never   Smokeless tobacco: Never  Substance and Sexual Activity   Alcohol use: Never   Drug use: Never   Sexual activity: Not on file  Other Topics Concern   Not on file  Social History Narrative   Not on file   Social Determinants of Health   Financial Resource Strain: Medium Risk   Difficulty of Paying Living Expenses: Somewhat hard  Food Insecurity: Food Insecurity Present   Worried About Running Out of Food in the Last Year: Sometimes true   Ran Out of Food in the Last Year: Sometimes true  Transportation Needs: No Transportation Needs   Lack of Transportation (Medical): No   Lack of Transportation (Non-Medical): No  Physical Activity: Not on file  Stress: Not on file  Social Connections: Not on file  Intimate Partner Violence: Not on file   Family History  Problem Relation Age of Onset   Asthma Father    COPD Father    BP (!) 144/70   Pulse (!) 54   Wt 78.8 kg (173 lb 12.8 oz)   SpO2 98%   BMI 28.05 kg/m   Wt Readings from Last 3  Encounters:  11/22/20 78.8 kg (173 lb 12.8 oz)  11/10/20 76.7 kg (169 lb 1.6 oz)   PHYSICAL EXAM: General:  NAD. No resp difficulty HEENT: Normal Neck: Supple. No JVD. Carotids 2+ bilat; no bruits. No lymphadenopathy or thryomegaly appreciated. Cor: PMI nondisplaced. Regular rate & rhythm. No rubs, gallops or murmurs. Lungs: Clear Abdomen: Soft, nontender, nondistended. No hepatosplenomegaly. No bruits or masses. Good bowel sounds. Extremities: No cyanosis, clubbing, rash, edema Neuro: Alert & oriented x 3, cranial nerves grossly intact. Moves all 4 extremities w/o difficulty. Affect pleasant.  ECG: SB AVB pr  232 ms (personally reviewed).  ASSESSMENT & PLAN: 1. Chronic systolic HF - Echo on 123XX123: LVEF 20-25% with normal left and right atrial size, mild aortic valve sclerosis with no stenosis, mild to moderate mitral regurgitation, trace tricuspid regurgitation and no pericardial effusion.  - L/RHC (11/05/20): No CAD  Normal LVEDP. 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 - S/p TEE guided cardioversion 11/07/20 - NYHA II, volume good today. - Stop losartan. - Start Entresto 24/26 mg bid. - Continue Toprol 50 mg daily.  - Continue Spiro 12.5 mg daily. - Will need repeat echo in 4-6 weeks to reassess LVEF after restoration of sinus rhythm. - BMET today, repeat in 10 days.   2. Atrial flutter - New diagnosis this admission. - s/p TEE/DCCV on 7/18  - Moderate to severe LAE on TEE. - SB on ECG today.  - Anticoagulation with Eliquis 5 mg bid. Will need to remain on anticoagulation for at least three weeks post-DCCV unless emergent need for surgical or other intervention. - Decrease amio to 200 mg daily. - Continue Toprol 50 mg daily. - EP follow up scheduled for ablation consideration. - CBC today.  3. HLD - Continue statin. - Needs FLP and AST/ALT in 6-8 weeks.   4. DM II - A1c 6.5%. - Anticipate starting SGLT2i next.   5. HTN - Start  Entresto as above. - Continue current regimen.  6. Snoring - Will arrange for sleep study.   7. Dizziness/syncope - Syncopal episode reported on 10/31/20, no further events. - ? 2/2 to oral amiodarone - decreased as above. - Consider Zio if reoccurs.   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 - Follow up with Neurosurgery. - F/u with ophthalmology for visual field evaluation - scheduled.     Follow up in 3-4 weeks with APP for further medication titration, anticipate adding Farxiga at that time.    Will arrange for an echo in 4-6 weeks & follow up with Dr. Haroldine Laws.   Lena, FNP 11/22/20

## 2020-11-22 NOTE — Patient Instructions (Signed)
START Farxiga 10 mg, one tab daily  Labs today We will only contact you if something comes back abnormal or we need to make some changes. Otherwise no news is good news!  Labs needed in 7-10 days  Your physician has requested that you regularly monitor and record your blood pressure readings at home. Please use the same machine at the same time of day to check your readings and record them to bring to your follow-up visit. -please give our office a call if your systolic blood pressure (top number) is above 160 consistently   Your physician has recommended that you have a sleep study. This test records several body functions during sleep, including: brain activity, eye movement, oxygen and carbon dioxide blood levels, heart rate and rhythm, breathing rate and rhythm, the flow of air through your mouth and nose, snoring, body muscle movements, and chest and belly movement.   Your physician recommends that you schedule a follow-up appointment in: 6 weeks with Dr Haroldine Laws and echo  Your physician has requested that you have an echocardiogram. Echocardiography is a painless test that uses sound waves to create images of your heart. It provides your doctor with information about the size and shape of your heart and how well your heart's chambers and valves are working. This procedure takes approximately one hour. There are no restrictions for this procedure.   Do the following things EVERYDAY: Weigh yourself in the morning before breakfast. Write it down and keep it in a log. Take your medicines as prescribed Eat low salt foods--Limit salt (sodium) to 2000 mg per day.  Stay as active as you can everyday Limit all fluids for the day to less than 2 liters  milAt the [facility] Clinic, you and your health needs are our priority. As part of our continuing mission to provide you with exceptional heart care, we have created designated Provider Care Teams. These Care Teams include your  primary Cardiologist (physician) and Advanced Practice Providers (APPs- Physician Assistants and Nurse Practitioners) who all work together to provide you with the care you need, when you need it.   You may see any of the following providers on your designated Care Team at your next follow up: Dr Glori Bickers Dr Loralie Champagne Dr Patrice Paradise, NP Lyda Jester, [place] Ginnie Smart Audry Riles, PharmD   Please be sure to bring in all your medications bottles to every appointment.

## 2020-11-22 NOTE — Telephone Encounter (Signed)
Per Allena Katz Decrease Amiodarone to 200 mg daily F/u with APP/pharmacy in 3-4 weeks  Pt aware of all medication changes via daughter Return for visit 8/29

## 2020-11-22 NOTE — Telephone Encounter (Signed)
Called patient to discuss medication changes. He is to stop losartan and start Entresto 24/26 mg bid (PA has been completed and Rx sent to his pharmacy). I would like for him to hold off on starting Farxiga for now. Will consider starting at next visit.   Allena Katz, FNP-BC

## 2020-11-22 NOTE — Addendum Note (Signed)
Addended by: Ein Rijo, Sharlot Gowda on: 11/22/2020 05:12 PM   Modules accepted: Orders

## 2020-11-22 NOTE — Telephone Encounter (Signed)
Patient Advocate Encounter   Received notification from [facility] that prior authorization for Hunter Miller is required.   PA submitted on CoverMyMeds Key [id number] Status is pending   Will continue to follow.

## 2020-11-22 NOTE — Addendum Note (Signed)
Encounter addended by: Rafael Bihari, FNP on: 11/22/2020 4:02 PM  Actions taken: Medication long-term status modified, Order list changed, Clinical Note Signed

## 2020-11-22 NOTE — Progress Notes (Signed)
Patient Name: Hunter Miller        DOB: [birth year]      Height: 5'6"    Weight:173lb  Office Name:[facility]         Referring Provider:Jessica Milford,NP/Daniel Bensimhon, MD  Today's Date:11/22/2020   STOP BANG RISK ASSESSMENT S (snore) Have you been told that you snore?     YES   T (tired) Are you often tired, fatigued, or sleepy during the day?   NO  O (obstruction) Do you stop breathing, choke, or gasp during sleep? NO   P (pressure) Do you have or are you being treated for high blood pressure? YES   B (BMI) Is your body index greater than 35 kg/m? NO   A (age) Are you 36 years old or older? YES   N (neck) Do you have a neck circumference greater than 16 inches?   NO   G (gender) Are you a male? YES   TOTAL STOP/BANG "YES" ANSWERS                                                                        For Office Use Only              Procedure Order Form    YES to 3+ Stop Bang questions OR two clinical symptoms - patient qualifies for WatchPAT (CPT 95800)             Clinical Notes: Will consult Sleep Specialist and refer for management of therapy due to patient increased risk of Sleep Apnea. Ordering a sleep study due to the following two clinical symptoms:  Loud snoring R06.83History of high blood pressure R03.0    I understand that I am proceeding with a home sleep apnea test as ordered by my treating physician. I understand that untreated sleep apnea is a serious cardiovascular risk factor and it is my responsibility to perform the test and seek management for sleep apnea. I will be contacted with the results and be managed for sleep apnea by a local sleep physician. I will be receiving equipment and further instructions from [facility]. I shall promptly ship back the equipment via the included mailing label. I understand my insurance will be billed for the test and as the patient I am responsible for any insurance related out-of-pocket costs  incurred. I have been provided with written instructions and can call for additional video or telephonic instruction, with 24-hour availability of qualified personnel to answer any questions: Patient Help Desk [phone].  Patient Signature ______________________________________________________   Date______________________ Patient Telemedicine Verbal Consent

## 2020-11-24 NOTE — Telephone Encounter (Signed)
Advanced Heart Failure Patient Advocate Encounter  Prior Authorization for Wilder Glade has been approved.    PA# [id number] Effective dates: 11/22/20 through 11/22/21  Charlann Boxer, CPhT

## 2020-11-25 ENCOUNTER — Other Ambulatory Visit: Payer: Self-pay

## 2020-11-25 ENCOUNTER — Ambulatory Visit (INDEPENDENT_AMBULATORY_CARE_PROVIDER_SITE_OTHER): Payer: Medicaid Other | Admitting: Cardiology

## 2020-11-25 VITALS — BP 112/80 | HR 53 | Ht 66.0 in | Wt 171.6 lb

## 2020-11-25 DIAGNOSIS — I5022 Chronic systolic (congestive) heart failure: Secondary | ICD-10-CM

## 2020-11-25 DIAGNOSIS — D352 Benign neoplasm of pituitary gland: Secondary | ICD-10-CM | POA: Diagnosis not present

## 2020-11-25 DIAGNOSIS — I10 Essential (primary) hypertension: Secondary | ICD-10-CM | POA: Diagnosis not present

## 2020-11-25 DIAGNOSIS — I483 Typical atrial flutter: Secondary | ICD-10-CM

## 2020-11-25 NOTE — Patient Instructions (Addendum)
Medication Instructions:  Your physician recommends that you continue on your current medications as directed. Please refer to the Current Medication list given to you today. *If you need a refill on your cardiac medications before your next appointment, please call your pharmacy*  Lab Work: None ordered. If you have labs (blood work) drawn today and your tests are completely normal, you will receive your results only by: [place] (if you have MyChart) OR A paper copy in the mail If you have any lab test that is abnormal or we need to change your treatment, we will call you to review the results.  Testing/Procedures: Your physician has recommended that you have an ablation. Catheter ablation is a medical procedure used to treat some cardiac arrhythmias (irregular heartbeats). During catheter ablation, a long, thin, flexible tube is put into a blood vessel in your groin (upper thigh), or neck. This tube is called an ablation catheter. It is then guided to your heart through the blood vessel. Radio frequency waves destroy small areas of heart tissue where abnormal heartbeats may cause an arrhythmia to start. Please see the instruction sheet given to you today.   Follow-Up: [facility] and [facility] are our priority.  As part of our continuing mission to provide you with exceptional heart care, we have created designated Provider Care Teams.  These Care Teams include your primary Cardiologist (physician) and Advanced Practice Providers (APPs -  Physician Assistants and Nurse Practitioners) who all work together to provide you with the care you need, when you need it.  Your next appointment:    4 weeks with Dr. Quentin Ore after your ablation-January 10, 2021 at 12:15 pm  Cardiac electrophysiology: From cell to bedside (7th ed., pp. [id number]). [place], [place]: Elsevier.">  Cardiac Ablation Cardiac ablation is a procedure to destroy, or ablate, a small amount of heart  tissue in very specific places. The heart has many electrical connections. Sometimes these connections are abnormal and can cause the heart to beat very fast or irregularly. Ablating some of the areas that cause problems can improve the heart's rhythm or return it to normal. Ablation may be done for people who: Have Wolff-Parkinson-White syndrome. Have fast heart rhythms (tachycardia). Have taken medicines for an abnormal heart rhythm (arrhythmia) that were not effective or caused side effects. Have a high-risk heartbeat that may be life-threatening. During the procedure, a small incision is made in the neck or the groin, and a long, thin tube (catheter) is inserted into the incision and moved to the heart. Small devices (electrodes) on the tip of the catheter will send out electrical currents. A type of X-ray (fluoroscopy) will be used to help guide the catheter and to provide images of the heart. Tell a health care provider about: Any allergies you have. All medicines you are taking, including vitamins, herbs, eye drops, creams, and over-the-counter medicines. Any problems you or family members have had with anesthetic medicines. Any blood disorders you have. Any surgeries you have had. Any medical conditions you have, such as kidney failure. Whether you are pregnant or may be pregnant. What are the risks? Generally, this is a safe procedure. However, problems may occur, including: Infection. Bruising and bleeding at the catheter insertion site. Bleeding into the chest, especially into the sac that surrounds the heart. This is a serious complication. Stroke or blood clots. Damage to nearby structures or organs. Allergic reaction to medicines or dyes. Need for a permanent pacemaker if the normal electrical system is  damaged. A pacemaker is a small computer that sends electrical signals to the heart and helps your heart beat normally. The procedure not being fully effective. This may not be  recognized until months later. Repeat ablation procedures are sometimes done. What happens before the procedure? Medicines Ask your health care provider about: Changing or stopping your regular medicines. This is especially important if you are taking diabetes medicines or blood thinners. Taking medicines such as aspirin and ibuprofen. These medicines can thin your blood. Do not take these medicines unless your health care provider tells you to take them. Taking over-the-counter medicines, vitamins, herbs, and supplements. General instructions Follow instructions from your health care provider about eating or drinking restrictions. Plan to have someone take you home from the hospital or clinic. If you will be going home right after the procedure, plan to have someone with you for 24 hours. Ask your health care provider what steps will be taken to prevent infection. What happens during the procedure?  An IV will be inserted into one of your veins. You will be given a medicine to help you relax (sedative). The skin on your neck or groin will be numbed. An incision will be made in your neck or your groin. A needle will be inserted through the incision and into a large vein in your neck or groin. A catheter will be inserted into the needle and moved to your heart. Dye may be injected through the catheter to help your surgeon see the area of the heart that needs treatment. Electrical currents will be sent from the catheter to ablate heart tissue in desired areas. There are three types of energy that may be used to do this: Heat (radiofrequency energy). Laser energy. Extreme cold (cryoablation). When the tissue has been ablated, the catheter will be removed. Pressure will be held on the insertion area to prevent a lot of bleeding. A bandage (dressing) will be placed over the insertion area. The exact procedure may vary among health care providers and hospitals. What happens after the  procedure? Your blood pressure, heart rate, breathing rate, and blood oxygen level will be monitored until you leave the hospital or clinic. Your insertion area will be monitored for bleeding. You will need to lie still for a few hours to ensure that you do not bleed from the insertion area. Do not drive for 24 hours or as long as told by your health care provider. Summary Cardiac ablation is a procedure to destroy, or ablate, a small amount of heart tissue using an electrical current. This procedure can improve the heart rhythm or return it to normal. Tell your health care provider about any medical conditions you may have and all medicines you are taking to treat them. This is a safe procedure, but problems may occur. Problems may include infection, bruising, damage to nearby organs or structures, or allergic reactions to medicines. Follow your health care provider's instructions about eating and drinking before the procedure. You may also be told to change or stop some of your medicines. After the procedure, do not drive for 24 hours or as long as told by your health care provider. This information is not intended to replace advice given to you by your health care provider. Make sure you discuss any questions you have with your healthcare provider. Document Revised: 02/16/2019 Document Reviewed: 02/16/2019 Elsevier Patient Education  [place].

## 2020-11-25 NOTE — H&P (View-Only) (Signed)
Electrophysiology Office Follow up Visit Note:    Date:  11/25/2020   ID:  Hunter Miller, DOB 01/27/1961, MRN [id number]  PCP:  Sistasis, Rowena, MD  [facility] Cardiologist:  None  [facility] Electrophysiologist:  Jalan Fariss T Tamla Winkels, MD    Interval History:    Hunter Miller is a 60 y.o. male who presents for a follow up visit.  I met him when he was hospitalized in July with acutely decompensated heart failure secondary to tachycardia with his atrial flutter.  He was loaded on amiodarone and cardioverted while inpatient.  While on amiodarone, he is successfully maintain normal rhythm.  He tells me that he feels better since getting back in a normal rhythm with improved exercise tolerance.  His daughter Hunter Miller was on the phone today during our appointment.  He has been taking his Eliquis for stroke prophylaxis without any bleeding problems.     Past Medical History:  Diagnosis Date   HTN (hypertension)     Past Surgical History:  Procedure Laterality Date   CARDIOVERSION N/A 11/07/2020   Procedure: CARDIOVERSION;  Surgeon: Bensimhon, Daniel R, MD;  Location: [facility] ENDOSCOPY;  Service: Cardiovascular;  Laterality: N/A;   RIGHT/LEFT HEART CATH AND CORONARY ANGIOGRAPHY N/A 11/04/2020   Procedure: RIGHT/LEFT HEART CATH AND CORONARY ANGIOGRAPHY;  Surgeon: Jordan, Peter M, MD;  Location: [facility] INVASIVE CV LAB;  Service: Cardiovascular;  Laterality: N/A;   RIGHT/LEFT HEART CATH AND CORONARY/GRAFT ANGIOGRAPHY N/A 11/04/2020   Procedure: RIGHT/LEFT HEART CATH AND CORONARY/GRAFT ANGIOGRAPHY;  Surgeon: Jordan, Peter M, MD;  Location: [facility] INVASIVE CV LAB;  Service: Cardiovascular;  Laterality: N/A;   TEE WITHOUT CARDIOVERSION N/A 11/07/2020   Procedure: TRANSESOPHAGEAL ECHOCARDIOGRAM (TEE);  Surgeon: Bensimhon, Daniel R, MD;  Location: [facility] ENDOS[facility];  Service: Cardiovascular;  Laterality: N/A;    Current Medications: Current Meds  Medication Sig   amiodarone (PACERONE) 200 MG tablet Take 1  tablet (200 mg total) by mouth daily.   apixaban (ELIQUIS) 5 MG TABS tablet Take 1 tablet (5 mg total) by mouth 2 (two) times daily.   atorvastatin (LIPITOR) 40 MG tablet Take 1 tablet (40 mg total) by mouth daily at 6 PM.   metoprolol succinate (TOPROL-XL) 50 MG 24 hr tablet Take 1 tablet (50 mg total) by mouth daily at 6 PM. Take with or immediately following a meal.   sacubitril-valsartan (ENTRESTO) 24-26 MG Take 1 tablet by mouth 2 (two) times daily.   spironolactone (ALDACTONE) 25 MG tablet Take 0.5 tablets (12.5 mg total) by mouth at bedtime.     Allergies:   Patient has no known allergies.   Social History   Socioeconomic History   Marital status: Married    Spouse name: Not on file   Number of children: Not on file   Years of education: Not on file   Highest education level: Not on file  Occupational History   Not on file  Tobacco Use   Smoking status: Never   Smokeless tobacco: Never  Substance and Sexual Activity   Alcohol use: Never   Drug use: Never   Sexual activity: Not on file  Other Topics Concern   Not on file  Social History Narrative   Not on file   Social Determinants of Health   Financial Resource Strain: Medium Risk   Difficulty of Paying Living Expenses: Somewhat hard  Food Insecurity: Food Insecurity Present   Worried About Running Out of Food in the Last Year: Sometimes true   Ran Out of Food in the   Last Year: Sometimes true  Transportation Needs: No Transportation Needs   Lack of Transportation (Medical): No   Lack of Transportation (Non-Medical): No  Physical Activity: Not on file  Stress: Not on file  Social Connections: Not on file     Family History: The patient's family history includes Asthma in his father; COPD in his father.  ROS:   Please see the history of present illness.    All other systems reviewed and are negative.  EKGs/Labs/Other Studies Reviewed:    The following studies were reviewed today: Hospital  records Hospital telemetry showing typical atrial flutter  EKG from August 2 shows sinus rhythm with a first-degree AV delay and lateral precordial T wave inversions.  Recent Labs: 11/04/2020: Magnesium 2.1; TSH 0.847 11/22/2020: BUN 17; Creatinine, Ser 1.23; Hemoglobin 14.1; Platelets 278; Potassium 4.6; Sodium 134  Recent Lipid Panel    Component Value Date/Time   LDLDIRECT 171.4 (H) 11/09/2020 1855    Physical Exam:    VS:  BP 112/80   Pulse (!) 53   Ht 5' 6" (1.676 m)   Wt 171 lb 9.6 oz (77.8 kg)   SpO2 96%   BMI 27.70 kg/m     Wt Readings from Last 3 Encounters:  11/25/20 171 lb 9.6 oz (77.8 kg)  11/22/20 173 lb 12.8 oz (78.8 kg)  11/10/20 169 lb 1.6 oz (76.7 kg)     GEN:  Well nourished, well developed in no acute distress HEENT: Normal NECK: No JVD; No carotid bruits LYMPHATICS: No lymphadenopathy CARDIAC: RRR, no murmurs, rubs, gallops RESPIRATORY:  Clear to auscultation without rales, wheezing or rhonchi  ABDOMEN: Soft, non-tender, non-distended MUSCULOSKELETAL:  No edema; No deformity  SKIN: Warm and dry NEUROLOGIC:  Alert and oriented x 3 PSYCHIATRIC:  Normal affect   ASSESSMENT:    1. Chronic systolic heart failure (HCC)   2. Typical atrial flutter (HCC)   3. Hypertension, unspecified type   4. Pituitary macroadenoma (HCC)    PLAN:    In order of problems listed above:   1. Chronic systolic heart failure (HCC) NYHA class II-III.  Warm and dry on exam today.  Continue Toprol, Entresto, spironolactone. Follows with heart failure clinic. Rhythm control be very important here.  2. Typical atrial flutter (HCC) Likely contributed to his initial acute heart failure presentation.  Rhythm control is indicated.  Continue Eliquis for stroke prophylaxis.  I discussed long-term rhythm control strategies with the patient during today's visit including antiarrhythmic drug therapy versus ablation.  Given his young age, I would favor avoiding long-term exposure  to amiodarone.  I think he is overall a good candidate for atypical flutter ablation.  I would plan to perform the ablation and then stop the amiodarone.  If he had a recurrence of arrhythmia down the road, could always consider an alternative antiarrhythmic drug such as dofetilide.  I discussed the risks, recovery and efficacy of the flutter ablation during today's visit and he wishes to proceed.  Risk, benefits, and alternatives to EP study and radiofrequency ablation for atrial flutter were also discussed in detail today. These risks include but are not limited to stroke, bleeding, vascular damage, tamponade, perforation, damage to the esophagus, lungs, and other structures, pulmonary vein stenosis, worsening renal function, and death. The patient understands these risk and wishes to proceed.  We will therefore proceed with catheter ablation at the next available time.  Carto, ICE, anesthesia are requested for the procedure.    3. Hypertension, unspecified type Controlled.  Continue   current regimen.  4. Pituitary macroadenoma (HCC) I discussed this with his daughter during today's visit.  The neurosurgeon would like to address the microadenoma after his heart issues have been dealt with.  There is no rush to have the macroadenoma surgically removed according to the patient and his daughter.  We will plan to perform the flutter ablation.  We need to continue anticoagulation for 3 months after the flutter ablation has been completed.  At that point we could stop the anticoagulant and he could undergo a procedure to remove the macroadenoma.    After the flutter ablation, will need to consider long-term surveillance for atrial fibrillation using a loop recorder.  I will plan to discuss this with the patient and his daughter at a later date.     Total time spent with patient today 50 minutes. This includes reviewing records, evaluating the patient and coordinating care.   Medication Adjustments/Labs  and Tests Ordered: Current medicines are reviewed at length with the patient today.  Concerns regarding medicines are outlined above.  No orders of the defined types were placed in this encounter.  No orders of the defined types were placed in this encounter.    Signed, Renise Gillies, MD, FACC, FHRS 11/25/2020 5:26 PM    Electrophysiology [facility]  

## 2020-11-25 NOTE — Progress Notes (Signed)
Electrophysiology Office Follow up Visit Note:    Date:  11/25/2020   ID:  Hunter Miller, DOB Dec 31, 1960, MRN [id number]  PCP:  Gala Lewandowsky, MD  [facility] Cardiologist:  None  [facility] Electrophysiologist:  Vickie Epley, MD    Interval History:    Hunter Miller is a 60 y.o. male who presents for a follow up visit.  I met him when he was hospitalized in July with acutely decompensated heart failure secondary to tachycardia with his atrial flutter.  He was loaded on amiodarone and cardioverted while inpatient.  While on amiodarone, he is successfully maintain normal rhythm.  He tells me that he feels better since getting back in a normal rhythm with improved exercise tolerance.  His daughter Judson Roch was on the phone today during our appointment.  He has been taking his Eliquis for stroke prophylaxis without any bleeding problems.     Past Medical History:  Diagnosis Date   HTN (hypertension)     Past Surgical History:  Procedure Laterality Date   CARDIOVERSION N/A 11/07/2020   Procedure: CARDIOVERSION;  Surgeon: Jolaine Artist, MD;  Location: [facility] ENDOSCOPY;  Service: Cardiovascular;  Laterality: N/A;   RIGHT/LEFT HEART CATH AND CORONARY ANGIOGRAPHY N/A 11/04/2020   Procedure: RIGHT/LEFT HEART CATH AND CORONARY ANGIOGRAPHY;  Surgeon: Martinique, Peter M, MD;  Location: [facility] CV LAB;  Service: Cardiovascular;  Laterality: N/A;   RIGHT/LEFT HEART CATH AND CORONARY/GRAFT ANGIOGRAPHY N/A 11/04/2020   Procedure: RIGHT/LEFT HEART CATH AND CORONARY/GRAFT ANGIOGRAPHY;  Surgeon: Martinique, Peter M, MD;  Location: [facility] CV LAB;  Service: Cardiovascular;  Laterality: N/A;   TEE WITHOUT CARDIOVERSION N/A 11/07/2020   Procedure: TRANSESOPHAGEAL ECHOCARDIOGRAM (TEE);  Surgeon: Jolaine Artist, MD;  Location: [facility] ENDOSCOPY;  Service: Cardiovascular;  Laterality: N/A;    Current Medications: Current Meds  Medication Sig   amiodarone (PACERONE) 200 MG tablet Take 1  tablet (200 mg total) by mouth daily.   apixaban (ELIQUIS) 5 MG TABS tablet Take 1 tablet (5 mg total) by mouth 2 (two) times daily.   atorvastatin (LIPITOR) 40 MG tablet Take 1 tablet (40 mg total) by mouth daily at 6 PM.   metoprolol succinate (TOPROL-XL) 50 MG 24 hr tablet Take 1 tablet (50 mg total) by mouth daily at 6 PM. Take with or immediately following a meal.   sacubitril-valsartan (ENTRESTO) 24-26 MG Take 1 tablet by mouth 2 (two) times daily.   spironolactone (ALDACTONE) 25 MG tablet Take 0.5 tablets (12.5 mg total) by mouth at bedtime.     Allergies:   Patient has no known allergies.   Social History   Socioeconomic History   Marital status: Married    Spouse name: Not on file   Number of children: Not on file   Years of education: Not on file   Highest education level: Not on file  Occupational History   Not on file  Tobacco Use   Smoking status: Never   Smokeless tobacco: Never  Substance and Sexual Activity   Alcohol use: Never   Drug use: Never   Sexual activity: Not on file  Other Topics Concern   Not on file  Social History Narrative   Not on file   Social Determinants of Health   Financial Resource Strain: Medium Risk   Difficulty of Paying Living Expenses: Somewhat hard  Food Insecurity: Food Insecurity Present   Worried About Running Out of Food in the Last Year: Sometimes true   Ran Out of Food in the  Last Year: Sometimes true  Transportation Needs: No Transportation Needs   Lack of Transportation (Medical): No   Lack of Transportation (Non-Medical): No  Physical Activity: Not on file  Stress: Not on file  Social Connections: Not on file     Family History: The patient's family history includes Asthma in his father; COPD in his father.  ROS:   Please see the history of present illness.    All other systems reviewed and are negative.  EKGs/Labs/Other Studies Reviewed:    The following studies were reviewed today: [facility] telemetry showing typical atrial flutter  EKG from August 2 shows sinus rhythm with a first-degree AV delay and lateral precordial T wave inversions.  Recent Labs: 11/04/2020: Magnesium 2.1; TSH 0.847 11/22/2020: BUN 17; Creatinine, Ser 1.23; Hemoglobin 14.1; Platelets 278; Potassium 4.6; Sodium 134  Recent Lipid Panel    Component Value Date/Time   LDLDIRECT 171.4 (H) 11/09/2020 1855    Physical Exam:    VS:  BP 112/80   Pulse (!) 53   Ht _0  (1.676 m)   Wt 171 lb 9.6 oz (77.8 kg)   SpO2 96%   BMI 27.70 kg/m     Wt Readings from Last 3 Encounters:  11/25/20 171 lb 9.6 oz (77.8 kg)  11/22/20 173 lb 12.8 oz (78.8 kg)  11/10/20 169 lb 1.6 oz (76.7 kg)     GEN:  Well nourished, well developed in no acute distress HEENT: Normal NECK: No JVD; No carotid bruits LYMPHATICS: No lymphadenopathy CARDIAC: RRR, no murmurs, rubs, gallops RESPIRATORY:  Clear to auscultation without rales, wheezing or rhonchi  ABDOMEN: Soft, non-tender, non-distended MUSCULOSKELETAL:  No edema; No deformity  SKIN: Warm and dry NEUROLOGIC:  Alert and oriented x 3 PSYCHIATRIC:  Normal affect   ASSESSMENT:    1. Chronic systolic heart failure ([place])   2. Typical atrial flutter ([place])   3. 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 Chronic systolic heart failure (HCC) NYHA class II-III.  Warm and dry on exam today.  Continue Toprol, Entresto, spironolactone. Follows with heart failure clinic. Rhythm control be very important here.  2. Typical atrial flutter ([place]) Likely contributed to his initial acute heart failure presentation.  Rhythm control is indicated.  Continue Eliquis for stroke prophylaxis.  I discussed long-term rhythm control strategies with the patient during today's visit including antiarrhythmic drug therapy versus ablation.  Given his young age, I would favor avoiding long-term exposure  to amiodarone.  I think he is overall a good candidate for atypical flutter ablation.  I would plan to perform the ablation and then stop the amiodarone.  If he had a recurrence of arrhythmia down the road, could always consider an alternative antiarrhythmic drug such as dofetilide.  I discussed the risks, recovery and efficacy of the flutter ablation during today's visit and he wishes to proceed.  Risk, benefits, and alternatives to EP study and radiofrequency ablation for atrial flutter were also discussed in detail today. These risks include but are not limited to stroke, bleeding, vascular damage, tamponade, perforation, damage to the esophagus, lungs, and other structures, pulmonary vein stenosis, worsening renal function, and death. The patient understands these risk and wishes to proceed.  We will therefore proceed with catheter ablation at the next available time.  Carto, ICE, anesthesia are requested for the procedure.    3. Hypertension, unspecified type Controlled.  Continue  current regimen.  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  The neurosurgeon would like to address the microadenoma after his heart issues have been dealt with.  There is no rush to have the macroadenoma surgically removed according to the patient and his daughter.  We will plan to perform the flutter ablation.  We need to continue anticoagulation for 3 months after the flutter ablation has been completed.  At that point we could stop the anticoagulant and he could undergo a procedure to remove the macroadenoma.    After the flutter ablation, will need to consider long-term surveillance for atrial fibrillation using a loop recorder.  I will plan to discuss this with the patient and his daughter at a later date.     Total time spent with patient today 50 minutes. This includes reviewing records, evaluating the patient and coordinating care.   Medication Adjustments/Labs  and Tests Ordered: Current medicines are reviewed at length with the patient today.  Concerns regarding medicines are outlined above.  No orders of the defined types were placed in this encounter.  No orders of the defined types were placed in this encounter.    Signed, Lars Mage, MD, [facility], [facility] 11/25/2020 5:26 PM    Electrophysiology [facility]

## 2020-11-30 ENCOUNTER — Ambulatory Visit (HOSPITAL_COMMUNITY)
Admission: RE | Admit: 2020-11-30 | Discharge: 2020-11-30 | Disposition: A | Discharge status: Home / Self Care | Payer: Medicaid Other | Source: Ambulatory Visit | Attending: Internal Medicine | Admitting: Internal Medicine

## 2020-11-30 ENCOUNTER — Other Ambulatory Visit: Payer: Self-pay

## 2020-11-30 DIAGNOSIS — I5022 Chronic systolic (congestive) heart failure: Secondary | ICD-10-CM | POA: Diagnosis present

## 2020-11-30 LAB — BASIC METABOLIC PANEL
Anion gap: 7 (ref 5–15)
BUN: 16 mg/dL (ref 8–23)
CO2: 26 mmol/L (ref 22–32)
Calcium: 9.3 mg/dL (ref 8.9–10.3)
Chloride: 105 mmol/L (ref 98–111)
Creatinine, Ser: 1.21 mg/dL (ref 0.61–1.24)
GFR, Estimated: >60 mL/min (ref >60)
Glucose, Bld: 89 mg/dL (ref 70–99)
Potassium: 4.1 mmol/L (ref 3.5–5.1)
Sodium: 138 mmol/L (ref 135–145)

## 2020-12-08 ENCOUNTER — Encounter (HOSPITAL_COMMUNITY): Payer: Self-pay | Admitting: Cardiology

## 2020-12-08 ENCOUNTER — Other Ambulatory Visit: Payer: Self-pay

## 2020-12-08 ENCOUNTER — Encounter (HOSPITAL_COMMUNITY)
Admission: RE | Disposition: A | Discharge status: Home / Self Care | Payer: Self-pay | Source: Home / Self Care | Attending: Cardiology

## 2020-12-08 ENCOUNTER — Ambulatory Visit (HOSPITAL_COMMUNITY)
Admission: RE | Admit: 2020-12-08 | Discharge: 2020-12-08 | Disposition: A | Discharge status: Home / Self Care | Payer: Medicaid Other | Attending: Cardiology | Admitting: Cardiology

## 2020-12-08 ENCOUNTER — Ambulatory Visit (HOSPITAL_COMMUNITY): Payer: Medicaid Other | Admitting: Certified Registered Nurse Anesthetist

## 2020-12-08 DIAGNOSIS — Z7901 Long term (current) use of anticoagulants: Secondary | ICD-10-CM | POA: Insufficient documentation

## 2020-12-08 DIAGNOSIS — I483 Typical atrial flutter: Secondary | ICD-10-CM | POA: Insufficient documentation

## 2020-12-08 DIAGNOSIS — I5022 Chronic systolic (congestive) heart failure: Secondary | ICD-10-CM | POA: Insufficient documentation

## 2020-12-08 DIAGNOSIS — I4891 Unspecified atrial fibrillation: Secondary | ICD-10-CM | POA: Diagnosis not present

## 2020-12-08 DIAGNOSIS — Z79899 Other long term (current) drug therapy: Secondary | ICD-10-CM | POA: Insufficient documentation

## 2020-12-08 DIAGNOSIS — I110 Hypertensive heart disease with heart failure: Secondary | ICD-10-CM | POA: Insufficient documentation

## 2020-12-08 DIAGNOSIS — D352 Benign neoplasm of pituitary gland: Secondary | ICD-10-CM | POA: Diagnosis not present

## 2020-12-08 HISTORY — PX: A-FLUTTER ABLATION: EP1230

## 2020-12-08 SURGERY — A-FLUTTER ABLATION
Anesthesia: General

## 2020-12-08 MED ORDER — EPHEDRINE SULFATE 50 MG/ML IJ SOLN
INTRAMUSCULAR | Status: DC | PRN
  Administered 2020-12-08: 5 mg via INTRAVENOUS

## 2020-12-08 MED ORDER — HEPARIN (PORCINE) IN NACL 1000-0.9 UT/500ML-% IV SOLN
INTRAVENOUS | Status: DC | PRN
  Administered 2020-12-08: 500 mL

## 2020-12-08 MED ORDER — SUGAMMADEX SODIUM 200 MG/2ML IV SOLN
INTRAVENOUS | Status: DC | PRN
  Administered 2020-12-08: 150 mg via INTRAVENOUS

## 2020-12-08 MED ORDER — ACETAMINOPHEN 325 MG PO TABS: 650.0000 mg | ORAL_TABLET | ORAL | Status: DC | PRN

## 2020-12-08 MED ORDER — ETOMIDATE 2 MG/ML IV SOLN
INTRAVENOUS | Status: DC | PRN
  Administered 2020-12-08: 8 mg via INTRAVENOUS

## 2020-12-08 MED ORDER — PHENYLEPHRINE HCL (PRESSORS) 10 MG/ML IV SOLN
INTRAVENOUS | Status: DC | PRN
  Administered 2020-12-08: 80 ug via INTRAVENOUS
  Administered 2020-12-08: 120 ug via INTRAVENOUS

## 2020-12-08 MED ORDER — SODIUM CHLORIDE 0.9 % IV BOLUS: 500.0000 mL | Freq: Once | INTRAVENOUS | Status: DC

## 2020-12-08 MED ORDER — ROCURONIUM BROMIDE 10 MG/ML (PF) SYRINGE
PREFILLED_SYRINGE | INTRAVENOUS | Status: DC | PRN
  Administered 2020-12-08: 70 mg via INTRAVENOUS

## 2020-12-08 MED ORDER — APIXABAN 5 MG PO TABS
5.0000 mg | ORAL_TABLET | Freq: Two times a day (BID) | ORAL | Status: DC
  Administered 2020-12-08: 5 mg via ORAL
  Filled 2020-12-08: qty 1

## 2020-12-08 MED ORDER — ONDANSETRON HCL 4 MG/2ML IJ SOLN: 4.0000 mg | Freq: Four times a day (QID) | INTRAMUSCULAR | Status: DC | PRN

## 2020-12-08 MED ORDER — SODIUM CHLORIDE 0.9 % IV SOLN: INTRAVENOUS | Status: DC | PRN

## 2020-12-08 MED ORDER — PROPOFOL 10 MG/ML IV BOLUS
INTRAVENOUS | Status: DC | PRN
  Administered 2020-12-08: 50 mg via INTRAVENOUS

## 2020-12-08 MED ORDER — MIDAZOLAM HCL 5 MG/5ML IJ SOLN
INTRAMUSCULAR | Status: DC | PRN
Start: 2020-12-08 — End: 2020-12-08
  Administered 2020-12-08: 1 mg via INTRAVENOUS

## 2020-12-08 MED ORDER — SODIUM CHLORIDE 0.9% FLUSH: 3.0000 mL | Freq: Two times a day (BID) | INTRAVENOUS | Status: DC

## 2020-12-08 MED ORDER — PHENYLEPHRINE HCL-NACL 20-0.9 MG/250ML-% IV SOLN
INTRAVENOUS | Status: DC | PRN
  Administered 2020-12-08: 50 ug/min via INTRAVENOUS

## 2020-12-08 MED ORDER — FENTANYL CITRATE (PF) 250 MCG/5ML IJ SOLN
INTRAMUSCULAR | Status: DC | PRN
  Administered 2020-12-08: 50 ug via INTRAVENOUS

## 2020-12-08 MED ORDER — SODIUM CHLORIDE 0.9 % IV SOLN: INTRAVENOUS | Status: DC

## 2020-12-08 MED ORDER — ONDANSETRON HCL 4 MG/2ML IJ SOLN
INTRAMUSCULAR | Status: DC | PRN
  Administered 2020-12-08: 4 mg via INTRAVENOUS

## 2020-12-08 MED ORDER — LIDOCAINE 2% (20 MG/ML) 5 ML SYRINGE
INTRAMUSCULAR | Status: DC | PRN
Start: 2020-12-08 — End: 2020-12-08
  Administered 2020-12-08: 60 mg via INTRAVENOUS

## 2020-12-08 MED ORDER — HEPARIN SODIUM (PORCINE) 1000 UNIT/ML IJ SOLN
INTRAMUSCULAR | Status: AC
  Filled 2020-12-08: qty 1

## 2020-12-08 MED ORDER — SODIUM CHLORIDE 0.9 % IV SOLN: 250.0000 mL | INTRAVENOUS | Status: DC | PRN

## 2020-12-08 MED ORDER — HEPARIN SODIUM (PORCINE) 1000 UNIT/ML IJ SOLN
INTRAMUSCULAR | Status: DC | PRN
  Administered 2020-12-08: 1000 [IU] via INTRAVENOUS

## 2020-12-08 MED ORDER — SODIUM CHLORIDE 0.9% FLUSH: 3.0000 mL | INTRAVENOUS | Status: DC | PRN

## 2020-12-08 MED ORDER — HEPARIN (PORCINE) IN NACL 1000-0.9 UT/500ML-% IV SOLN
INTRAVENOUS | Status: AC
  Filled 2020-12-08: qty 500

## 2020-12-08 SURGICAL SUPPLY — 11 items
CATH SMTCH THERMOCOOL SF DF (CATHETERS) ×2 IMPLANT
CATH SOUNDSTAR ECO 8FR (CATHETERS) ×2 IMPLANT
CATH WEB BI DIR CSDF CRV REPRO (CATHETERS) ×2 IMPLANT
CLOSURE PERCLOSE PROSTYLE (VASCULAR PRODUCTS) ×6 IMPLANT
PACK EP LATEX FREE (CUSTOM PROCEDURE TRAY) ×1
PACK EP LF (CUSTOM PROCEDURE TRAY) ×1 IMPLANT
PATCH CARTO3 (PAD) ×2 IMPLANT
SHEATH PINNACLE 8F 10CM (SHEATH) ×4 IMPLANT
SHEATH PINNACLE 9F 10CM (SHEATH) ×2 IMPLANT
SHEATH PROBE COVER 6X72 (BAG) ×2 IMPLANT
TUBING SMART ABLATE COOLFLOW (TUBING) ×4 IMPLANT

## 2020-12-08 NOTE — Transfer of Care (Signed)
Immediate Anesthesia Transfer of Care Note  Patient: Hunter Miller  Procedure(s) Performed: A-FLUTTER ABLATION  Patient Location: Cath Lab  Anesthesia Type:General  Level of Consciousness: awake, alert  and responds to stimulation  Airway & Oxygen Therapy: Patient Spontanous Breathing and Patient connected to nasal cannula oxygen  Post-op Assessment: Report given to RN, Post -op Vital signs reviewed and stable and Patient moving all extremities X 4  Post vital signs: Reviewed and stable  Last Vitals:  Vitals Value Taken Time  BP 113/98 12/08/20 1319  Temp    Pulse 64 12/08/20 1321  Resp 19 12/08/20 1321  SpO2 96 % 12/08/20 1321  Vitals shown include unvalidated device data.  Last Pain:  Vitals:   12/08/20 0921  TempSrc: Oral  PainSc: 0-No pain         Complications: No notable events documented.

## 2020-12-08 NOTE — Anesthesia Procedure Notes (Signed)
Procedure Name: Intubation Date/Time: 12/08/2020 11:25 AM Performed by: Glynda Jaeger, CRNA Pre-anesthesia Checklist: Patient identified, Patient being monitored, Timeout performed, Emergency Drugs available and Suction available Patient Re-evaluated:Patient Re-evaluated prior to induction Oxygen Delivery Method: Circle System Utilized Preoxygenation: Pre-oxygenation with 100% oxygen Induction Type: IV induction Ventilation: Mask ventilation without difficulty Laryngoscope Size: Mac and 4 Grade View: Grade I Tube type: Oral Tube size: 7.5 mm Number of attempts: 1 Airway Equipment and Method: Stylet Placement Confirmation: ETT inserted through vocal cords under direct vision, positive ETCO2 and breath sounds checked- equal and bilateral Secured at: 21 cm Tube secured with: Tape Dental Injury: Teeth and Oropharynx as per pre-operative assessment

## 2020-12-08 NOTE — Anesthesia Preprocedure Evaluation (Addendum)
Anesthesia Evaluation  Patient identified by MRN, date of birth, ID band Patient awake    Reviewed: Allergy & Precautions, NPO status , Patient's Chart, lab work & pertinent test results  Airway Mallampati: II  TM Distance: >3 FB Neck ROM: Full    Dental no notable dental hx. (+) Partial Lower   Pulmonary    Pulmonary exam normal breath sounds clear to auscultation       Cardiovascular hypertension, Pt. on home beta blockers +CHF  Normal cardiovascular exam+ dysrhythmias (on Eliquis) Atrial Fibrillation  Rhythm:Irregular Rate:Normal  July 2020 ECHO: 1. Left ventricular ejection fraction, by estimation, is 20 to 25%. The  left ventricle has severely decreased function. The left ventricle  demonstrates global hypokinesis.  2. Right ventricular systolic function is moderately reduced. The right  ventricular size is normal.  3. Left atrial size was severely dilated. No left atrial/left atrial  appendage thrombus was detected.  4. The mitral valve is normal in structure. Mild to moderate mitral valve  regurgitation.  5. The aortic valve is tricuspid. Aortic valve regurgitation is not  visualized.    Neuro/Psych  Headaches, negative psych ROS   GI/Hepatic negative GI ROS, Neg liver ROS,   Endo/Other  negative endocrine ROS  Renal/GU negative Renal ROS  negative genitourinary   Musculoskeletal negative musculoskeletal ROS (+)   Abdominal   Peds negative pediatric ROS (+)  Hematology negative hematology ROS (+)   Anesthesia Other Findings   Reproductive/Obstetrics negative OB ROS                           Anesthesia Physical Anesthesia Plan  ASA: 4  Anesthesia Plan: General   Post-op Pain Management:    Induction: Intravenous  PONV Risk Score and Plan: 2 and Treatment may vary due to age or medical condition  Airway Management Planned: Oral ETT  Additional Equipment:  None  Intra-op Plan:   Post-operative Plan: Extubation in OR  Informed Consent: I have reviewed the patients History and Physical, chart, labs and discussed the procedure including the risks, benefits and alternatives for the proposed anesthesia with the patient or authorized representative who has indicated his/her understanding and acceptance.     Dental advisory given and Interpreter used for interveiw  Plan Discussed with: CRNA and Anesthesiologist  Anesthesia Plan Comments: (? [place]arsight for BP monitoring given low EF. GETA. Norton Blizzard, MD  )       Anesthesia Quick Evaluation

## 2020-12-08 NOTE — Interval H&P Note (Signed)
History and Physical Interval Note:  12/08/2020 10:40 AM  Hunter Miller  has presented today for surgery, with the diagnosis of aflutter.  The various methods of treatment have been discussed with the patient and family. After consideration of risks, benefits and other options for treatment, the patient has consented to  Procedure(s): A-FLUTTER ABLATION (N/A) as a surgical intervention.  The patient's history has been reviewed, patient examined, no change in status, stable for surgery.  I have reviewed the patient's chart and labs.  Questions were answered to the patient's satisfaction.     Jayna Mulnix T Nichele Slawson

## 2020-12-08 NOTE — Discharge Instructions (Signed)
Post procedure care instructions No driving for 4 days. No lifting over 5 lbs for 1 week. No vigorous or sexual activity for 1 week. You may return to work/your usual activities on 12/16/20. Keep procedure site clean & dry. If you notice increased pain, swelling, bleeding or pus, call/return!  You may shower after 24 hours, but no soaking in baths/hot tubs/pools for 1 week.

## 2020-12-08 NOTE — Anesthesia Postprocedure Evaluation (Signed)
Anesthesia Post Note  Patient: Hunter Miller  Procedure(s) Performed: A-FLUTTER ABLATION     Patient location during evaluation: PACU Anesthesia Type: General Level of consciousness: awake Pain management: pain level controlled Vital Signs Assessment: post-procedure vital signs reviewed and stable Respiratory status: spontaneous breathing and respiratory function stable Cardiovascular status: stable Postop Assessment: no apparent nausea or vomiting Anesthetic complications: no   No notable events documented.  Last Vitals:  Vitals:   12/08/20 1435 12/08/20 1440  BP: 92/70 91/68  Pulse: 60 (!) 53  Resp: 14 11  Temp:    SpO2: 93% 97%    Last Pain:  Vitals:   12/08/20 1321  TempSrc: Temporal  PainSc: 0-No pain                 Merlinda Frederick

## 2020-12-09 ENCOUNTER — Encounter (HOSPITAL_COMMUNITY): Payer: Self-pay | Admitting: Cardiology

## 2020-12-11 ENCOUNTER — Telehealth: Payer: Self-pay | Admitting: Home Health

## 2020-12-11 NOTE — Telephone Encounter (Signed)
Patient's daughter Hunter Miller called after-hours line reporting patient had fainted around 6 AM this morning.  Answering service request callback for patient date of birth 43 /04/1950, which does not exist, verified with patient's daughter that patient's date of birth is 5 /04/1960.  Daughter states patient had unwitnessed syncope episode this morning while going to the bathroom, patient does not have clear recollection of what happened, remembers waking up on the bathroom floor facing the ceiling.  Patient had right leg pain at the present moment, blood pressure is 126/71.  Advised patient's daughter to take patient to the nearest ER for evaluation, daughter states they do not like local ERs and wishes to come into [facility], advised daughter that if patient is hemodynamically stable with blood pressure, heart rate, without excruciating pain and limited range of motion of extremity/head and neck, she can transfer the patient, there was EMS should be called for transferring patient to the ER.  Daughter is agreeable.

## 2020-12-14 ENCOUNTER — Other Ambulatory Visit (HOSPITAL_COMMUNITY): Payer: Self-pay | Admitting: Internal Medicine

## 2020-12-14 ENCOUNTER — Ambulatory Visit (HOSPITAL_COMMUNITY)
Admission: RE | Admit: 2020-12-14 | Discharge: 2020-12-14 | Disposition: A | Discharge status: Home / Self Care | Payer: Medicaid Other | Source: Ambulatory Visit | Attending: Family Medicine | Admitting: Family Medicine

## 2020-12-14 ENCOUNTER — Ambulatory Visit (HOSPITAL_COMMUNITY)
Admission: RE | Admit: 2020-12-14 | Discharge: 2020-12-14 | Disposition: A | Discharge status: Home / Self Care | Payer: Medicaid Other | Source: Ambulatory Visit | Attending: Internal Medicine | Admitting: Internal Medicine

## 2020-12-14 ENCOUNTER — Other Ambulatory Visit: Payer: Self-pay

## 2020-12-14 ENCOUNTER — Encounter (HOSPITAL_COMMUNITY): Payer: Self-pay

## 2020-12-14 VITALS — BP 112/78 | HR 86 | Wt 169.0 lb

## 2020-12-14 DIAGNOSIS — Z79899 Other long term (current) drug therapy: Secondary | ICD-10-CM | POA: Diagnosis not present

## 2020-12-14 DIAGNOSIS — E785 Hyperlipidemia, unspecified: Secondary | ICD-10-CM | POA: Insufficient documentation

## 2020-12-14 DIAGNOSIS — Z7901 Long term (current) use of anticoagulants: Secondary | ICD-10-CM | POA: Insufficient documentation

## 2020-12-14 DIAGNOSIS — R0683 Snoring: Secondary | ICD-10-CM

## 2020-12-14 DIAGNOSIS — R0602 Shortness of breath: Secondary | ICD-10-CM | POA: Insufficient documentation

## 2020-12-14 DIAGNOSIS — I4892 Unspecified atrial flutter: Secondary | ICD-10-CM | POA: Diagnosis not present

## 2020-12-14 DIAGNOSIS — R42 Dizziness and giddiness: Secondary | ICD-10-CM | POA: Diagnosis present

## 2020-12-14 DIAGNOSIS — R1900 Intra-abdominal and pelvic swelling, mass and lump, unspecified site: Secondary | ICD-10-CM

## 2020-12-14 DIAGNOSIS — R1909 Other intra-abdominal and pelvic swelling, mass and lump: Secondary | ICD-10-CM | POA: Insufficient documentation

## 2020-12-14 DIAGNOSIS — E119 Type 2 diabetes mellitus without complications: Secondary | ICD-10-CM | POA: Diagnosis not present

## 2020-12-14 DIAGNOSIS — I483 Typical atrial flutter: Secondary | ICD-10-CM

## 2020-12-14 DIAGNOSIS — D352 Benign neoplasm of pituitary gland: Secondary | ICD-10-CM

## 2020-12-14 DIAGNOSIS — Z7984 Long term (current) use of oral hypoglycemic drugs: Secondary | ICD-10-CM | POA: Insufficient documentation

## 2020-12-14 DIAGNOSIS — I5022 Chronic systolic (congestive) heart failure: Secondary | ICD-10-CM | POA: Insufficient documentation

## 2020-12-14 DIAGNOSIS — R55 Syncope and collapse: Secondary | ICD-10-CM

## 2020-12-14 DIAGNOSIS — I110 Hypertensive heart disease with heart failure: Secondary | ICD-10-CM | POA: Insufficient documentation

## 2020-12-14 DIAGNOSIS — I10 Essential (primary) hypertension: Secondary | ICD-10-CM

## 2020-12-14 LAB — CBC
HCT: 46.4 % (ref 39.0–52.0)
Hemoglobin: 14.6 g/dL (ref 13.0–17.0)
MCH: 26.7 pg (ref 26.0–34.0)
MCHC: 31.5 g/dL (ref 30.0–36.0)
MCV: 84.8 fL (ref 80.0–100.0)
Platelets: 284 10*3/uL (ref 150–400)
RBC: 5.47 MIL/uL (ref 4.22–5.81)
RDW: 13.5 % (ref 11.5–15.5)
WBC: 8.5 10*3/uL (ref 4.0–10.5)
nRBC: 0 % (ref 0.0–0.2)

## 2020-12-14 LAB — COMPREHENSIVE METABOLIC PANEL
ALT: 32 U/L (ref 0–44)
AST: 29 U/L (ref 15–41)
Albumin: 4.2 g/dL (ref 3.5–5.0)
Alkaline Phosphatase: 58 U/L (ref 38–126)
Anion gap: 10 (ref 5–15)
BUN: 25 mg/dL — ABNORMAL HIGH (ref 8–23)
CO2: 24 mmol/L (ref 22–32)
Calcium: 9.7 mg/dL (ref 8.9–10.3)
Chloride: 104 mmol/L (ref 98–111)
Creatinine, Ser: 1.47 mg/dL — ABNORMAL HIGH (ref 0.61–1.24)
GFR, Estimated: 54 mL/min — ABNORMAL LOW (ref >60)
Glucose, Bld: 87 mg/dL (ref 70–99)
Potassium: 4.1 mmol/L (ref 3.5–5.1)
Sodium: 138 mmol/L (ref 135–145)
Total Bilirubin: 0.7 mg/dL (ref 0.3–1.2)
Total Protein: 7.7 g/dL (ref 6.5–8.1)

## 2020-12-14 MED ORDER — METOPROLOL SUCCINATE ER 25 MG PO TB24: 25.0000 mg | ORAL_TABLET | Freq: Every day | ORAL | 3 refills | Status: DC

## 2020-12-14 MED ORDER — LOSARTAN POTASSIUM 25 MG PO TABS: 12.5000 mg | ORAL_TABLET | Freq: Every day | ORAL | 3 refills | Status: DC

## 2020-12-14 NOTE — Progress Notes (Signed)
Orthostatic vitals Lying- bp 116/72 HR-68 Sitting-bp 108/72 HR 55 Standing-bp 102/72 HR 48

## 2020-12-14 NOTE — Progress Notes (Signed)
ReDS Vest / Clip - 12/14/20 1223       ReDS Vest / Clip   Station Marker C    Ruler Value 34    ReDS Value Range Low volume    ReDS Actual Value 28    Anatomical Comments sitting

## 2020-12-14 NOTE — Patient Instructions (Addendum)
STOP Entresto STOP Metoprolol  STOP Amiodarone RESTART Losartan 12.5 mg, one half tab daily at bedtime   Labs today We will only contact you if something comes back abnormal or we need to make some changes. Otherwise no news is good news!  Your provider has recommended that  you wear a Zio Patch for 14 days.  This monitor will record your heart rhythm for our review.  IF you have any symptoms while wearing the monitor please press the button.  If you have any issues with the patch or you notice a red or orange light on it please call the company at [phone].  Once you remove the patch please mail it back to the company as soon as possible so we can get the results.   Your physician recommends that you schedule a follow-up appointment in: 3 weeks  in the [facility], you and your health needs are our priority. As part of our continuing mission to provide you with exceptional heart care, we have created designated Provider Care Teams. These Care Teams include your primary Cardiologist (physician) and Advanced Practice Providers (APPs- Physician Assistants and Nurse Practitioners) who all work together to provide you with the care you need, when you need it.   You may see any of the following providers on your designated Care Team at your next follow up: Dr Glori Bickers Dr Loralie Champagne Dr Patrice Paradise, NP Lyda Jester, [place] Ginnie Smart Audry Riles, PharmD   Please be sure to bring in all your medications bottles to every appointment.   If you have any questions or concerns before your next appointment please send [place] a message through [place] or call our office at [phone].    TO LEAVE A MESSAGE FOR THE NURSE SELECT OPTION 2, PLEASE LEAVE A MESSAGE INCLUDING: YOUR NAME DATE OF BIRTH CALL BACK NUMBER REASON FOR CALL**this is important as we prioritize the call backs  YOU WILL  RECEIVE A CALL BACK THE SAME DAY AS LONG AS YOU CALL BEFORE 4:00 PM   ADDENDUM- ADDITIONAL CHANGES WERE MADE TO CARE PLAN AFTER PATIENTS VISIT. AVS UPDATED TO REFLECT CHANGES AND PATIENT AWARE VERBALLY.  NEW ORDERS ARE AS FOLLOWS:  CONTINUE Amiodarone 200 mg, one tab daily STOP Entresto CONTINUE Metoprolol 50 mg, one tab daily START Losartan 12.5 mg, one half tab daily at bedtime    Follow up with the Afib clinic asap Follow up  in the [facility] in 3 weeks

## 2020-12-14 NOTE — Progress Notes (Addendum)
Advanced Heart Failure Clinic Note    PCP: Gala Lewandowsky, MD HF Cardiologist: Dr. Haroldine Laws  HPI: [name] is a 60 y.o. male with history of possible right femoral artery versus cerebral angiogram in remote past (details not known), HTN, HLD, new diagnosis (as of 7/22) of atrial flutter and systolic heart failure.   He  presented to [facility] with chest pain. Echo with EF of 20-25%, mild AV sclerosis with no stenosis, mild to moderate MR, trace TR and no pericardial effusion (per chart review, echo report not available). He was diuresed with IV lasix. Also found to be in atrial flutter with RVR.    He was transferred to [facility] on 11/04/2020 for further management. R/LHC 11/04/20  with normal coronaries, normal LV filling pressures, normal right heart pressures, reduced CO, CI 2.26.  Initiated on GDMT with Entresto after ACEi washout, Toprol XL and spiro. Started anticoagulation with Eliquis and loaded with IV amiodarone.  Underwent successful TEE guided DCCV on 11/07/20. He was stable following the procedure. Had planned to discharge home but he was kept for monitoring due to significant dizziness. Entresto switched to losartan, and amio reduced to 200 mg bid. 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 He was evaluated by Neurology and Neurosurgery. No acute intervention felt to be warranted. Plan for outpatient follow up. Discharge weight 172 lbs.  GDMT titrated at follow up.  S/p AFL ablation 12/08/20.  Today he returns for HF follow up here with the interpretor. Feels his breathing is better, still SOB walking further distances on flat ground. Main issue is dizziness. Dizzy upon waking. He had a syncopal episode on Monday getting up going to bathroom. He did not hit his head, but has a large "bump" on right groin area that is painful. Eating and drinking her usual routine, says urine is clear/yellow.  Denies CP, edema, or PND/Orthopnea. No fever or chills. Weight at home 170 pounds.   Cardiac Studies: - Echo (7/22): EF of 20-25%, mild AV sclerosis with no stenosis, mild to moderate MR, trace TR and no pericardial effusion  - TEE/DCCV (11/07/20) successful restoration of SR. - R/LHC (11/04/20): normal coronaries, normal LV filling pressures, normal right heart pressures, reduced CO.  Fick Cardiac Output 4.22 L/min  Fick Cardiac Output Index 2.26 (L/min)/BSA  RA A Wave 10 mmHg  RA V Wave 10 mmHg  RA Mean 7 mmHg  RV Systolic Pressure 27 mmHg  RV Diastolic Pressure 3 mmHg  RV EDP 5 mmHg  PA Systolic Pressure 27 mmHg  PA Diastolic Pressure 13 mmHg  PA Mean 21 mmHg  PW A Wave 19 mmHg  PW V Wave 25 mmHg  PW Mean 19 mmHg  LV Systolic Pressure 123XX123 mmHg  LV Diastolic Pressure 6 mmHg  LV EDP 9 mmHg   Past Medical History:  Diagnosis Date   HTN (hypertension)    Current Outpatient Medications  Medication Sig Dispense Refill   amiodarone (PACERONE) 200 MG tablet Take 1 tablet (200 mg total) by mouth daily. 30 tablet 1   apixaban (ELIQUIS) 5 MG TABS tablet Take 1 tablet (5 mg total) by mouth 2 (two) times daily. 60 tablet 5   atorvastatin (LIPITOR) 40 MG tablet Take 1 tablet (40 mg total) by mouth daily at 6 PM. 90 tablet 3   FARXIGA 10 MG TABS tablet Take 10 mg by mouth daily.     hydroxypropyl methylcellulose / hypromellose (ISOPTO TEARS / GONIOVISC) 2.5 %  ophthalmic solution Place 1 drop into both eyes 3 (three) times daily as needed for dry eyes.     metoprolol succinate (TOPROL-XL) 50 MG 24 hr tablet Take 1 tablet (50 mg total) by mouth daily at 6 PM. Take with or immediately following a meal. 30 tablet 1   sacubitril-valsartan (ENTRESTO) 24-26 MG Take 1 tablet by mouth daily.     spironolactone (ALDACTONE) 25 MG tablet Take 0.5 tablets (12.5 mg total) by mouth at bedtime. 30 tablet 5   No current facility-administered medications for this encounter.   No Known Allergies  Social  History   Socioeconomic History   Marital status: Married    Spouse name: Not on file   Number of children: Not on file   Years of education: Not on file   Highest education level: Not on file  Occupational History   Not on file  Tobacco Use   Smoking status: Never   Smokeless tobacco: Never  Substance and Sexual Activity   Alcohol use: Never   Drug use: Never   Sexual activity: Not on file  Other Topics Concern   Not on file  Social History Narrative   Not on file   Social Determinants of Health   Financial Resource Strain: Medium Risk   Difficulty of Paying Living Expenses: Somewhat hard  Food Insecurity: Food Insecurity Present   Worried About Running Out of Food in the Last Year: Sometimes true   Ran Out of Food in the Last Year: Sometimes true  Transportation Needs: No Transportation Needs   Lack of Transportation (Medical): No   Lack of Transportation (Non-Medical): No  Physical Activity: Not on file  Stress: Not on file  Social Connections: Not on file  Intimate Partner Violence: Not on file   Family History  Problem Relation Age of Onset   Asthma Father    COPD Father    BP 112/78   Pulse 86   Wt 76.7 kg   SpO2 96%   BMI 27.28 kg/m   Wt Readings from Last 3 Encounters:  12/14/20 76.7 kg  12/08/20 77.1 kg  11/25/20 77.8 kg   PHYSICAL EXAM: General:  NAD. No resp difficulty, weak-appearing HEENT: Normal Neck: Supple. No JVD. Carotids 2+ bilat; no bruits. No lymphadenopathy or thryomegaly appreciated. Cor: PMI nondisplaced. Irregular rate & rhythm. No rubs, gallops or murmurs. Lungs: Clear Abdomen: Soft, nontender, nondistended. No hepatosplenomegaly. No bruits or masses. Good bowel sounds. Extremities: No cyanosis, clubbing, rash, edema Neuro: Alert & oriented x 3, cranial nerves grossly intact. Moves all 4 extremities w/o difficulty. Affect pleasant.  ECG: atrial flutter vs atrial tachycardia (personally discussed with Tommye Standard, PA & Afib  clinic colleagues; rate controlled).  ReDs: 28%  ASSESSMENT & PLAN: 1. Chronic Systolic Heart Failure - Echo on 11/07/2020: LVEF 20-25% with normal left and right atrial size, mild aortic valve sclerosis with no stenosis, mild to moderate mitral regurgitation, trace tricuspid regurgitation and no pericardial effusion.  - L/RHC (11/05/20): No CAD  Normal LVEDP. 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 - S/p TEE guided cardioversion 11/07/20 & s/p AFL ablation 12/08/20. - NYHA II- early III, volume good today. - With continued dizziness, stop Entresto and start losartan 12.5 mg q hs. - Continue Farxiga 10 mg daily. - Continue Toprol XL 50 mg daily.  - Continue Spiro 12.5 mg daily. - OK to liberalize salt and fluids today. - Labs today.  2. Atrial flutter - s/p  TEE/DCCV on 11/07/20.  - Moderate to severe LAE on TEE. - s/p AFL ablation 12/08/20. - Unfortunately he is back in atrial flutter today. - Will continue amiodarone 200 mg daily for now. - Continue Eliquis 5 mg bid. He has not missed any doses. - Continue Toprol XL 50 mg daily. - Will see if Afib clinic can see him soon for ? DCCV.  3 . Dizziness/syncope - Syncopal episode reported 10/31/20, and again this week. - ? 2/2 to oral amiodarone vs. orthostasis vs. pituitary adenoma - He is not profoundly orthostatic in clinic today, ? Accuracy of HR of 48. - Stop Entresto as above. - Place Zio AT 14 days. - Labs today.  4. HLD - Continue statin. - Needs FLP and AST/ALT next appt.   5. DM II - A1c 6.5%. - On Farxiga.   6. HTN - Stable. - Med changes as above.  7. Snoring - Sleep study ordered.   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 - Follow up with Neurosurgery. - F/u with ophthalmology for visual field evaluation - scheduled.  9. ? Inguinal Hernia - He noticed this after his recent syncopal episode. - Will send for U/S to confirm.  - No lifting. - Will need to  follow up with PCP regarding further management.       - Will see if EP can see him sooner to discuss alternative antiarrhythmics.  - Follow up with APP in 3 weeks. Will need to consider timing of repeat echo depending on ability to stay in rhythm.  Greater than 50% of the (total minutes 40) visit spent in counseling/coordination of care regarding ( details of discussion of rhythm management strategies, medication changes, coordination of care with Ep and Afib colleagues).  Shelly, FNP 12/14/20

## 2020-12-15 ENCOUNTER — Telehealth (HOSPITAL_COMMUNITY): Payer: Self-pay | Admitting: Cardiology

## 2020-12-15 ENCOUNTER — Telehealth (HOSPITAL_COMMUNITY): Payer: Self-pay | Admitting: Family Medicine

## 2020-12-15 NOTE — Telephone Encounter (Signed)
Attempted to contact patient to review changes (see addendum) LMOM   ADDENDUM- ADDITIONAL CHANGES WERE MADE TO CARE PLAN AFTER PATIENTS VISIT. AVS UPDATED TO REFLECT CHANGES AND PATIENT AWARE VERBALLY.  NEW ORDERS ARE AS FOLLOWS:  CONTINUE Amiodarone 200 mg, one tab daily STOP Entresto CONTINUE Metoprolol 50 mg, one tab daily START Losartan 12.5 mg, one half tab daily at bedtime   Follow up with the Afib clinic asap Follow up  in the [facility] in 3 weeks   Also [employer] called 12/14/20 @ 1652 as Juluis Rainier First report of aflutter found/noted on patients Zio 12/14/20 @ 1618 Avg HR @ 78 -pt did report mild dizziness and fatigue Detailed report available on website

## 2020-12-15 NOTE — Addendum Note (Signed)
Encounter addended by: Kerry Dory, CMA on: 12/15/2020 11:56 AM  Actions taken: Clinical Note Signed

## 2020-12-15 NOTE — Telephone Encounter (Signed)
Spoke with patient's daughter, Hunter Miller, about today's visit. Instructed her to tell patient to make no further medication changes except stopping Entresto and starting losartan. Discussed referral to Afib clinic for DCCV. She was agreeable with plan and appreciative of call.  Allena Katz, FNP-BC

## 2020-12-16 ENCOUNTER — Encounter (HOSPITAL_COMMUNITY): Payer: Self-pay

## 2020-12-16 ENCOUNTER — Ambulatory Visit (HOSPITAL_COMMUNITY)
Admission: RE | Admit: 2020-12-16 | Discharge: 2020-12-16 | Disposition: A | Discharge status: Home / Self Care | Payer: Medicaid Other | Source: Ambulatory Visit | Attending: Physician Assistant | Admitting: Physician Assistant

## 2020-12-16 ENCOUNTER — Other Ambulatory Visit: Payer: Self-pay

## 2020-12-16 ENCOUNTER — Other Ambulatory Visit (HOSPITAL_COMMUNITY): Payer: Medicaid Other

## 2020-12-16 VITALS — BP 104/70 | HR 62 | Ht 66.0 in | Wt 169.2 lb

## 2020-12-16 DIAGNOSIS — I4891 Unspecified atrial fibrillation: Secondary | ICD-10-CM | POA: Diagnosis not present

## 2020-12-16 DIAGNOSIS — I484 Atypical atrial flutter: Secondary | ICD-10-CM | POA: Diagnosis not present

## 2020-12-16 DIAGNOSIS — I5022 Chronic systolic (congestive) heart failure: Secondary | ICD-10-CM | POA: Insufficient documentation

## 2020-12-16 DIAGNOSIS — E118 Type 2 diabetes mellitus with unspecified complications: Secondary | ICD-10-CM | POA: Diagnosis not present

## 2020-12-16 DIAGNOSIS — Z7984 Long term (current) use of oral hypoglycemic drugs: Secondary | ICD-10-CM | POA: Insufficient documentation

## 2020-12-16 DIAGNOSIS — D6869 Other thrombophilia: Secondary | ICD-10-CM | POA: Diagnosis not present

## 2020-12-16 DIAGNOSIS — I110 Hypertensive heart disease with heart failure: Secondary | ICD-10-CM | POA: Insufficient documentation

## 2020-12-16 DIAGNOSIS — Z7901 Long term (current) use of anticoagulants: Secondary | ICD-10-CM | POA: Insufficient documentation

## 2020-12-16 DIAGNOSIS — Z79899 Other long term (current) drug therapy: Secondary | ICD-10-CM | POA: Insufficient documentation

## 2020-12-16 DIAGNOSIS — I483 Typical atrial flutter: Secondary | ICD-10-CM | POA: Diagnosis present

## 2020-12-16 DIAGNOSIS — I480 Paroxysmal atrial fibrillation: Secondary | ICD-10-CM | POA: Insufficient documentation

## 2020-12-16 NOTE — Progress Notes (Signed)
Primary Care Physician: Gala Lewandowsky, MD Primary Cardiologist: Dr Haroldine Laws  Primary Electrophysiologist: Dr Quentin Ore Referring Physician: Allena Katz NP   Hunter Miller is a 60 y.o. male with a history of HTN, DM, chronic systolic CHF, atrial flutter who presents for follow up in the [facility]. The patient was initially diagnosed with atrial flutter during a hospitalization 10/2020 for acute heart failure. He was started on amiodarone and underwent DCCV on 11/07/20. Patient is on Eliquis for a CHADS2VASC score of 3. He underwent atrial flutter ablation with Dr Quentin Ore on 12/08/20. He was also noted to have atypical atrial flutter at that time. Patient [facility] and was found to be back in atrial flutter. He does have symptoms of palpitations and dizziness. He states his dizziness has improved since stopping Entresto. He denies any bleeding issues on anticoagulation. He is currently wearing a Zio patch. His daughter served as an Astronomer today.   Today, he denies symptoms of chest pain, shortness of breath, orthopnea, PND, lower extremity edema, presyncope, syncope, snoring, daytime somnolence, bleeding, or neurologic sequela. The patient is tolerating medications without difficulties and is otherwise without complaint today.    Atrial Fibrillation Risk Factors:  he does not have symptoms or diagnosis of sleep apnea. he does not have a history of rheumatic fever.   he has a BMI of Body mass index is 27.31 kg/m.Marland Kitchen Filed Weights   12/16/20 1133  Weight: 76.7 kg    Family History  Problem Relation Age of Onset   Asthma Father    COPD Father      Atrial Fibrillation Management history:  Previous antiarrhythmic drugs: amiodarone  Previous cardioversions: 11/07/20 Previous ablations: flutter 12/08/20 CHADS2VASC score: 3 Anticoagulation history: Eliquis   Past Medical History:  Diagnosis Date   HTN (hypertension)    Past  Surgical History:  Procedure Laterality Date   A-FLUTTER ABLATION N/A 12/08/2020   Procedure: A-FLUTTER ABLATION;  Surgeon: Vickie Epley, MD;  Location: [place] [facility];  Service: Cardiovascular;  Laterality: N/A;   CARDIOVERSION N/A 11/07/2020   Procedure: CARDIOVERSION;  Surgeon: Jolaine Artist, MD;  Location: [facility] ENDOSCOPY;  Service: Cardiovascular;  Laterality: N/A;   RIGHT/LEFT HEART CATH AND CORONARY ANGIOGRAPHY N/A 11/04/2020   Procedure: RIGHT/LEFT HEART CATH AND CORONARY ANGIOGRAPHY;  Surgeon: Martinique, Peter M, MD;  Location: [place] [facility];  Service: Cardiovascular;  Laterality: N/A;   RIGHT/LEFT HEART CATH AND CORONARY/GRAFT ANGIOGRAPHY N/A 11/04/2020   Procedure: RIGHT/LEFT HEART CATH AND CORONARY/GRAFT ANGIOGRAPHY;  Surgeon: Martinique, Peter M, MD;  Location: [place] [facility];  Service: Cardiovascular;  Laterality: N/A;   TEE WITHOUT CARDIOVERSION N/A 11/07/2020   Procedure: TRANSESOPHAGEAL ECHOCARDIOGRAM (TEE);  Surgeon: Jolaine Artist, MD;  Location: [facility] ENDOSCOPY;  Service: Cardiovascular;  Laterality: N/A;    Current Outpatient Medications  Medication Sig Dispense Refill   amiodarone (PACERONE) 200 MG tablet Take 200 mg by mouth daily.     apixaban (ELIQUIS) 5 MG TABS tablet Take 1 tablet (5 mg total) by mouth 2 (two) times daily. 60 tablet 5   atorvastatin (LIPITOR) 40 MG tablet Take 1 tablet (40 mg total) by mouth daily at 6 PM. 90 tablet 3   FARXIGA 10 MG TABS tablet Take 10 mg by mouth daily.     hydroxypropyl methylcellulose / hypromellose (ISOPTO TEARS / GONIOVISC) 2.5 % ophthalmic solution Place 1 drop into both eyes 3 (three) times daily as needed for dry eyes.  losartan (COZAAR) 25 MG tablet Take 0.5 tablets (12.5 mg total) by mouth at bedtime. 15 tablet 3   meclizine (ANTIVERT) 25 MG tablet Take 25 mg by mouth 3 (three) times daily as needed.     metoprolol succinate (TOPROL-XL) 50 MG 24 hr tablet Take 50 mg by mouth daily.     mupirocin ointment  (BACTROBAN) 2 % SMARTSIG:1 Application Topical 2-3 Times Daily     spironolactone (ALDACTONE) 25 MG tablet Take 0.5 tablets (12.5 mg total) by mouth at bedtime. 30 tablet 5   No current facility-administered medications for this encounter.    No Known Allergies  Social History   Socioeconomic History   Marital status: Married    Spouse name: Not on file   Number of children: Not on file   Years of education: Not on file   Highest education level: Not on file  Occupational History   Not on file  Tobacco Use   Smoking status: Never   Smokeless tobacco: Never  Substance and Sexual Activity   Alcohol use: Never   Drug use: Never   Sexual activity: Not on file  Other Topics Concern   Not on file  Social History Narrative   Not on file   Social Determinants of Health   Financial Resource Strain: Medium Risk   Difficulty of Paying Living Expenses: Somewhat hard  Food Insecurity: Food Insecurity Present   Worried About Running Out of Food in the Last Year: Sometimes true   Ran Out of Food in the Last Year: Sometimes true  Transportation Needs: No Transportation Needs   Lack of Transportation (Medical): No   Lack of Transportation (Non-Medical): No  Physical Activity: Not on file  Stress: Not on file  Social Connections: Not on file  Intimate Partner Violence: Not on file     ROS- All systems are reviewed and negative except as per the HPI above.  Physical Exam: Vitals:   12/16/20 1133  BP: 104/70  Pulse: 62  Weight: 76.7 kg  Height: '5\' 6"'$  (1.676 m)    GEN- The patient is a well appearing male, alert and oriented x 3 today.   Head- normocephalic, atraumatic Eyes-  Sclera clear, conjunctiva pink Ears- hearing intact Oropharynx- clear Neck- supple  Lungs- Clear to ausculation bilaterally, normal work of breathing Heart- irregular rate and rhythm, no murmurs, rubs or gallops  GI- soft, NT, ND, + BS Extremities- no clubbing, cyanosis, or edema MS- no significant  deformity or atrophy Skin- no rash or lesion Psych- euthymic mood, full affect Neuro- strength and sensation are intact  Wt Readings from Last 3 Encounters:  12/16/20 76.7 kg  12/14/20 76.7 kg  12/08/20 77.1 kg    EKG today demonstrates  Atypical atrial flutter Vent. rate 62 BPM PR interval * ms QRS duration 132 ms QT/QTcB 416/422 ms  TEE 11/07/20 demonstrated   1. Left ventricular ejection fraction, by estimation, is 20 to 25%. The left ventricle has severely decreased function. The left ventricle demonstrates global hypokinesis.   2. Right ventricular systolic function is moderately reduced. The right ventricular size is normal.   3. Left atrial size was severely dilated. No left atrial/left atrial  appendage thrombus was detected.   4. The mitral valve is normal in structure. Mild to moderate mitral valve regurgitation.   5. The aortic valve is tricuspid. Aortic valve regurgitation is not  visualized.   Epic records are reviewed at length today  CHA2DS2-VASc Score = 3  The patient's score is  based upon: CHF History: Yes HTN History: Yes Diabetes History: Yes Stroke History: No Vascular Disease History: No Age Score: 0 Gender Score: 0     ASSESSMENT AND PLAN: 1. Atrial flutter, typical and atypical  The patient's CHA2DS2-VASc score is 3, indicating a 3.2% annual risk of stroke.   Patient remains in rate controlled atrial flutter. We discussed therapeutic options today. Will plan for DCCV. Recent labs reviewed.  Continue amiodarone 200 mg daily for now. With atypical atrial flutter, he may require long term AAD. Could consider dofetilide after 3 month washout of amio. Would likely avoid class 1C and Multaq with CHF. Continue metoprolol 50 mg daily Continue Eliquis 5 mg BID with no missed doses for 3 months post ablation.   2. Secondary Hypercoagulable State (ICD10:  D68.69) The patient is at significant risk for stroke/thromboembolism based upon his CHA2DS2-VASc  Score of 3.  Continue Apixaban (Eliquis).   3. Chronic systolic CHF Suspected tachycardia mediated. No signs or symptoms of fluid overload today.  4. HTN Stable, no changes today.   Follow up with [facility] and Dr Quentin Ore as scheduled.    [facility] [address] [place], [place] [address] [phone] 12/16/2020 12:21 PM

## 2020-12-16 NOTE — Telephone Encounter (Signed)
Patient/caregiver spoke with Darl Householder, NP directly regarding medication changes. See phone note dated 8/25

## 2020-12-16 NOTE — H&P (View-Only) (Signed)
Primary Care Physician: Gala Lewandowsky, MD Primary Cardiologist: Dr Haroldine Laws  Primary Electrophysiologist: Dr Quentin Ore Referring Physician: Allena Katz NP   Hunter Miller is a 60 y.o. male with a history of HTN, DM, chronic systolic CHF, atrial flutter who presents for follow up in the [facility]. The patient was initially diagnosed with atrial flutter during a hospitalization 10/2020 for acute heart failure. He was started on amiodarone and underwent DCCV on 11/07/20. Patient is on Eliquis for a CHADS2VASC score of 3. He underwent atrial flutter ablation with Dr Quentin Ore on 12/08/20. He was also noted to have atypical atrial flutter at that time. Patient was seen in the [facility] 12/14/20 and was found to be back in atrial flutter. He does have symptoms of palpitations and dizziness. He states his dizziness has improved since stopping Entresto. He denies any bleeding issues on anticoagulation. He is currently wearing a Zio patch. His daughter served as an Astronomer today.   Today, he denies symptoms of chest pain, shortness of breath, orthopnea, PND, lower extremity edema, presyncope, syncope, snoring, daytime somnolence, bleeding, or neurologic sequela. The patient is tolerating medications without difficulties and is otherwise without complaint today.    Atrial Fibrillation Risk Factors:  he does not have symptoms or diagnosis of sleep apnea. he does not have a history of rheumatic fever.   he has a BMI of Body mass index is 27.31 kg/m.Marland Kitchen Filed Weights   12/16/20 1133  Weight: 76.7 kg    Family History  Problem Relation Age of Onset   Asthma Father    COPD Father      Atrial Fibrillation Management history:  Previous antiarrhythmic drugs: amiodarone  Previous cardioversions: 11/07/20 Previous ablations: flutter 12/08/20 CHADS2VASC score: 3 Anticoagulation history: Eliquis   Past Medical History:  Diagnosis Date   HTN (hypertension)    Past  Surgical History:  Procedure Laterality Date   A-FLUTTER ABLATION N/A 12/08/2020   Procedure: A-FLUTTER ABLATION;  Surgeon: Vickie Epley, MD;  Location: [place] CV LAB;  Service: Cardiovascular;  Laterality: N/A;   CARDIOVERSION N/A 11/07/2020   Procedure: CARDIOVERSION;  Surgeon: Jolaine Artist, MD;  Location: [facility] ENDOSCOPY;  Service: Cardiovascular;  Laterality: N/A;   RIGHT/LEFT HEART CATH AND CORONARY ANGIOGRAPHY N/A 11/04/2020   Procedure: RIGHT/LEFT HEART CATH AND CORONARY ANGIOGRAPHY;  Surgeon: Martinique, Peter M, MD;  Location: [place] CV LAB;  Service: Cardiovascular;  Laterality: N/A;   RIGHT/LEFT HEART CATH AND CORONARY/GRAFT ANGIOGRAPHY N/A 11/04/2020   Procedure: RIGHT/LEFT HEART CATH AND CORONARY/GRAFT ANGIOGRAPHY;  Surgeon: Martinique, Peter M, MD;  Location: [place] CV LAB;  Service: Cardiovascular;  Laterality: N/A;   TEE WITHOUT CARDIOVERSION N/A 11/07/2020   Procedure: TRANSESOPHAGEAL ECHOCARDIOGRAM (TEE);  Surgeon: Jolaine Artist, MD;  Location: [facility] ENDOSCOPY;  Service: Cardiovascular;  Laterality: N/A;    Current Outpatient Medications  Medication Sig Dispense Refill   amiodarone (PACERONE) 200 MG tablet Take 200 mg by mouth daily.     apixaban (ELIQUIS) 5 MG TABS tablet Take 1 tablet (5 mg total) by mouth 2 (two) times daily. 60 tablet 5   atorvastatin (LIPITOR) 40 MG tablet Take 1 tablet (40 mg total) by mouth daily at 6 PM. 90 tablet 3   FARXIGA 10 MG TABS tablet Take 10 mg by mouth daily.     hydroxypropyl methylcellulose / hypromellose (ISOPTO TEARS / GONIOVISC) 2.5 % ophthalmic solution Place 1 drop into both eyes 3 (three) times daily as needed for dry eyes.  losartan (COZAAR) 25 MG tablet Take 0.5 tablets (12.5 mg total) by mouth at bedtime. 15 tablet 3   meclizine (ANTIVERT) 25 MG tablet Take 25 mg by mouth 3 (three) times daily as needed.     metoprolol succinate (TOPROL-XL) 50 MG 24 hr tablet Take 50 mg by mouth daily.     mupirocin ointment  (BACTROBAN) 2 % SMARTSIG:1 Application Topical 2-3 Times Daily     spironolactone (ALDACTONE) 25 MG tablet Take 0.5 tablets (12.5 mg total) by mouth at bedtime. 30 tablet 5   No current facility-administered medications for this encounter.    No Known Allergies  Social History   Socioeconomic History   Marital status: Married    Spouse name: Not on file   Number of children: Not on file   Years of education: Not on file   Highest education level: Not on file  Occupational History   Not on file  Tobacco Use   Smoking status: Never   Smokeless tobacco: Never  Substance and Sexual Activity   Alcohol use: Never   Drug use: Never   Sexual activity: Not on file  Other Topics Concern   Not on file  Social History Narrative   Not on file   Social Determinants of Health   Financial Resource Strain: Medium Risk   Difficulty of Paying Living Expenses: Somewhat hard  Food Insecurity: Food Insecurity Present   Worried About Running Out of Food in the Last Year: Sometimes true   Ran Out of Food in the Last Year: Sometimes true  Transportation Needs: No Transportation Needs   Lack of Transportation (Medical): No   Lack of Transportation (Non-Medical): No  Physical Activity: Not on file  Stress: Not on file  Social Connections: Not on file  Intimate Partner Violence: Not on file     ROS- All systems are reviewed and negative except as per the HPI above.  Physical Exam: Vitals:   12/16/20 1133  BP: 104/70  Pulse: 62  Weight: 76.7 kg  Height: '5\' 6"'$  (1.676 m)    GEN- The patient is a well appearing male, alert and oriented x 3 today.   Head- normocephalic, atraumatic Eyes-  Sclera clear, conjunctiva pink Ears- hearing intact Oropharynx- clear Neck- supple  Lungs- Clear to ausculation bilaterally, normal work of breathing Heart- irregular rate and rhythm, no murmurs, rubs or gallops  GI- soft, NT, ND, + BS Extremities- no clubbing, cyanosis, or edema MS- no significant  deformity or atrophy Skin- no rash or lesion Psych- euthymic mood, full affect Neuro- strength and sensation are intact  Wt Readings from Last 3 Encounters:  12/16/20 76.7 kg  12/14/20 76.7 kg  12/08/20 77.1 kg    EKG today demonstrates  Atypical atrial flutter Vent. rate 62 BPM PR interval * ms QRS duration 132 ms QT/QTcB 416/422 ms  TEE 11/07/20 demonstrated   1. Left ventricular ejection fraction, by estimation, is 20 to 25%. The left ventricle has severely decreased function. The left ventricle demonstrates global hypokinesis.   2. Right ventricular systolic function is moderately reduced. The right ventricular size is normal.   3. Left atrial size was severely dilated. No left atrial/left atrial  appendage thrombus was detected.   4. The mitral valve is normal in structure. Mild to moderate mitral valve regurgitation.   5. The aortic valve is tricuspid. Aortic valve regurgitation is not  visualized.   Epic records are reviewed at length today  CHA2DS2-VASc Score = 3  The patient's score is  based upon: CHF History: Yes HTN History: Yes Diabetes History: Yes Stroke History: No Vascular Disease History: No Age Score: 0 Gender Score: 0     ASSESSMENT AND PLAN: 1. Atrial flutter, typical and atypical  The patient's CHA2DS2-VASc score is 3, indicating a 3.2% annual risk of stroke.   Patient remains in rate controlled atrial flutter. We discussed therapeutic options today. Will plan for DCCV. Recent labs reviewed.  Continue amiodarone 200 mg daily for now. With atypical atrial flutter, he may require long term AAD. Could consider dofetilide after 3 month washout of amio. Would likely avoid class 1C and Multaq with CHF. Continue metoprolol 50 mg daily Continue Eliquis 5 mg BID with no missed doses for 3 months post ablation.   2. Secondary Hypercoagulable State (ICD10:  D68.69) The patient is at significant risk for stroke/thromboembolism based upon his CHA2DS2-VASc  Score of 3.  Continue Apixaban (Eliquis).   3. Chronic systolic CHF Suspected tachycardia mediated. No signs or symptoms of fluid overload today.  4. HTN Stable, no changes today.   Follow up with [facility] and Dr Quentin Ore as scheduled.    [facility] [address] [place], [place] [address] [phone] 12/16/2020 12:21 PM

## 2020-12-16 NOTE — Patient Instructions (Signed)
Cardioversion scheduled for Wednesday, August 31st  - Arrive at the [facility] and go to admitting at 1130AM  - Do not eat or drink anything after midnight the night prior to your procedure.  - Take all your morning medication (except diabetic medications) with a sip of water prior to arrival.  - You will not be able to drive home after your procedure.  - Do NOT miss any doses of your blood thinner - if you should miss a dose please notify our office immediately.  - If you feel as if you go back into normal rhythm prior to scheduled cardioversion, please notify our office immediately. If your procedure is canceled in the cardioversion suite you will be charged a cancellation fee. Patients will be asked to: to mask in public and hand hygiene (no longer quarantine) in the 3 days prior to surgery, to report if any COVID-19-like illness or household contacts to COVID-19 to determine need for testing

## 2020-12-19 ENCOUNTER — Encounter (HOSPITAL_COMMUNITY): Payer: Medicaid Other

## 2020-12-21 ENCOUNTER — Ambulatory Visit (HOSPITAL_COMMUNITY): Payer: Medicaid Other | Admitting: Anesthesiology

## 2020-12-21 ENCOUNTER — Ambulatory Visit (HOSPITAL_COMMUNITY)
Admission: RE | Admit: 2020-12-21 | Discharge: 2020-12-21 | Disposition: A | Discharge status: Home / Self Care | Payer: Medicaid Other | Attending: Cardiology | Admitting: Cardiology

## 2020-12-21 ENCOUNTER — Encounter (HOSPITAL_COMMUNITY)
Admission: RE | Disposition: A | Discharge status: Home / Self Care | Payer: Self-pay | Source: Home / Self Care | Attending: Cardiology

## 2020-12-21 ENCOUNTER — Other Ambulatory Visit: Payer: Self-pay

## 2020-12-21 ENCOUNTER — Encounter (HOSPITAL_COMMUNITY): Payer: Self-pay | Admitting: Cardiology

## 2020-12-21 DIAGNOSIS — Z7984 Long term (current) use of oral hypoglycemic drugs: Secondary | ICD-10-CM | POA: Insufficient documentation

## 2020-12-21 DIAGNOSIS — E119 Type 2 diabetes mellitus without complications: Secondary | ICD-10-CM | POA: Diagnosis not present

## 2020-12-21 DIAGNOSIS — Z79899 Other long term (current) drug therapy: Secondary | ICD-10-CM | POA: Diagnosis not present

## 2020-12-21 DIAGNOSIS — I5022 Chronic systolic (congestive) heart failure: Secondary | ICD-10-CM | POA: Diagnosis not present

## 2020-12-21 DIAGNOSIS — I484 Atypical atrial flutter: Secondary | ICD-10-CM | POA: Insufficient documentation

## 2020-12-21 DIAGNOSIS — I4819 Other persistent atrial fibrillation: Secondary | ICD-10-CM | POA: Diagnosis not present

## 2020-12-21 DIAGNOSIS — I110 Hypertensive heart disease with heart failure: Secondary | ICD-10-CM | POA: Diagnosis not present

## 2020-12-21 DIAGNOSIS — Z7901 Long term (current) use of anticoagulants: Secondary | ICD-10-CM | POA: Diagnosis not present

## 2020-12-21 DIAGNOSIS — I483 Typical atrial flutter: Secondary | ICD-10-CM | POA: Insufficient documentation

## 2020-12-21 DIAGNOSIS — Z825 Family history of asthma and other chronic lower respiratory diseases: Secondary | ICD-10-CM | POA: Insufficient documentation

## 2020-12-21 HISTORY — PX: CARDIOVERSION: SHX1299

## 2020-12-21 SURGERY — CARDIOVERSION
Anesthesia: General

## 2020-12-21 MED ORDER — SODIUM CHLORIDE 0.9 % IV SOLN: INTRAVENOUS | Status: DC | PRN

## 2020-12-21 MED ORDER — PROPOFOL 10 MG/ML IV BOLUS
INTRAVENOUS | Status: DC | PRN
  Administered 2020-12-21: 70 mg via INTRAVENOUS

## 2020-12-21 MED ORDER — LIDOCAINE HCL (CARDIAC) PF 100 MG/5ML IV SOSY
PREFILLED_SYRINGE | INTRAVENOUS | Status: DC | PRN
  Administered 2020-12-21: 60 mg via INTRAVENOUS

## 2020-12-21 NOTE — Anesthesia Procedure Notes (Signed)
Procedure Name: General with mask airway Date/Time: 12/21/2020 12:30 PM Performed by: Dorthea Cove, CRNA Pre-anesthesia Checklist: Timeout performed, Patient being monitored, Suction available, Emergency Drugs available and Patient identified Patient Re-evaluated:Patient Re-evaluated prior to induction Oxygen Delivery Method: Ambu bag Preoxygenation: Pre-oxygenation with 100% oxygen Induction Type: IV induction Ventilation: Mask ventilation without difficulty Placement Confirmation: positive ETCO2 and CO2 detector Dental Injury: Teeth and Oropharynx as per pre-operative assessment

## 2020-12-21 NOTE — Anesthesia Preprocedure Evaluation (Addendum)
Anesthesia Evaluation  Patient identified by MRN, date of birth, ID band Patient awake    Reviewed: Allergy & Precautions, NPO status , Patient's Chart, lab work & pertinent test results  Airway Mallampati: II  TM Distance: >3 FB Neck ROM: Full    Dental  (+) Edentulous Upper, Edentulous Lower   Pulmonary neg pulmonary ROS,    Pulmonary exam normal        Cardiovascular hypertension, Pt. on medications and Pt. on home beta blockers +CHF  + dysrhythmias Atrial Fibrillation  Rhythm:Irregular Rate:Normal     Neuro/Psych  Headaches, negative psych ROS   GI/Hepatic negative GI ROS, Neg liver ROS,   Endo/Other  negative endocrine ROS  Renal/GU negative Renal ROS  negative genitourinary   Musculoskeletal negative musculoskeletal ROS (+)   Abdominal (+)  Abdomen: soft. Bowel sounds: normal.  Peds  Hematology negative hematology ROS (+)   Anesthesia Other Findings   Reproductive/Obstetrics                            Anesthesia Physical Anesthesia Plan  ASA: 4  Anesthesia Plan: General   Post-op Pain Management:    Induction: Intravenous  PONV Risk Score and Plan: 2 and Propofol infusion and Treatment may vary due to age or medical condition  Airway Management Planned: Mask  Additional Equipment: None  Intra-op Plan:   Post-operative Plan:   Informed Consent: I have reviewed the patients History and Physical, chart, labs and discussed the procedure including the risks, benefits and alternatives for the proposed anesthesia with the patient or authorized representative who has indicated his/her understanding and acceptance.     Dental advisory given  Plan Discussed with: CRNA  Anesthesia Plan Comments: (Lab Results      Component                Value               Date                      WBC                      8.5                 12/14/2020                HGB                       14.6                12/14/2020                HCT                      46.4                12/14/2020                MCV                      84.8                12/14/2020                PLT  284                 12/14/2020           Lab Results      Component                Value               Date                      NA                       138                 12/14/2020                K                        4.1                 12/14/2020                CO2                      24                  12/14/2020                GLUCOSE                  87                  12/14/2020                BUN                      25 (H)              12/14/2020                CREATININE               1.47 (H)            12/14/2020                CALCIUM                  9.7                 12/14/2020                GFRNONAA                 54 (L)              12/14/2020            ECHO 07/22: 1. Left ventricular ejection fraction, by estimation, is 20 to 25%. The  left ventricle has severely decreased function. The left ventricle  demonstrates global hypokinesis.  2. Right ventricular systolic function is moderately reduced. The right  ventricular size is normal.  3. Left atrial size was severely dilated. No left atrial/left atrial  appendage thrombus was detected.  4. The mitral valve is normal in structure. Mild to moderate mitral valve  regurgitation.  5. The aortic valve is tricuspid. Aortic valve regurgitation is not  visualized. )  Anesthesia Quick Evaluation  

## 2020-12-21 NOTE — Interval H&P Note (Signed)
History and Physical Interval Note:  12/21/2020 12:11 PM  Hunter Miller  has presented today for surgery, with the diagnosis of AFIB.  The various methods of treatment have been discussed with the patient and family. After consideration of risks, benefits and other options for treatment, the patient has consented to  Procedure(s): CARDIOVERSION (N/A) as a surgical intervention.  The patient's history has been reviewed, patient examined, no change in status, stable for surgery.  I have reviewed the patient's chart and labs.  Questions were answered to the patient's satisfaction.     Fransico Him

## 2020-12-21 NOTE — Anesthesia Postprocedure Evaluation (Signed)
Anesthesia Post Note  Patient: Hunter Miller  Procedure(s) Performed: CARDIOVERSION     Patient location during evaluation: Endoscopy Anesthesia Type: General Level of consciousness: awake and alert Pain management: pain level controlled Vital Signs Assessment: post-procedure vital signs reviewed and stable Respiratory status: spontaneous breathing, nonlabored ventilation, respiratory function stable and patient connected to nasal cannula oxygen Cardiovascular status: blood pressure returned to baseline and stable Postop Assessment: no apparent nausea or vomiting Anesthetic complications: no   No notable events documented.  Last Vitals:  Vitals:   12/21/20 1250 12/21/20 1302  BP: 108/80 116/84  Pulse: 63 64  Resp: 17 13  Temp:    SpO2: 96% 96%    Last Pain:  Vitals:   12/21/20 1302  TempSrc:   PainSc: 0-No pain                 [name] P Saurabh Hettich

## 2020-12-21 NOTE — Transfer of Care (Signed)
Immediate Anesthesia Transfer of Care Note  Patient: Hunter Miller  Procedure(s) Performed: CARDIOVERSION  Patient Location: Endoscopy Unit  Anesthesia Type:General  Level of Consciousness: awake, drowsy and patient cooperative  Airway & Oxygen Therapy: Patient Spontanous Breathing  Post-op Assessment: Report given to RN and Post -op Vital signs reviewed and stable  Post vital signs: Reviewed and stable  Last Vitals:  Vitals Value Taken Time  BP 110/79   Temp    Pulse 67   Resp 12   SpO2 98     Last Pain:  Vitals:   12/21/20 1203  TempSrc: Temporal  PainSc: 0-No pain         Complications: No notable events documented.

## 2020-12-21 NOTE — CV Procedure (Signed)
   Electrical Cardioversion Procedure Note Hunter Miller QE:[id number] Apr 02, 1961  Procedure: Electrical Cardioversion Indications:  Atrial Flutter  Time Out: Verified patient identification, verified procedure,medications/allergies/relevent history reviewed, required imaging and test results available.  Performed  Procedure Details  The patient was NPO after midnight. Anesthesia was administered at the beside  by Dr.Stolzfus with '70mg'$  of propofol and '60mg'$  Lidocaine.  Cardioversion was done with synchronized biphasic defibrillation with AP pads with 150watts.  The patient converted to normal sinus rhythm. The patient tolerated the procedure well   IMPRESSION:  Successful cardioversion of atrial flutter    Hunter Miller 12/21/2020, 12:14 PM

## 2020-12-22 ENCOUNTER — Encounter (HOSPITAL_COMMUNITY): Payer: Self-pay | Admitting: Cardiology

## 2021-01-01 NOTE — Progress Notes (Addendum)
Advanced Heart Failure Clinic Note    PCP: Gala Lewandowsky, MD HF Cardiologist: Dr. Haroldine Laws  HPI: Hunter Miller is a 60 y.o. male with history of possible right femoral artery versus cerebral angiogram in remote past (details not known), HTN, HLD, new diagnosis (as of 7/22) of atrial flutter and systolic heart failure.   Presented to [facility] with chest pain. Echo with EF of 20-25%, mild AV sclerosis with no stenosis, mild to moderate MR, trace TR and no pericardial effusion (per chart review, echo report not available). He was diuresed with IV lasix. Also found to be in atrial flutter with RVR.    He was transferred to [facility] on 11/04/2020 for further management. R/LHC 11/04/20  with normal corss, normal filling pressures, normal right heart pressures, reduced CO, CI 2.26.  Initiated on GDMT with Entresto after ACEi washout, Toprol XL and spiro. Started anticoagulation with Eliquis and loaded with IV amiodarone.  Underwent successful TEE guided DCCV on 11/07/20. He was stable following the procedure. Had planned to discharge home but he was kept for monitoring due to significant dizziness. Entresto switched to losartan, and amio reduced to 200 mg bid. 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 He was evaluated by Neurology and Neurosurgery. No acute intervention felt to be warranted. Plan for outpatient follow up. Discharge weight 172 lbs.  GDMT titrated at follow up.  S/p AFL ablation 12/08/20.  Back in AFL at HF follow up. Still dizzy with syncopal episode, Zio placed and losartan started in place of Entresto. Referred to [facility]ib Clinic.  s/p DCCV 12/21/20.  Today he returns for HF follow up with interpretor. Dizziness completely resolved, no further syncope. SOB after walking longer distances. No SOB with hills. Denies CP, edema, or PND/Orthopnea. Appetite ok. No fever or chills. Weight at home 170 pounds.  Taking all medications. Denies palpitations.   Cardiac Studies: - s/p DCCV 12/21/20 to SR.  - s/p AFL ablation 12/08/20  - Echo (7/22): EF of 20-25%, mild AV sclerosis with no stenosis, mild to moderate MR, trace TR and no pericardial effusion   - TEE/DCCV (11/07/20) successful restoration of SR.  - R/LHC (11/04/20): normal coronaries, normal LV filling pressures, normal right heart pressures, reduced CO.  Fick Cardiac Output 4.22 L/min  Fick Cardiac Output Index 2.26 (L/min)/BSA  RA A Wave 10 mmHg  RA V Wave 10 mmHg  RA Mean 7 mmHg  RV Systolic Pressure 27 mmHg  RV Diastolic Pressure 3 mmHg  RV EDP 5 mmHg  PA Systolic Pressure 27 mmHg  PA Diastolic Pressure 13 mmHg  PA Mean 21 mmHg  PW A Wave 19 mmHg  PW V Wave 25 mmHg  PW Mean 19 mmHg  LV Systolic Pressure 123XX123 mmHg  LV Diastolic Pressure 6 mmHg  LV EDP 9 mmHg   Past Medical History:  Diagnosis Date   HTN (hypertension)    Current Outpatient Medications  Medication Sig Dispense Refill   amiodarone (PACERONE) 200 MG tablet Take 200 mg by mouth daily.     apixaban (ELIQUIS) 5 MG TABS tablet Take 1 tablet (5 mg total) by mouth 2 (two) times daily. 60 tablet 5   atorvastatin (LIPITOR) 40 MG tablet Take 1 tablet (40 mg total) by mouth daily at 6 PM. 90 tablet 3   FARXIGA 10 MG TABS tablet Take 10 mg by mouth daily.     hydroxypropyl methylcellulose / hypromellose (ISOPTO TEARS / GONIOVISC) 2.5 % ophthalmic  solution Place 1 drop into both eyes 3 (three) times daily as needed for dry eyes.     losartan (COZAAR) 25 MG tablet Take 0.5 tablets (12.5 mg total) by mouth at bedtime. 15 tablet 3   meclizine (ANTIVERT) 25 MG tablet Take 25 mg by mouth 3 (three) times daily as needed.     metoprolol succinate (TOPROL-XL) 50 MG 24 hr tablet Take 50 mg by mouth daily.     mupirocin ointment (BACTROBAN) 2 % SMARTSIG:1 Application Topical 2-3 Times Daily     spironolactone (ALDACTONE) 25 MG tablet Take 0.5 tablets (12.5 mg total) by mouth at  bedtime. 30 tablet 5   No current facility-administered medications for this encounter.   No Known Allergies  Social History   Socioeconomic History   Marital status: Married    Spouse name: Not on file   Number of children: Not on file   Years of education: Not on file   Highest education level: Not on file  Occupational History   Not on file  Tobacco Use   Smoking status: Never   Smokeless tobacco: Never  Substance and Sexual Activity   Alcohol use: Never   Drug use: Never   Sexual activity: Not on file  Other Topics Concern   Not on file  Social History Narrative   Not on file   Social Determinants of Health   Financial Resource Strain: Medium Risk   Difficulty of Paying Living Expenses: Somewhat hard  Food Insecurity: Food Insecurity Present   Worried About Running Out of Food in the Last Year: Sometimes true   Ran Out of Food in the Last Year: Sometimes true  Transportation Needs: No Transportation Needs   Lack of Transportation (Medical): No   Lack of Transportation (Non-Medical): No  Physical Activity: Not on file  Stress: Not on file  Social Connections: Not on file  Intimate Partner Violence: Not on file   Family History  Problem Relation Age of Onset   Asthma Father    COPD Father    BP 120/70   Pulse 64   Wt 77.5 kg (170 lb 12.8 oz)   SpO2 96%   BMI 27.57 kg/m   Wt Readings from Last 3 Encounters:  01/02/21 77.5 kg (170 lb 12.8 oz)  12/16/20 76.7 kg (169 lb 3.2 oz)  12/14/20 76.7 kg (169 lb)   PHYSICAL EXAM: General:  NAD. No resp difficulty HEENT: Normal Neck: Supple. No JVD. Carotids 2+ bilat; no bruits. No lymphadenopathy or thryomegaly appreciated. Cor: PMI nondisplaced. Irregular rate & rhythm. No rubs, gallops or murmurs. Lungs: Clear Abdomen: Soft, nontender, nondistended. No hepatosplenomegaly. No bruits or masses. Good bowel sounds. Extremities: No cyanosis, clubbing, rash, edema Neuro: Alert & oriented x 3, cranial nerves  grossly intact. Moves all 4 extremities w/o difficulty. Affect pleasant.  ECG: atypical atrial flutter (personally discussed ECG tracing with Adline Peals, PA & Dr. Aundra Dubin; rate controlled).  ASSESSMENT & PLAN: 1. Chronic Systolic Heart Failure - Echo on 11/07/2020: LVEF 20-25% with normal left and right atrial size, mild aortic valve sclerosis with no stenosis, mild to moderate mitral regurgitation, trace tricuspid regurgitation and no pericardial effusion.  - L/RHC (11/05/20): No CAD  Normal LVEDP. 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 - S/p TEE guided cardioversion 11/07/20 & s/p AFL ablation 12/08/20, DCCV 12/21/20. - NYHA II, volume good today. - Continue losartan 12.5 mg q hs. (Did not tolerate Entreso due to dizziness). -  Continue Farxiga 10 mg daily. - Continue Toprol XL 50 mg daily.  - Continue Spiro 12.5 mg daily. - Labs today.  2. Atrial flutter - s/p TEE/DCCV on 11/07/20 & 12/21/20. - Moderate to severe LAE on TEE. - s/p AFL ablation 12/08/20. - Unfortunately he is back in AFL today on ECG, rate controlled. - CHA2DS2-VASc score is 3 - He is rate-controlled and asymptomatic, will continue amiodarone 200 mg daily for now. - Continue Eliquis 5 mg bid. - Continue Toprol XL 50 mg daily. - Will defer management to EP, he has follow up next week.  3 . Dizziness/syncope - Resolved after stopping Entresto. - Zio AT results pending.  4. HLD - Continue statin. - FLP and AST/ALT today.   5. DM II - A1c 6.5%. - On Farxiga.   6. HTN - Stable. - No changes.  7. Snoring - Sleep study ordered.   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 - Follow up with Neurosurgery. - F/u with ophthalmology for visual field evaluation - scheduled.  - Follow up with Dr. Haroldine Laws w/ echo as scheduled.  [place], FNP 01/02/21

## 2021-01-02 ENCOUNTER — Encounter (HOSPITAL_COMMUNITY): Payer: Self-pay

## 2021-01-02 ENCOUNTER — Other Ambulatory Visit: Payer: Self-pay

## 2021-01-02 ENCOUNTER — Ambulatory Visit (HOSPITAL_COMMUNITY)
Admission: RE | Admit: 2021-01-02 | Discharge: 2021-01-02 | Disposition: A | Discharge status: Home / Self Care | Payer: Medicaid Other | Source: Ambulatory Visit | Attending: Family Medicine | Admitting: Family Medicine

## 2021-01-02 VITALS — BP 120/70 | HR 64 | Wt 170.8 lb

## 2021-01-02 DIAGNOSIS — E785 Hyperlipidemia, unspecified: Secondary | ICD-10-CM

## 2021-01-02 DIAGNOSIS — I484 Atypical atrial flutter: Secondary | ICD-10-CM | POA: Diagnosis not present

## 2021-01-02 DIAGNOSIS — Z7984 Long term (current) use of oral hypoglycemic drugs: Secondary | ICD-10-CM | POA: Insufficient documentation

## 2021-01-02 DIAGNOSIS — R42 Dizziness and giddiness: Secondary | ICD-10-CM | POA: Insufficient documentation

## 2021-01-02 DIAGNOSIS — I110 Hypertensive heart disease with heart failure: Secondary | ICD-10-CM | POA: Insufficient documentation

## 2021-01-02 DIAGNOSIS — I4892 Unspecified atrial flutter: Secondary | ICD-10-CM | POA: Insufficient documentation

## 2021-01-02 DIAGNOSIS — R55 Syncope and collapse: Secondary | ICD-10-CM

## 2021-01-02 DIAGNOSIS — I10 Essential (primary) hypertension: Secondary | ICD-10-CM

## 2021-01-02 DIAGNOSIS — I447 Left bundle-branch block, unspecified: Secondary | ICD-10-CM | POA: Insufficient documentation

## 2021-01-02 DIAGNOSIS — E119 Type 2 diabetes mellitus without complications: Secondary | ICD-10-CM | POA: Diagnosis not present

## 2021-01-02 DIAGNOSIS — D352 Benign neoplasm of pituitary gland: Secondary | ICD-10-CM | POA: Diagnosis not present

## 2021-01-02 DIAGNOSIS — Z79899 Other long term (current) drug therapy: Secondary | ICD-10-CM | POA: Insufficient documentation

## 2021-01-02 DIAGNOSIS — Z7901 Long term (current) use of anticoagulants: Secondary | ICD-10-CM | POA: Insufficient documentation

## 2021-01-02 DIAGNOSIS — R0683 Snoring: Secondary | ICD-10-CM

## 2021-01-02 DIAGNOSIS — I5022 Chronic systolic (congestive) heart failure: Secondary | ICD-10-CM

## 2021-01-02 LAB — LIPID PANEL
Cholesterol: 178 mg/dL (ref 0–200)
HDL: 64 mg/dL (ref >40)
LDL Cholesterol: 98 mg/dL (ref 0–99)
Total CHOL/HDL Ratio: 2.8 RATIO
Triglycerides: 82 mg/dL (ref <150)
VLDL: 16 mg/dL (ref 0–40)

## 2021-01-02 LAB — COMPREHENSIVE METABOLIC PANEL
ALT: 23 U/L (ref 0–44)
AST: 23 U/L (ref 15–41)
Albumin: 4 g/dL (ref 3.5–5.0)
Alkaline Phosphatase: 45 U/L (ref 38–126)
Anion gap: 9 (ref 5–15)
BUN: 21 mg/dL (ref 8–23)
CO2: 23 mmol/L (ref 22–32)
Calcium: 9.6 mg/dL (ref 8.9–10.3)
Chloride: 105 mmol/L (ref 98–111)
Creatinine, Ser: 1.42 mg/dL — ABNORMAL HIGH (ref 0.61–1.24)
GFR, Estimated: 56 mL/min — ABNORMAL LOW (ref >60)
Glucose, Bld: 89 mg/dL (ref 70–99)
Potassium: 4.1 mmol/L (ref 3.5–5.1)
Sodium: 137 mmol/L (ref 135–145)
Total Bilirubin: 0.7 mg/dL (ref 0.3–1.2)
Total Protein: 7.1 g/dL (ref 6.5–8.1)

## 2021-01-02 NOTE — Patient Instructions (Addendum)
EKG was performed today  Labs were performed today, if any labs are abnormal the clinic will call you  Your physician recommends that you schedule a follow-up appointment in: as scheduled for October 14th 2022 at 12:00 pm  At the [facility], you and your health needs are our priority. As part of our continuing mission to provide you with exceptional heart care, we have created designated Provider Care Teams. These Care Teams include your primary Cardiologist (physician) and Advanced Practice Providers (APPs- Physician Assistants and Nurse Practitioners) who all work together to provide you with the care you need, when you need it.   You may see any of the following providers on your designated Care Team at your next follow up: Dr Glori Bickers Dr Loralie Champagne Dr Patrice Paradise, NP Lyda Jester, [place] Ginnie Smart Audry Riles, PharmD   Please be sure to bring in all your medications bottles to every appointment.    If you have any questions or concerns before your next appointment please send [place] a message through Meta or call our office at [phone].    TO LEAVE A MESSAGE FOR THE NURSE SELECT OPTION 2, PLEASE LEAVE A MESSAGE INCLUDING: YOUR NAME DATE OF BIRTH CALL BACK NUMBER REASON FOR CALL**this is important as we prioritize the call backs  YOU WILL RECEIVE A CALL BACK THE SAME DAY AS LONG AS YOU CALL BEFORE 4:00 PM

## 2021-01-04 ENCOUNTER — Encounter (HOSPITAL_COMMUNITY): Payer: Medicaid Other

## 2021-01-04 NOTE — Addendum Note (Signed)
Encounter addended by: Rafael Bihari, FNP on: 01/04/2021 8:28 AM  Actions taken: Clinical Note Signed

## 2021-01-09 NOTE — Addendum Note (Signed)
Encounter addended by: Micki Riley, RN on: 01/09/2021 1:20 PM  Actions taken: Imaging Exam ended

## 2021-01-09 NOTE — H&P (View-Only) (Signed)
Electrophysiology Office Follow up Visit Note:    Date:  01/10/2021   ID:  Hunter Miller, DOB June 28, 1960, MRN [id number]  PCP:  Gala Lewandowsky, MD  [facility] Cardiologist:  None  [facility] Electrophysiologist:  Vickie Epley, MD    Interval History:    Hunter Miller is a 60 y.o. male who presents for a follow up visit after his atrial flutter ablation on 12/08/2020. During the typical atrial flutter ablation a CTI line was ablated but atypical flutter and atrial fibrillation was observed. He will require repeat ablation to target these arrhythmias.  Today he is doing overall well.  He is inquiring about filing for disability.  He works as an Clinical biochemist.  An in-person interpreter was used throughout today's encounter.  Past Medical History:  Diagnosis Date   HTN (hypertension)     Past Surgical History:  Procedure Laterality Date   A-FLUTTER ABLATION N/A 12/08/2020   Procedure: A-FLUTTER ABLATION;  Surgeon: Vickie Epley, MD;  Location: [place] CV LAB;  Service: Cardiovascular;  Laterality: N/A;   CARDIOVERSION N/A 11/07/2020   Procedure: CARDIOVERSION;  Surgeon: Jolaine Artist, MD;  Location: [facility] ENDOSCOPY;  Service: Cardiovascular;  Laterality: N/A;   CARDIOVERSION N/A 12/21/2020   Procedure: CARDIOVERSION;  Surgeon: Sueanne Margarita, MD;  Location: [facility] ENDOSCOPY;  Service: Cardiovascular;  Laterality: N/A;   RIGHT/LEFT HEART CATH AND CORONARY ANGIOGRAPHY N/A 11/04/2020   Procedure: RIGHT/LEFT HEART CATH AND CORONARY ANGIOGRAPHY;  Surgeon: Martinique, Peter M, MD;  Location: [place] CV LAB;  Service: Cardiovascular;  Laterality: N/A;   RIGHT/LEFT HEART CATH AND CORONARY/GRAFT ANGIOGRAPHY N/A 11/04/2020   Procedure: RIGHT/LEFT HEART CATH AND CORONARY/GRAFT ANGIOGRAPHY;  Surgeon: Martinique, Peter M, MD;  Location: [place] CV LAB;  Service: Cardiovascular;  Laterality: N/A;   TEE WITHOUT CARDIOVERSION N/A 11/07/2020   Procedure: TRANSESOPHAGEAL  ECHOCARDIOGRAM (TEE);  Surgeon: Jolaine Artist, MD;  Location: [facility] ENDOSCOPY;  Service: Cardiovascular;  Laterality: N/A;    Current Medications: No outpatient medications have been marked as taking for the 01/10/21 encounter (Office Visit) with Vickie Epley, MD.     Allergies:   Patient has no known allergies.   Social History   Socioeconomic History   Marital status: Married    Spouse name: Not on file   Number of children: Not on file   Years of education: Not on file   Highest education level: Not on file  Occupational History   Not on file  Tobacco Use   Smoking status: Never   Smokeless tobacco: Never  Substance and Sexual Activity   Alcohol use: Never   Drug use: Never   Sexual activity: Not on file  Other Topics Concern   Not on file  Social History Narrative   Not on file   Social Determinants of Health   Financial Resource Strain: Medium Risk   Difficulty of Paying Living Expenses: Somewhat hard  Food Insecurity: Food Insecurity Present   Worried About Running Out of Food in the Last Year: Sometimes true   Ran Out of Food in the Last Year: Sometimes true  Transportation Needs: No Transportation Needs   Lack of Transportation (Medical): No   Lack of Transportation (Non-Medical): No  Physical Activity: Not on file  Stress: Not on file  Social Connections: Not on file     Family History: The patient's family history includes Asthma in his father; COPD in his father.  ROS:   Please see the history of present illness.  All other systems reviewed and are negative.  EKGs/Labs/Other Studies Reviewed:    The following studies were reviewed today:  12/14/2020 Zio personally reviewed 51% atrial flutter  11/07/2020 TEE EF20-25% RV moderately reduced LA severely dilated Mild to moderate MR  EKG:  The ekg ordered today demonstrates atypical atrial flutter with variable AV conduction and a ventricular rate of 62 bpm  Recent Labs: 11/04/2020:  Magnesium 2.1; TSH 0.847 12/14/2020: Hemoglobin 14.6; Platelets 284 01/02/2021: ALT 23; BUN 21; Creatinine, Ser 1.42; Potassium 4.1; Sodium 137  Recent Lipid Panel    Component Value Date/Time   CHOL 178 01/02/2021 0959   TRIG 82 01/02/2021 0959   HDL 64 01/02/2021 0959   CHOLHDL 2.8 01/02/2021 0959   VLDL 16 01/02/2021 0959   LDLCALC 98 01/02/2021 0959   LDLDIRECT 171.4 (H) 11/09/2020 1855    Physical Exam:    VS:  BP 114/84   Pulse 62   Ht '5\' 6"'$  (1.676 m)   Wt 170 lb 12.8 oz (77.5 kg)   SpO2 90%   BMI 27.57 kg/m     Wt Readings from Last 3 Encounters:  01/10/21 170 lb 12.8 oz (77.5 kg)  01/02/21 170 lb 12.8 oz (77.5 kg)  12/16/20 169 lb 3.2 oz (76.7 kg)     GEN:  Well nourished, well developed in no acute distress HEENT: Normal NECK: No JVD; No carotid bruits LYMPHATICS: No lymphadenopathy CARDIAC: Irregularly irregular, no murmurs, rubs, gallops RESPIRATORY:  Clear to auscultation without rales, wheezing or rhonchi  ABDOMEN: Soft, non-tender, non-distended MUSCULOSKELETAL:  No edema; No deformity  SKIN: Warm and dry NEUROLOGIC:  Alert and oriented x 3 PSYCHIATRIC:  Normal affect   ASSESSMENT:    1. Atypical atrial flutter ([place])   2. Chronic systolic heart failure (HCC)   3. Paroxysmal atrial fibrillation ([place])   4. Pituitary macroadenoma (HCC)    PLAN:    In order of problems listed above:  #Atypical atrial flutter and paroxysmal atrial fibrillation A rhythm control strategy is indicated given his left ventricular dysfunction.  I recommended EP study and ablation in an effort to avoid long-term exposure to amiodarone given his young age.  Today we discussed the procedure in detail including the risks and he wishes to proceed with scheduling.  He will continue the Eliquis uninterrupted.  He will continue the amiodarone until at least 3 months after the ablation procedure.  Ablation strategy will be PVI plus posterior wall plus atypical atrial flutter  ablation.   Risk, benefits, and alternatives to EP study and radiofrequency ablation for afib were also discussed in detail today. These risks include but are not limited to stroke, bleeding, vascular damage, tamponade, perforation, damage to the esophagus, lungs, and other structures, pulmonary vein stenosis, worsening renal function, and death. The patient understands these risk and wishes to proceed.  We will therefore proceed with catheter ablation at the next available time.  Carto, ICE, anesthesia are requested for the procedure.  Will also obtain CT PV protocol prior to the procedure to exclude LAA thrombus and further evaluate atrial anatomy.   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  Warm and relatively dry on exam today.  A rhythm control strategy is indicated as above.  He should continue taking his Farxiga, losartan, metoprolol and spironolactone.  #Pituitary macroadenoma No acute surgical intervention is planned     Total time spent with patient today 45 minutes. This includes reviewing records, evaluating the patient and coordinating care.  Medication Adjustments/Labs and Tests Ordered: Current medicines are reviewed at length with the patient today.  Concerns regarding medicines are outlined above.  No orders of the defined types were placed in this encounter.  No orders of the defined types were placed in this encounter.    Signed, Lars Mage, MD, [facility], [facility] 01/10/2021 12:37 PM    Electrophysiology [facility]

## 2021-01-09 NOTE — Progress Notes (Signed)
Electrophysiology Office Follow up Visit Note:    Date:  01/10/2021   ID:  Hunter Miller, DOB 1961/02/11, MRN [id number]  PCP:  Gala Lewandowsky, MD  [facility] Cardiologist:  None  [facility] Electrophysiologist:  Vickie Epley, MD    Interval History:    Hunter Miller is a 60 y.o. male who presents for a follow up visit after his atrial flutter ablation on 12/08/2020. During the typical atrial flutter ablation a CTI line was ablated but atypical flutter and atrial fibrillation was observed. He will require repeat ablation to target these arrhythmias.  Today he is doing overall well.  He is inquiring about filing for disability.  He works as an Clinical biochemist.  An in-person interpreter was used throughout today's encounter.  Past Medical History:  Diagnosis Date   HTN (hypertension)     Past Surgical History:  Procedure Laterality Date   A-FLUTTER ABLATION N/A 12/08/2020   Procedure: A-FLUTTER ABLATION;  Surgeon: Vickie Epley, MD;  Location: [place] CV LAB;  Service: Cardiovascular;  Laterality: N/A;   CARDIOVERSION N/A 11/07/2020   Procedure: CARDIOVERSION;  Surgeon: Jolaine Artist, MD;  Location: [facility] ENDOSCOPY;  Service: Cardiovascular;  Laterality: N/A;   CARDIOVERSION N/A 12/21/2020   Procedure: CARDIOVERSION;  Surgeon: Sueanne Margarita, MD;  Location: [facility] ENDOSCOPY;  Service: Cardiovascular;  Laterality: N/A;   RIGHT/LEFT HEART CATH AND CORONARY ANGIOGRAPHY N/A 11/04/2020   Procedure: RIGHT/LEFT HEART CATH AND CORONARY ANGIOGRAPHY;  Surgeon: Martinique, Peter M, MD;  Location: [place] CV LAB;  Service: Cardiovascular;  Laterality: N/A;   RIGHT/LEFT HEART CATH AND CORONARY/GRAFT ANGIOGRAPHY N/A 11/04/2020   Procedure: RIGHT/LEFT HEART CATH AND CORONARY/GRAFT ANGIOGRAPHY;  Surgeon: Martinique, Peter M, MD;  Location: [place] CV LAB;  Service: Cardiovascular;  Laterality: N/A;   TEE WITHOUT CARDIOVERSION N/A 11/07/2020   Procedure: TRANSESOPHAGEAL  ECHOCARDIOGRAM (TEE);  Surgeon: Jolaine Artist, MD;  Location: [facility] ENDOSCOPY;  Service: Cardiovascular;  Laterality: N/A;    Current Medications: No outpatient medications have been marked as taking for the 01/10/21 encounter (Office Visit) with Vickie Epley, MD.     Allergies:   Patient has no known allergies.   Social History   Socioeconomic History   Marital status: Married    Spouse name: Not on file   Number of children: Not on file   Years of education: Not on file   Highest education level: Not on file  Occupational History   Not on file  Tobacco Use   Smoking status: Never   Smokeless tobacco: Never  Substance and Sexual Activity   Alcohol use: Never   Drug use: Never   Sexual activity: Not on file  Other Topics Concern   Not on file  Social History Narrative   Not on file   Social Determinants of Health   Financial Resource Strain: Medium Risk   Difficulty of Paying Living Expenses: Somewhat hard  Food Insecurity: Food Insecurity Present   Worried About Running Out of Food in the Last Year: Sometimes true   Ran Out of Food in the Last Year: Sometimes true  Transportation Needs: No Transportation Needs   Lack of Transportation (Medical): No   Lack of Transportation (Non-Medical): No  Physical Activity: Not on file  Stress: Not on file  Social Connections: Not on file     Family History: The patient's family history includes Asthma in his father; COPD in his father.  ROS:   Please see the history of present illness.  All other systems reviewed and are negative.  EKGs/Labs/Other Studies Reviewed:    The following studies were reviewed today:  12/14/2020 Zio personally reviewed 51% atrial flutter  11/07/2020 TEE EF20-25% RV moderately reduced LA severely dilated Mild to moderate MR  EKG:  The ekg ordered today demonstrates atypical atrial flutter with variable AV conduction and a ventricular rate of 62 bpm  Recent Labs: 11/04/2020:  Magnesium 2.1; TSH 0.847 12/14/2020: Hemoglobin 14.6; Platelets 284 01/02/2021: ALT 23; BUN 21; Creatinine, Ser 1.42; Potassium 4.1; Sodium 137  Recent Lipid Panel    Component Value Date/Time   CHOL 178 01/02/2021 0959   TRIG 82 01/02/2021 0959   HDL 64 01/02/2021 0959   CHOLHDL 2.8 01/02/2021 0959   VLDL 16 01/02/2021 0959   LDLCALC 98 01/02/2021 0959   LDLDIRECT 171.4 (H) 11/09/2020 1855    Physical Exam:    VS:  BP 114/84   Pulse 62   Ht '5\' 6"'$  (1.676 m)   Wt 170 lb 12.8 oz (77.5 kg)   SpO2 90%   BMI 27.57 kg/m     Wt Readings from Last 3 Encounters:  01/10/21 170 lb 12.8 oz (77.5 kg)  01/02/21 170 lb 12.8 oz (77.5 kg)  12/16/20 169 lb 3.2 oz (76.7 kg)     GEN:  Well nourished, well developed in no acute distress HEENT: Normal NECK: No JVD; No carotid bruits LYMPHATICS: No lymphadenopathy CARDIAC: Irregularly irregular, no murmurs, rubs, gallops RESPIRATORY:  Clear to auscultation without rales, wheezing or rhonchi  ABDOMEN: Soft, non-tender, non-distended MUSCULOSKELETAL:  No edema; No deformity  SKIN: Warm and dry NEUROLOGIC:  Alert and oriented x 3 PSYCHIATRIC:  Normal affect   ASSESSMENT:    1. Atypical atrial flutter ([place])   2. Chronic systolic heart failure (HCC)   3. Paroxysmal atrial fibrillation ([place])   4. Pituitary macroadenoma (HCC)    PLAN:    In order of problems listed above:  #Atypical atrial flutter and paroxysmal atrial fibrillation A rhythm control strategy is indicated given his left ventricular dysfunction.  I recommended EP study and ablation in an effort to avoid long-term exposure to amiodarone given his young age.  Today we discussed the procedure in detail including the risks and he wishes to proceed with scheduling.  He will continue the Eliquis uninterrupted.  He will continue the amiodarone until at least 3 months after the ablation procedure.  Ablation strategy will be PVI plus posterior wall plus atypical atrial flutter  ablation.   Risk, benefits, and alternatives to EP study and radiofrequency ablation for afib were also discussed in detail today. These risks include but are not limited to stroke, bleeding, vascular damage, tamponade, perforation, damage to the esophagus, lungs, and other structures, pulmonary vein stenosis, worsening renal function, and death. The patient understands these risk and wishes to proceed.  We will therefore proceed with catheter ablation at the next available time.  Carto, ICE, anesthesia are requested for the procedure.  Will also obtain CT PV protocol prior to the procedure to exclude LAA thrombus and further evaluate atrial anatomy.   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  Warm and relatively dry on exam today.  A rhythm control strategy is indicated as above.  He should continue taking his Farxiga, losartan, metoprolol and spironolactone.  #Pituitary macroadenoma No acute surgical intervention is planned     Total time spent with patient today 45 minutes. This includes reviewing records, evaluating the patient and coordinating care.  Medication Adjustments/Labs and Tests Ordered: Current medicines are reviewed at length with the patient today.  Concerns regarding medicines are outlined above.  No orders of the defined types were placed in this encounter.  No orders of the defined types were placed in this encounter.    Signed, Lars Mage, MD, [facility], [facility] 01/10/2021 12:37 PM    Electrophysiology [facility]

## 2021-01-09 NOTE — Addendum Note (Signed)
Encounter addended by: Micki Riley, RN on: 01/09/2021 1:25 PM  Actions taken: Imaging Exam ended

## 2021-01-10 ENCOUNTER — Ambulatory Visit (INDEPENDENT_AMBULATORY_CARE_PROVIDER_SITE_OTHER): Payer: Medicaid Other | Admitting: Cardiology

## 2021-01-10 ENCOUNTER — Other Ambulatory Visit: Payer: Self-pay

## 2021-01-10 VITALS — BP 114/84 | HR 62 | Ht 66.0 in | Wt 170.8 lb

## 2021-01-10 DIAGNOSIS — I5022 Chronic systolic (congestive) heart failure: Secondary | ICD-10-CM | POA: Diagnosis not present

## 2021-01-10 DIAGNOSIS — D352 Benign neoplasm of pituitary gland: Secondary | ICD-10-CM | POA: Diagnosis not present

## 2021-01-10 DIAGNOSIS — I4891 Unspecified atrial fibrillation: Secondary | ICD-10-CM

## 2021-01-10 DIAGNOSIS — I480 Paroxysmal atrial fibrillation: Secondary | ICD-10-CM

## 2021-01-10 DIAGNOSIS — I484 Atypical atrial flutter: Secondary | ICD-10-CM | POA: Diagnosis not present

## 2021-01-10 LAB — CBC WITH DIFFERENTIAL/PLATELET
Basophils Absolute: 0 10*3/uL (ref 0.0–0.2)
Basos: 1 %
EOS (ABSOLUTE): 0.2 10*3/uL (ref 0.0–0.4)
Eos: 4 %
Hematocrit: 38.8 % (ref 37.5–51.0)
Hemoglobin: 13.2 g/dL (ref 13.0–17.7)
Lymphocytes Absolute: 2.1 10*3/uL (ref 0.7–3.1)
Lymphs: 33 %
MCH: 27.6 pg (ref 26.6–33.0)
MCHC: 34 g/dL (ref 31.5–35.7)
MCV: 81 fL (ref 79–97)
Monocytes Absolute: 0.6 10*3/uL (ref 0.1–0.9)
Monocytes: 10 %
Neutrophils Absolute: 3.4 10*3/uL (ref 1.4–7.0)
Neutrophils: 52 %
Platelets: 230 10*3/uL (ref 150–450)
RBC: 4.78 x10E6/uL (ref 4.14–5.80)
RDW: 15.2 % (ref 11.6–15.4)
WBC: 6.3 10*3/uL (ref 3.4–10.8)

## 2021-01-10 NOTE — Patient Instructions (Addendum)
Medication Instructions:  Your physician recommends that you continue on your current medications as directed. Please refer to the Current Medication list given to you today. *If you need a refill on your cardiac medications before your next appointment, please call your pharmacy*  Lab Work: You will get lab work today:  CBC If you have labs (blood work) drawn today and your tests are completely normal, you will receive your results only by: MyChart Message (if you have MyChart) OR A paper copy in the mail If you have any lab test that is abnormal or we need to change your treatment, we will call you to review the results.  Testing/Procedures: None ordered.  Follow-Up: At [facility], you and your health needs are our priority.  As part of our continuing mission to provide you with exceptional heart care, we have created designated Provider Care Teams.  These Care Teams include your primary Cardiologist (physician) and Advanced Practice Providers (APPs -  Physician Assistants and Nurse Practitioners) who all work together to provide you with the care you need, when you need it.  Your next appointment:    SEE INSTRUCTION LETTER

## 2021-01-18 ENCOUNTER — Telehealth (HOSPITAL_COMMUNITY): Payer: Self-pay | Admitting: Emergency Medicine

## 2021-01-18 ENCOUNTER — Telehealth (HOSPITAL_COMMUNITY): Payer: Self-pay | Admitting: *Deleted

## 2021-01-18 ENCOUNTER — Telehealth: Payer: Self-pay

## 2021-01-18 NOTE — Telephone Encounter (Signed)
Attempted to call patient regarding upcoming cardiac CT appointment. °Left message on voicemail with name and callback number °Munira Polson RN Navigator Cardiac Imaging °[facility] °[phone] Office °[phone] Cell ° °

## 2021-01-18 NOTE — Telephone Encounter (Signed)
Patient's daughter returning a phone called regarding patient's cardiac CT appointment scheduled for 9/30 at 8:30am.  She states that her father won't be able to make that appointment. Patient's appointment has been canceled. She will call back to reschedule when she has his availability figured out.  Gordy Clement, RN nurse navigator Office: [phone]

## 2021-01-18 NOTE — Telephone Encounter (Signed)
Left message requesting call back.  Daughter called CT (no DPR on file)  Pt is scheduled for afib ablation on 01/27/2021.  Needs cardiac CT prior to ablation

## 2021-01-19 ENCOUNTER — Telehealth: Payer: Self-pay | Admitting: Cardiology

## 2021-01-19 NOTE — Telephone Encounter (Signed)
Patient's daughter calling to find out if the patient needs the CT prior to his surgery. She would also like to know what time he needs to be at the hospital for the surgery.

## 2021-01-19 NOTE — Telephone Encounter (Signed)
Pt has had CT rescheduled.

## 2021-01-19 NOTE — Telephone Encounter (Signed)
Left 2nd message requesting call back.  Pt's daughter cancelled cardiac CT prior to afib ablation on 01/27/21.  Either reschedule CT or cancel ablation.

## 2021-01-20 ENCOUNTER — Ambulatory Visit (HOSPITAL_COMMUNITY): Admission: RE | Admit: 2021-01-20 | Payer: Medicaid Other | Source: Ambulatory Visit

## 2021-01-20 ENCOUNTER — Other Ambulatory Visit: Payer: Self-pay

## 2021-01-20 ENCOUNTER — Ambulatory Visit (HOSPITAL_COMMUNITY)
Admission: RE | Admit: 2021-01-20 | Discharge: 2021-01-20 | Disposition: A | Discharge status: Home / Self Care | Payer: Medicaid Other | Source: Ambulatory Visit | Attending: Cardiology | Admitting: Cardiology

## 2021-01-20 DIAGNOSIS — I4891 Unspecified atrial fibrillation: Secondary | ICD-10-CM

## 2021-01-20 MED ORDER — IOHEXOL 350 MG/ML SOLN
100.0000 mL | Freq: Once | INTRAVENOUS | Status: AC | PRN
  Administered 2021-01-20: 100 mL via INTRAVENOUS

## 2021-01-26 NOTE — Pre-Procedure Instructions (Signed)
Attempted to call patient regarding procedure instructions for tomorrow.  No answer. 

## 2021-01-27 ENCOUNTER — Ambulatory Visit (HOSPITAL_COMMUNITY)
Admission: RE | Disposition: A | Discharge status: Home / Self Care | Payer: Self-pay | Source: Home / Self Care | Attending: Cardiology

## 2021-01-27 ENCOUNTER — Ambulatory Visit (HOSPITAL_COMMUNITY): Payer: Medicaid Other | Admitting: Certified Registered Nurse Anesthetist

## 2021-01-27 ENCOUNTER — Ambulatory Visit (HOSPITAL_COMMUNITY)
Admission: RE | Admit: 2021-01-27 | Discharge: 2021-01-27 | Disposition: A | Discharge status: Home / Self Care | Payer: Medicaid Other | Attending: Cardiology | Admitting: Cardiology

## 2021-01-27 ENCOUNTER — Other Ambulatory Visit: Payer: Self-pay

## 2021-01-27 DIAGNOSIS — I5042 Chronic combined systolic (congestive) and diastolic (congestive) heart failure: Secondary | ICD-10-CM | POA: Insufficient documentation

## 2021-01-27 DIAGNOSIS — D352 Benign neoplasm of pituitary gland: Secondary | ICD-10-CM | POA: Diagnosis not present

## 2021-01-27 DIAGNOSIS — I110 Hypertensive heart disease with heart failure: Secondary | ICD-10-CM | POA: Diagnosis not present

## 2021-01-27 DIAGNOSIS — I4819 Other persistent atrial fibrillation: Secondary | ICD-10-CM | POA: Insufficient documentation

## 2021-01-27 DIAGNOSIS — I484 Atypical atrial flutter: Secondary | ICD-10-CM | POA: Insufficient documentation

## 2021-01-27 DIAGNOSIS — I428 Other cardiomyopathies: Secondary | ICD-10-CM | POA: Diagnosis not present

## 2021-01-27 HISTORY — PX: ATRIAL FIBRILLATION ABLATION: EP1191

## 2021-01-27 SURGERY — ATRIAL FIBRILLATION ABLATION
Anesthesia: General

## 2021-01-27 MED ORDER — PHENYLEPHRINE HCL-NACL 20-0.9 MG/250ML-% IV SOLN
INTRAVENOUS | Status: DC | PRN
  Administered 2021-01-27: 50 ug/min via INTRAVENOUS

## 2021-01-27 MED ORDER — PANTOPRAZOLE SODIUM 40 MG PO TBEC: 40.0000 mg | DELAYED_RELEASE_TABLET | Freq: Every day | ORAL | 0 refills | Status: DC

## 2021-01-27 MED ORDER — SODIUM CHLORIDE 0.9 % IV SOLN: INTRAVENOUS | Status: DC

## 2021-01-27 MED ORDER — ROCURONIUM BROMIDE 10 MG/ML (PF) SYRINGE
PREFILLED_SYRINGE | INTRAVENOUS | Status: DC | PRN
  Administered 2021-01-27: 10 mg via INTRAVENOUS
  Administered 2021-01-27: 70 mg via INTRAVENOUS

## 2021-01-27 MED ORDER — HEPARIN (PORCINE) IN NACL 1000-0.9 UT/500ML-% IV SOLN
INTRAVENOUS | Status: AC
  Filled 2021-01-27: qty 2000

## 2021-01-27 MED ORDER — ONDANSETRON HCL 4 MG/2ML IJ SOLN: 4.0000 mg | Freq: Four times a day (QID) | INTRAMUSCULAR | Status: DC | PRN

## 2021-01-27 MED ORDER — ONDANSETRON HCL 4 MG/2ML IJ SOLN
INTRAMUSCULAR | Status: DC | PRN
  Administered 2021-01-27: 4 mg via INTRAVENOUS

## 2021-01-27 MED ORDER — SODIUM CHLORIDE 0.9% FLUSH: 3.0000 mL | INTRAVENOUS | Status: DC | PRN

## 2021-01-27 MED ORDER — SODIUM CHLORIDE 0.9 % IV SOLN: 250.0000 mL | INTRAVENOUS | Status: DC | PRN

## 2021-01-27 MED ORDER — DEXAMETHASONE SODIUM PHOSPHATE 10 MG/ML IJ SOLN
INTRAMUSCULAR | Status: DC | PRN
  Administered 2021-01-27: 5 mg via INTRAVENOUS

## 2021-01-27 MED ORDER — ISOPROTERENOL HCL 0.2 MG/ML IJ SOLN
INTRAVENOUS | Status: DC | PRN
  Administered 2021-01-27: 2 ug/min via INTRAVENOUS

## 2021-01-27 MED ORDER — FUROSEMIDE 10 MG/ML IJ SOLN
INTRAMUSCULAR | Status: AC
  Filled 2021-01-27: qty 4

## 2021-01-27 MED ORDER — ISOPROTERENOL HCL 0.2 MG/ML IJ SOLN
INTRAMUSCULAR | Status: AC
  Filled 2021-01-27: qty 5

## 2021-01-27 MED ORDER — PHENYLEPHRINE 40 MCG/ML (10ML) SYRINGE FOR IV PUSH (FOR BLOOD PRESSURE SUPPORT)
PREFILLED_SYRINGE | INTRAVENOUS | Status: DC | PRN
  Administered 2021-01-27 (×2): 120 ug via INTRAVENOUS

## 2021-01-27 MED ORDER — HEPARIN SODIUM (PORCINE) 1000 UNIT/ML IJ SOLN
INTRAMUSCULAR | Status: AC
  Filled 2021-01-27: qty 1

## 2021-01-27 MED ORDER — APIXABAN 5 MG PO TABS
5.0000 mg | ORAL_TABLET | Freq: Two times a day (BID) | ORAL | Status: DC
  Administered 2021-01-27: 5 mg via ORAL
  Filled 2021-01-27: qty 1

## 2021-01-27 MED ORDER — FENTANYL CITRATE (PF) 100 MCG/2ML IJ SOLN
INTRAMUSCULAR | Status: DC | PRN
  Administered 2021-01-27: 100 ug via INTRAVENOUS

## 2021-01-27 MED ORDER — ACETAMINOPHEN 325 MG PO TABS
650.0000 mg | ORAL_TABLET | ORAL | Status: DC | PRN
  Filled 2021-01-27: qty 2

## 2021-01-27 MED ORDER — PROPOFOL 10 MG/ML IV BOLUS
INTRAVENOUS | Status: DC | PRN
  Administered 2021-01-27: 100 mg via INTRAVENOUS

## 2021-01-27 MED ORDER — LIDOCAINE HCL (PF) 2 % IJ SOLN
INTRAMUSCULAR | Status: DC | PRN
  Administered 2021-01-27: 80 mg via INTRADERMAL

## 2021-01-27 MED ORDER — FUROSEMIDE 10 MG/ML IJ SOLN
INTRAMUSCULAR | Status: DC | PRN
  Administered 2021-01-27: 20 mg via INTRAMUSCULAR

## 2021-01-27 MED ORDER — COLCHICINE 0.6 MG PO TABS
0.6000 mg | ORAL_TABLET | Freq: Two times a day (BID) | ORAL | Status: DC
  Administered 2021-01-27: 0.6 mg via ORAL
  Filled 2021-01-27: qty 1

## 2021-01-27 MED ORDER — SODIUM CHLORIDE 0.9% FLUSH: 3.0000 mL | Freq: Two times a day (BID) | INTRAVENOUS | Status: DC

## 2021-01-27 MED ORDER — HEPARIN (PORCINE) IN NACL 1000-0.9 UT/500ML-% IV SOLN
INTRAVENOUS | Status: DC | PRN
  Administered 2021-01-27 (×3): 500 mL

## 2021-01-27 MED ORDER — PANTOPRAZOLE SODIUM 40 MG PO TBEC
40.0000 mg | DELAYED_RELEASE_TABLET | Freq: Every day | ORAL | Status: DC
  Administered 2021-01-27: 40 mg via ORAL
  Filled 2021-01-27: qty 1

## 2021-01-27 MED ORDER — HEPARIN SODIUM (PORCINE) 1000 UNIT/ML IJ SOLN
INTRAMUSCULAR | Status: DC | PRN
  Administered 2021-01-27: 4000 [IU] via INTRAVENOUS
  Administered 2021-01-27: 5000 [IU] via INTRAVENOUS
  Administered 2021-01-27: 13000 [IU] via INTRAVENOUS

## 2021-01-27 MED ORDER — COLCHICINE 0.6 MG PO TABS: 0.6000 mg | ORAL_TABLET | Freq: Two times a day (BID) | ORAL | 0 refills | Status: DC

## 2021-01-27 MED ORDER — MIDAZOLAM HCL 2 MG/2ML IJ SOLN
INTRAMUSCULAR | Status: DC | PRN
  Administered 2021-01-27: 2 mg via INTRAVENOUS

## 2021-01-27 MED ORDER — HEPARIN SODIUM (PORCINE) 1000 UNIT/ML IJ SOLN
INTRAMUSCULAR | Status: DC | PRN
  Administered 2021-01-27: 1000 [IU] via INTRAVENOUS

## 2021-01-27 MED ORDER — SUGAMMADEX SODIUM 200 MG/2ML IV SOLN
INTRAVENOUS | Status: DC | PRN
  Administered 2021-01-27: 200 mg via INTRAVENOUS

## 2021-01-27 MED ORDER — PROTAMINE SULFATE 10 MG/ML IV SOLN
INTRAVENOUS | Status: DC | PRN
Start: 2021-01-27 — End: 2021-01-27
  Administered 2021-01-27: 35 mg via INTRAVENOUS

## 2021-01-27 SURGICAL SUPPLY — 18 items
BLANKET WARM UNDERBOD FULL ACC (MISCELLANEOUS) ×2 IMPLANT
CATH OCTARAY 2.0 F 3-3-3-3-3 (CATHETERS) ×2 IMPLANT
CATH S CIRCA THERM PROBE 10F (CATHETERS) ×2 IMPLANT
CATH SMTCH THERMOCOOL SF DF (CATHETERS) ×2 IMPLANT
CATH SOUNDSTAR ECO 8FR (CATHETERS) ×2 IMPLANT
CATH WEB BI DIR CSDF CRV REPRO (CATHETERS) ×2 IMPLANT
CLOSURE PERCLOSE PROSTYLE (VASCULAR PRODUCTS) ×6 IMPLANT
COVER SWIFTLINK CONNECTOR (BAG) ×2 IMPLANT
PACK EP LATEX FREE (CUSTOM PROCEDURE TRAY) ×1
PACK EP LF (CUSTOM PROCEDURE TRAY) ×1 IMPLANT
PAD PRO RADIOLUCENT 2001M-C (PAD) ×2 IMPLANT
PATCH CARTO3 (PAD) ×2 IMPLANT
SHEATH BAYLIS TRANSSEPTAL 98CM (NEEDLE) ×2 IMPLANT
SHEATH CARTO VIZIGO SM CVD (SHEATH) ×2 IMPLANT
SHEATH PINNACLE 8F 10CM (SHEATH) ×4 IMPLANT
SHEATH PINNACLE 9F 10CM (SHEATH) ×2 IMPLANT
SHEATH PROBE COVER 6X72 (BAG) ×2 IMPLANT
TUBING SMART ABLATE COOLFLOW (TUBING) ×2 IMPLANT

## 2021-01-27 NOTE — Interval H&P Note (Signed)
History and Physical Interval Note:  01/27/2021 10:25 AM  Hunter Miller  has presented today for surgery, with the diagnosis of Afib.  The various methods of treatment have been discussed with the patient and family. After consideration of risks, benefits and other options for treatment, the patient has consented to  Procedure(s): ATRIAL FIBRILLATION ABLATION (N/A) as a surgical intervention.  The patient's history has been reviewed, patient examined, no change in status, stable for surgery.  I have reviewed the patient's chart and labs.  Questions were answered to the patient's satisfaction.     Zahira Brummond T Roylee Chaffin

## 2021-01-27 NOTE — Progress Notes (Signed)
Pt ambulated without difficulty or bleeding.   Discharged home with his brother who will drive and stay with pt x 24 hrs.   AVS reviewed with brother and pt at bedside with interpreter.

## 2021-01-27 NOTE — Anesthesia Procedure Notes (Signed)
Procedure Name: Intubation Date/Time: 01/27/2021 10:54 AM Performed by: Janace Litten, CRNA Pre-anesthesia Checklist: Patient identified, Emergency Drugs available, Suction available and Patient being monitored Patient Re-evaluated:Patient Re-evaluated prior to induction Oxygen Delivery Method: Circle System Utilized Preoxygenation: Pre-oxygenation with 100% oxygen Induction Type: IV induction Ventilation: Mask ventilation without difficulty Laryngoscope Size: 4 and Mac Grade View: Grade I Tube type: Oral Tube size: 7.5 mm Number of attempts: 1 Airway Equipment and Method: Stylet and Oral airway Placement Confirmation: ETT inserted through vocal cords under direct vision, positive ETCO2 and breath sounds checked- equal and bilateral Secured at: 20 cm Tube secured with: Tape Dental Injury: Teeth and Oropharynx as per pre-operative assessment

## 2021-01-27 NOTE — Transfer of Care (Signed)
Immediate Anesthesia Transfer of Care Note  Patient: Hunter Miller  Procedure(s) Performed: ATRIAL FIBRILLATION ABLATION  Patient Location: PACU and Cath Lab  Anesthesia Type:General  Level of Consciousness: drowsy, patient cooperative and responds to stimulation  Airway & Oxygen Therapy: Patient Spontanous Breathing and Patient connected to nasal cannula oxygen  Post-op Assessment: Report given to RN and Post -op Vital signs reviewed and stable  Post vital signs: Reviewed and stable  Last Vitals:  Vitals Value Taken Time  BP 90/65 01/27/21 1324  Temp    Pulse 71 01/27/21 1325  Resp 14 01/27/21 1325  SpO2 95 % 01/27/21 1325  Vitals shown include unvalidated device data.  Last Pain:  Vitals:   01/27/21 0712  TempSrc:   PainSc: 0-No pain      Patients Stated Pain Goal: 3 (51/02/58 5277)  Complications: No notable events documented.

## 2021-01-27 NOTE — Anesthesia Preprocedure Evaluation (Addendum)
Anesthesia Evaluation  Patient identified by MRN, date of birth, ID band Patient awake    Reviewed: Allergy & Precautions, NPO status , Patient's Chart, lab work & pertinent test results, reviewed documented beta blocker date and time   History of Anesthesia Complications Negative for: history of anesthetic complications  Airway Mallampati: II  TM Distance: >3 FB Neck ROM: Full    Dental  (+) Dental Advisory Given, Missing, Edentulous Lower   Pulmonary neg pulmonary ROS,    Pulmonary exam normal        Cardiovascular hypertension, Pt. on medications and Pt. on home beta blockers +CHF  + dysrhythmias Atrial Fibrillation  Rhythm:Irregular Rate:Normal   TEE 11/07/20: 1. Left ventricular ejection fraction, by estimation, is 20 to 25%. The  left ventricle has severely decreased function. The left ventricle  demonstrates global hypokinesis.  2. Right ventricular systolic function is moderately reduced. The right  ventricular size is normal.  3. Left atrial size was severely dilated. No left atrial/left atrial  appendage thrombus was detected.  4. The mitral valve is normal in structure. Mild to moderate mitral valve  regurgitation.  5. The aortic valve is tricuspid. Aortic valve regurgitation is not  visualized.   Cath 11/04/20: .  LV end diastolic pressure is normal. 1. Very large and normal coronary arteries. 2. Normal LV filling pressures. 3. Normal right  heart pressures.  4. Reduced cardiac output. Index 2.26.    Neuro/Psych Pituitary adenoma negative neurological ROS     GI/Hepatic negative GI ROS, Neg liver ROS,   Endo/Other  negative endocrine ROS  Renal/GU negative Renal ROS  negative genitourinary   Musculoskeletal negative musculoskeletal ROS (+)   Abdominal   Peds  Hematology Eliquis   Anesthesia Other Findings   Reproductive/Obstetrics                           Anesthesia Physical Anesthesia Plan  ASA: 4  Anesthesia Plan: General   Post-op Pain Management:    Induction: Intravenous  PONV Risk Score and Plan: Ondansetron, Dexamethasone, Treatment may vary due to age or medical condition and Midazolam  Airway Management Planned: Oral ETT  Additional Equipment: None  Intra-op Plan:   Post-operative Plan: Extubation in OR  Informed Consent: I have reviewed the patients History and Physical, chart, labs and discussed the procedure including the risks, benefits and alternatives for the proposed anesthesia with the patient or authorized representative who has indicated his/her understanding and acceptance.     Dental advisory given  Plan Discussed with:   Anesthesia Plan Comments:         Anesthesia Quick Evaluation

## 2021-01-27 NOTE — Discharge Instructions (Signed)

## 2021-01-27 NOTE — Anesthesia Postprocedure Evaluation (Signed)
Anesthesia Post Note  Patient: Hunter Miller  Procedure(s) Performed: ATRIAL FIBRILLATION ABLATION     Patient location during evaluation: PACU Anesthesia Type: General Level of consciousness: awake and alert Pain management: pain level controlled Vital Signs Assessment: post-procedure vital signs reviewed and stable Respiratory status: spontaneous breathing, nonlabored ventilation and respiratory function stable Cardiovascular status: blood pressure returned to baseline and stable Postop Assessment: no apparent nausea or vomiting Anesthetic complications: no   No notable events documented.  Last Vitals:  Vitals:   01/27/21 1410 01/27/21 1425  BP: 95/71 100/73  Pulse: 67 67  Resp: 20 16  Temp:    SpO2: 90% 93%    Last Pain:  Vitals:   01/27/21 1425  TempSrc:   PainSc: 0-No pain                 Lidia Collum

## 2021-01-30 ENCOUNTER — Encounter (HOSPITAL_COMMUNITY): Payer: Self-pay | Admitting: Cardiology

## 2021-01-30 LAB — POCT ACTIVATED CLOTTING TIME
Activated Clotting Time: 254 seconds
Activated Clotting Time: 306 seconds
Activated Clotting Time: 341 seconds

## 2021-01-31 MED FILL — Furosemide Inj 10 MG/ML: INTRAMUSCULAR | Qty: 4 | Status: AC

## 2021-01-31 MED FILL — Heparin Sod (Porcine)-NaCl IV Soln 1000 Unit/500ML-0.9%: INTRAVENOUS | Qty: 500 | Status: AC

## 2021-02-03 ENCOUNTER — Other Ambulatory Visit: Payer: Self-pay

## 2021-02-03 ENCOUNTER — Ambulatory Visit (HOSPITAL_COMMUNITY)
Admission: RE | Admit: 2021-02-03 | Discharge: 2021-02-03 | Disposition: A | Discharge status: Home / Self Care | Payer: Medicaid Other | Source: Ambulatory Visit | Attending: Internal Medicine | Admitting: Internal Medicine

## 2021-02-03 ENCOUNTER — Ambulatory Visit (HOSPITAL_BASED_OUTPATIENT_CLINIC_OR_DEPARTMENT_OTHER)
Admission: RE | Admit: 2021-02-03 | Discharge: 2021-02-03 | Disposition: A | Discharge status: Home / Self Care | Payer: Medicaid Other | Source: Ambulatory Visit | Attending: Internal Medicine | Admitting: Internal Medicine

## 2021-02-03 ENCOUNTER — Encounter (HOSPITAL_COMMUNITY): Payer: Self-pay | Admitting: Internal Medicine

## 2021-02-03 VITALS — BP 128/82 | HR 65 | Wt 176.0 lb

## 2021-02-03 DIAGNOSIS — Z7901 Long term (current) use of anticoagulants: Secondary | ICD-10-CM | POA: Insufficient documentation

## 2021-02-03 DIAGNOSIS — E785 Hyperlipidemia, unspecified: Secondary | ICD-10-CM | POA: Insufficient documentation

## 2021-02-03 DIAGNOSIS — Q2112 Patent foramen ovale: Secondary | ICD-10-CM | POA: Diagnosis not present

## 2021-02-03 DIAGNOSIS — I4819 Other persistent atrial fibrillation: Secondary | ICD-10-CM | POA: Insufficient documentation

## 2021-02-03 DIAGNOSIS — I4891 Unspecified atrial fibrillation: Secondary | ICD-10-CM

## 2021-02-03 DIAGNOSIS — I5022 Chronic systolic (congestive) heart failure: Secondary | ICD-10-CM

## 2021-02-03 DIAGNOSIS — I10 Essential (primary) hypertension: Secondary | ICD-10-CM

## 2021-02-03 DIAGNOSIS — D352 Benign neoplasm of pituitary gland: Secondary | ICD-10-CM | POA: Insufficient documentation

## 2021-02-03 DIAGNOSIS — Z5941 Food insecurity: Secondary | ICD-10-CM | POA: Insufficient documentation

## 2021-02-03 DIAGNOSIS — Z596 Low income: Secondary | ICD-10-CM | POA: Insufficient documentation

## 2021-02-03 DIAGNOSIS — I110 Hypertensive heart disease with heart failure: Secondary | ICD-10-CM | POA: Diagnosis present

## 2021-02-03 DIAGNOSIS — Z79899 Other long term (current) drug therapy: Secondary | ICD-10-CM | POA: Insufficient documentation

## 2021-02-03 DIAGNOSIS — Z7984 Long term (current) use of oral hypoglycemic drugs: Secondary | ICD-10-CM | POA: Diagnosis not present

## 2021-02-03 DIAGNOSIS — I428 Other cardiomyopathies: Secondary | ICD-10-CM | POA: Insufficient documentation

## 2021-02-03 DIAGNOSIS — E119 Type 2 diabetes mellitus without complications: Secondary | ICD-10-CM | POA: Insufficient documentation

## 2021-02-03 DIAGNOSIS — I484 Atypical atrial flutter: Secondary | ICD-10-CM

## 2021-02-03 LAB — ECHOCARDIOGRAM COMPLETE
Area-P 1/2: 3.53 cm2
S' Lateral: 3.4 cm

## 2021-02-03 MED ORDER — LOSARTAN POTASSIUM 25 MG PO TABS: 25.0000 mg | ORAL_TABLET | Freq: Every day | ORAL | 3 refills | Status: DC

## 2021-02-03 NOTE — Progress Notes (Signed)
  Echocardiogram 2D Echocardiogram has been performed.  Darlina Sicilian M 02/03/2021, 11:20 AM

## 2021-02-03 NOTE — Patient Instructions (Addendum)
Electrocardiograma hecho hoy.  No Laboratorios hechos hoy.   AUMENTE losartn a 25 mg (1 tableta) por va oral diariamente a la hora de acostarse.  No se realizaron Financial trader. Contine con todos los medicamentos actuales segn lo prescrito.  Su mdico le recomienda programar una cita de seguimiento en: 3 meses con un eco antes de su examen.  Si tiene [place] pregunta o inquietud antes de su prxima cita, envenos un mensaje a travs de mychart o llame a nuestra oficina al [phone].  PARA DEJAR UN MENSAJE PARA LA ENFERMERA SELECCIONE LA OPCIN 2, POR FAVOR DEJE UN MENSAJE QUE INCLUYA:  SU NOMBRE  FECHA DE NACIMIENTO  NMERO DE DEVOLUCIN DE LLAMADA  MOTIVO DE LA LLAMADA**esto es importante ya que damos prioridad a las devoluciones de llamadas  RECIBIR UNA LLAMADA EL MISMO DA SIEMPRE QUE LLAME ANTES DE LAS 4:00 PM   Haz lo siguiente TODOS LOS DAS: 1) Psate por la maana antes del desayuno. Antelo y gurdelo en un registro. 2) Tome sus medicamentos segn lo prescrito 3) Coma alimentos bajos en sal: limite la sal (sodio) a 2000 mg por da. 4) Mantngase tan activo como pueda [employer] 5) Limite todos los lquidos del da a menos de 2 litros   En la [place] de [place], usted y sus necesidades de salud son Cleotis Nipper prioridad. Como parte de nuestra misin continua de brindarle una atencin cardaca excepcional, hemos creado equipos de atencin de proveedores designados. Estos equipos de atencin incluyen a su cardilogo primario (mdico) y proveedores de Financial planner (APP, asistentes mdicos y enfermeras practicantes) que trabajan juntos para brindarle la atencin que necesita, cuando la necesita.  Puede ver a cualquiera de los siguientes proveedores en su equipo de atencin designado en su prximo seguimiento:  Dr. Quillian Quince Bensimhon  Dr. Loralie Champagne  Amy Ninfa Meeker, NP  Imagene Gurney, [place]  Audry Riles, Doctora en  [place] de traer todas las botellas de sus medicamentos a cada cita.

## 2021-02-03 NOTE — Progress Notes (Signed)
Advanced Heart Failure Clinic Note    PCP: Gala Lewandowsky, MD HF Cardiologist: Dr. Haroldine Laws  HPI: Mr. Soter is a 60 y.o. male with history of possible right femoral artery versus cerebral angiogram in remote past (details not known), HTN, HLD, new diagnosis (as of 7/22) of atrial flutter and systolic heart failure.   Presented to [facility] with chest pain. Echo with EF of 20-25%, mild AV sclerosis with no stenosis, mild to moderate MR, trace TR and no pericardial effusion (per chart review, echo report not available). He was diuresed with IV lasix. Also found to be in atrial flutter with RVR.    He was transferred to [facility] on 11/04/2020 for further management. R/LHC 11/04/20  with normal corss, normal filling pressures, normal right heart pressures, reduced CO, CI 2.26.  Initiated on GDMT with Entresto after ACEi washout, Toprol XL and spiro. Started anticoagulation with Eliquis and loaded with IV amiodarone.  Underwent successful TEE guided DCCV on 11/07/20. He was stable following the procedure. Had planned to discharge home but he was kept for monitoring due to significant dizziness. Entresto switched to losartan, and amio reduced to 200 mg bid. 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 He was evaluated by Neurology and Neurosurgery. No acute intervention felt to be warranted. Plan for outpatient follow up. Discharge weight 172 lbs.  GDMT titrated at follow up.  S/p AFL ablation 12/08/20.-> recurrent AFL/AF -> s/p DCCV 12/21/20 -> AFL recurred  On 01/27/21 had AF and AFL ablation with Dr. Quentin Ore  Today he returns for HF follow up with interpreter. Says he is feeling much better. Dizziness completely resolved after stopping Entresto. Denies SOB, orthopnea or PND. No edema. Occasional heart fluttering. Had an episdoe yesterday that lasted about 30 mins. Can walk 1/2 mile without stopping.    Echo today  EF 30-35% RV ok. Personally reviewed    Cardiac Studies: - s/p DCCV 12/21/20 to SR.  - s/p AFL ablation 12/08/20  - Echo (7/22): EF of 20-25%, mild AV sclerosis with no stenosis, mild to moderate MR, trace TR and no pericardial effusion   - TEE/DCCV (11/07/20) successful restoration of SR.  - R/LHC (11/04/20): normal coronaries, normal LV filling pressures, normal right heart pressures, reduced CO.  Fick Cardiac Output 4.22 L/min  Fick Cardiac Output Index 2.26 (L/min)/BSA  RA A Wave 10 mmHg  RA V Wave 10 mmHg  RA Mean 7 mmHg  RV Systolic Pressure 27 mmHg  RV Diastolic Pressure 3 mmHg  RV EDP 5 mmHg  PA Systolic Pressure 27 mmHg  PA Diastolic Pressure 13 mmHg  PA Mean 21 mmHg  PW A Wave 19 mmHg  PW V Wave 25 mmHg  PW Mean 19 mmHg  LV Systolic Pressure 010 mmHg  LV Diastolic Pressure 6 mmHg  LV EDP 9 mmHg   Past Medical History:  Diagnosis Date   HTN (hypertension)    Current Outpatient Medications  Medication Sig Dispense Refill   amiodarone (PACERONE) 200 MG tablet Take 200 mg by mouth daily.     apixaban (ELIQUIS) 5 MG TABS tablet Take 1 tablet (5 mg total) by mouth 2 (two) times daily. 60 tablet 5   atorvastatin (LIPITOR) 40 MG tablet Take 1 tablet (40 mg total) by mouth daily at 6 PM. 90 tablet 3   FARXIGA 10 MG TABS tablet Take 10 mg by mouth daily.     losartan (COZAAR) 25 MG tablet Take 0.5 tablets (  12.5 mg total) by mouth at bedtime. 15 tablet 3   meclizine (ANTIVERT) 25 MG tablet Take 25 mg by mouth 3 (three) times daily as needed for dizziness.     metoprolol succinate (TOPROL-XL) 25 MG 24 hr tablet Take 25 mg by mouth daily.     pantoprazole (PROTONIX) 40 MG tablet Take 1 tablet (40 mg total) by mouth daily for 45 doses. 45 tablet 0   spironolactone (ALDACTONE) 25 MG tablet Take 0.5 tablets (12.5 mg total) by mouth at bedtime. 30 tablet 5   No current facility-administered medications for this encounter.   No Known Allergies  Social History    Socioeconomic History   Marital status: Married    Spouse name: Not on file   Number of children: Not on file   Years of education: Not on file   Highest education level: Not on file  Occupational History   Not on file  Tobacco Use   Smoking status: Never   Smokeless tobacco: Never  Substance and Sexual Activity   Alcohol use: Never   Drug use: Never   Sexual activity: Not on file  Other Topics Concern   Not on file  Social History Narrative   Not on file   Social Determinants of Health   Financial Resource Strain: Medium Risk   Difficulty of Paying Living Expenses: Somewhat hard  Food Insecurity: Food Insecurity Present   Worried About Running Out of Food in the Last Year: Sometimes true   Ran Out of Food in the Last Year: Sometimes true  Transportation Needs: No Transportation Needs   Lack of Transportation (Medical): No   Lack of Transportation (Non-Medical): No  Physical Activity: Not on file  Stress: Not on file  Social Connections: Not on file  Intimate Partner Violence: Not on file   Family History  Problem Relation Age of Onset   Asthma Father    COPD Father    BP 128/82   Pulse 65   Wt 79.8 kg (176 lb)   SpO2 97%   BMI 28.41 kg/m   Wt Readings from Last 3 Encounters:  02/03/21 79.8 kg (176 lb)  01/27/21 77.6 kg (171 lb)  01/10/21 77.5 kg (170 lb 12.8 oz)   PHYSICAL EXAM: General:  Well appearing. No resp difficulty HEENT: normal Neck: supple. no JVD. Carotids 2+ bilat; no bruits. No lymphadenopathy or thryomegaly appreciated. Cor: PMI nondisplaced. Regular rate & rhythm. No rubs, gallops or murmurs. Lungs: clear Abdomen: soft, nontender, nondistended. No hepatosplenomegaly. No bruits or masses. Good bowel sounds. Extremities: no cyanosis, clubbing, rash, edema Neuro: alert & orientedx3, cranial nerves grossly intact. moves all 4 extremities w/o difficulty. Affect pleasant  ECG: NSR 67 LVH with repol/diffuse TWI Personally  reviewed  ASSESSMENT & PLAN: 1. Chronic Systolic Heart Failure - Echo on 11/07/2020: LVEF 20-25% with normal left and right atrial size, mild aortic valve sclerosis with no stenosis, mild to moderate mitral regurgitation, trace tricuspid regurgitation and no pericardial effusion.  - L/RHC (11/05/20): No CAD  Normal LVEDP. 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 - S/p TEE guided cardioversion 11/07/20 & s/p AFL ablation 12/08/20, DCCV 12/21/20. - Echo today 02/03/21 EF 30-35% Personally reviewed - Continues to improve. NYHA I-II - Increase losartan to 25 mg qhs. (Did not tolerate Entreso due to dizziness). - Continue Farxiga 10 mg daily. - Continue Toprol XL 25 mg daily.  - Continue Spiro 12.5 mg daily. - Labs today. - Suspect  EF will continue to improve with maintenance of NSR. Will hold off on ICD evaluation for now (currently following with EP as well)  2. Atrial flutter, persistent - s/p TEE/DCCV on 11/07/20 & 12/21/20. - Moderate to severe LAE on TEE. - s/p AFL ablation 12/08/20. - s/p AFL/PVI on10/7/22 - CHA2DS2-VASc score is 3 - Remains in NSR today - Continue Eliquis 5 mg bid. - Continue Toprol XL 25 mg daily. - Follows with Dr. Quentin Ore  4. HLD - Per primary. Continue statin.   5. DM II - Per primary.A1c 6.5%. - On Farxiga.   6. HTN - Blood pressure has been running low. Now improved. Increase losartan as above.   7. Snoring - Sleep study ordered.   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 - Follow up with Neurosurgery.   Glori Bickers, MD 02/03/21

## 2021-02-06 ENCOUNTER — Encounter: Payer: Self-pay | Admitting: Internal Medicine

## 2021-02-06 ENCOUNTER — Other Ambulatory Visit: Payer: Self-pay

## 2021-02-06 ENCOUNTER — Ambulatory Visit (INDEPENDENT_AMBULATORY_CARE_PROVIDER_SITE_OTHER): Payer: Medicaid Other | Admitting: Internal Medicine

## 2021-02-06 VITALS — BP 120/74 | HR 74 | Ht 66.0 in | Wt 172.0 lb

## 2021-02-06 DIAGNOSIS — D352 Benign neoplasm of pituitary gland: Secondary | ICD-10-CM | POA: Diagnosis not present

## 2021-02-06 NOTE — Progress Notes (Signed)
Name: Hunter Miller  MRN/ DOB: [id number], Oct 03, 1960    Age/ Sex: 60 y.o., male    PCP: Gala Lewandowsky, MD   Reason for Endocrinology Evaluation: Pituitary Macroadenoma      Date of Initial Endocrinology Evaluation: 02/06/2021     HPI: [name] is a 61 y.o. male with a past medical history of HTN, Dyslipidemia and A. Flutter . 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   Pt follows with cardiology for A. Flutter and CHF. He is on Amiodarone    Per chart review, the pt has been noted with a pituitary adenoma on MRI 11/09/2020  Per pt has been diagnosed with this ~ 14 yrs ago and he declined surgery .   Denies headaches  Has fluctuating vision  Denies galactorrhea  Has decreased Libido for the past month  Denies spontaneous erections  Has occasional excessive sweating  Denies change in show size or ring size  His last eye exam was 3 months ago      HISTORY:  Past Medical History:  Past Medical History:  Diagnosis Date   HTN (hypertension)    Past Surgical History:  Past Surgical History:  Procedure Laterality Date   A-FLUTTER ABLATION N/A 12/08/2020   Procedure: A-FLUTTER ABLATION;  Surgeon: Vickie Epley, MD;  Location: [place] [facility];  Service: Cardiovascular;  Laterality: N/A;   ATRIAL FIBRILLATION ABLATION N/A 01/27/2021   Procedure: ATRIAL FIBRILLATION ABLATION;  Surgeon: Vickie Epley, MD;  Location: [place] [facility];  Service: Cardiovascular;  Laterality: N/A;   CARDIOVERSION N/A 11/07/2020   Procedure: CARDIOVERSION;  Surgeon: Jolaine Artist, MD;  Location: [facility] ENDOSCOPY;  Service: Cardiovascular;  Laterality: N/A;   CARDIOVERSION N/A 12/21/2020   Procedure: CARDIOVERSION;  Surgeon: Sueanne Margarita, MD;  Location: [facility] ENDOSCOPY;  Service: Cardiovascular;  Laterality: N/A;   RIGHT/LEFT HEART CATH AND CORONARY ANGIOGRAPHY N/A 11/04/2020    Procedure: RIGHT/LEFT HEART CATH AND CORONARY ANGIOGRAPHY;  Surgeon: Martinique, Peter M, MD;  Location: [place] [facility];  Service: Cardiovascular;  Laterality: N/A;   RIGHT/LEFT HEART CATH AND CORONARY/GRAFT ANGIOGRAPHY N/A 11/04/2020   Procedure: RIGHT/LEFT HEART CATH AND CORONARY/GRAFT ANGIOGRAPHY;  Surgeon: Martinique, Peter M, MD;  Location: [place] [facility];  Service: Cardiovascular;  Laterality: N/A;   TEE WITHOUT CARDIOVERSION N/A 11/07/2020   Procedure: TRANSESOPHAGEAL ECHOCARDIOGRAM (TEE);  Surgeon: Jolaine Artist, MD;  Location: [facility] ENDOSCOPY;  Service: Cardiovascular;  Laterality: N/A;    Social History:  reports that he has never smoked. He has never used smokeless tobacco. He reports that he does not drink alcohol and does not use drugs. Family History: family history includes Asthma in his father; COPD in his father.   HOME MEDICATIONS: Allergies as of 02/06/2021   No Known Allergies      Medication List        Accurate as of February 06, 2021  1:37 PM. If you have any questions, ask your nurse or doctor.          amiodarone 200 MG tablet Commonly known as: PACERONE Take 200 mg by mouth daily.   apixaban 5 MG Tabs tablet Commonly known as: ELIQUIS Take 1 tablet (5 mg total) by mouth 2 (two) times daily.   atorvastatin 40 MG tablet Commonly known as: LIPITOR Take 1 tablet (40 mg total) by mouth daily at 6 PM.   Farxiga 10 MG Tabs tablet Generic drug: dapagliflozin  propanediol Take 10 mg by mouth daily.   losartan 25 MG tablet Commonly known as: Cozaar Take 1 tablet (25 mg total) by mouth at bedtime.   meclizine 25 MG tablet Commonly known as: ANTIVERT Take 25 mg by mouth 3 (three) times daily as needed for dizziness.   metoprolol succinate 25 MG 24 hr tablet Commonly known as: TOPROL-XL Take 25 mg by mouth daily.   pantoprazole 40 MG tablet Commonly known as: Protonix Take 1 tablet (40 mg total) by mouth daily for 45 doses.   spironolactone 25 MG  tablet Commonly known as: ALDACTONE Take 0.5 tablets (12.5 mg total) by mouth at bedtime.          REVIEW OF SYSTEMS: A comprehensive ROS was conducted with the patient and is negative except as per HPI     OBJECTIVE:  VS: BP 120/74 (BP Location: Left Arm, Patient Position: Sitting, Cuff Size: Small)   Pulse 74   Ht 5\' 6"  (1.676 m)   Wt 172 lb (78 kg)   SpO2 99%   BMI 27.76 kg/m    Wt Readings from Last 3 Encounters:  02/06/21 172 lb (78 kg)  02/03/21 176 lb (79.8 kg)  01/27/21 171 lb (77.6 kg)     EXAM: General: Pt appears well and is in NAD Confrontational field testing abnormal on the left   Neck: General: Supple without adenopathy. Thyroid: Thyroid size normal.  No goiter or nodules appreciated.   Lungs: Clear with good BS bilat with no rales, rhonchi, or wheezes  Heart: Auscultation: RRR.  Abdomen: Normoactive bowel sounds, soft, nontender, without masses or organomegaly palpable  Extremities:  BL LE: No pretibial edema normal ROM and strength.  Skin: Hair: Texture and amount normal with gender appropriate distribution Skin Inspection: No rashes Skin Palpation: Skin temperature, texture, and thickness normal to palpation  Neuro:  DTRs: 2+ and symmetric in UE without delay in relaxation phase  Mental Status: Judgment, insight: Intact Orientation: Oriented to time, place, and person Mood and affect: No depression, anxiety, or agitation     DATA REVIEWED: Labs- pending   MRI 11/09/2020   Brain: Bilobed diffusely enhancing sellar and suprasellar mass indistinguishable from a normal pituitary gland. Craniocaudal span is 2.9 cm and there is prominent chiasmatic compression and up lifting. Transverse span measures up to 2.1 cm. Anterior to posterior span is 22 mm. No cavernous sinus region invasion.   No incidental infarct, hemorrhage, hydrocephalus, or collection.   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   ASSESSMENT/PLAN/RECOMMENDATIONS:   Pituitary Macroadenoma :   - Pt has no headaches , he will have visual field testing on 11/4th through his ophthalmologist  - Will proceed with fasting labs for prolactin, ACTH, cortisol, testosterone and IGF-1  - Per pt he was diagnosed with this ~ 14 yrs ago, at the time surgical intervention was offered but he declined. We discussed risk of blindness with chiasmic compression  - Will refer to neurosurgery- no acute symptoms at this time       F/U in 6 months or sooner pending labs results    Signed electronically by: Mack Guise, MD  [facility]  [facility] [address] [place], [place] [address] Phone: [phone] FAX: [phone]   CC: Gala Lewandowsky, MD [address] [address] Phone: [phone] Fax: [phone]   Return to Endocrinology clinic as below: Future Appointments  Date Time Provider [place]  02/07/2021  8:00 AM [facility] None  02/24/2021 11:00 AM Fenton, Clint R, PA [facility] None  04/26/2021  1:15 PM Vickie Epley, MD [facility]  05/09/2021  1:00 PM [place] ECHO OP 1 [facility]  05/09/2021  2:00 PM Bensimhon, Shaune Pascal, MD [facility] None  08/09/2021 10:50 AM Candelario Steppe, Melanie Crazier, MD [facility] None

## 2021-02-06 NOTE — Patient Instructions (Addendum)
-   Please return for fasting labs by 8 AM  - Please have your eye doctor perform a visual field test due to Pituitary macroadenoma     Regrese para los laboratorios en ayunas antes de las 8 a.m. - Solicite a su oftalmlogo que realice una prueba de Foley visual debido a un macroadenoma hipofisario

## 2021-02-07 ENCOUNTER — Other Ambulatory Visit (INDEPENDENT_AMBULATORY_CARE_PROVIDER_SITE_OTHER): Payer: Medicaid Other

## 2021-02-07 DIAGNOSIS — D352 Benign neoplasm of pituitary gland: Secondary | ICD-10-CM

## 2021-02-07 LAB — COMPREHENSIVE METABOLIC PANEL
ALT: 24 U/L (ref 0–53)
AST: 23 U/L (ref 0–37)
Albumin: 4.6 g/dL (ref 3.5–5.2)
Alkaline Phosphatase: 56 U/L (ref 39–117)
BUN: 29 mg/dL — ABNORMAL HIGH (ref 6–23)
CO2: 25 mEq/L (ref 19–32)
Calcium: 9.9 mg/dL (ref 8.4–10.5)
Chloride: 105 mEq/L (ref 96–112)
Creatinine, Ser: 1.3 mg/dL (ref 0.40–1.50)
GFR: 59.31 mL/min — ABNORMAL LOW (ref >60.00)
Glucose, Bld: 102 mg/dL — ABNORMAL HIGH (ref 70–99)
Potassium: 3.9 mEq/L (ref 3.5–5.1)
Sodium: 138 mEq/L (ref 135–145)
Total Bilirubin: 0.5 mg/dL (ref 0.2–1.2)
Total Protein: 7.6 g/dL (ref 6.0–8.3)

## 2021-02-07 LAB — TSH: TSH: 2.14 u[IU]/mL (ref 0.35–5.50)

## 2021-02-07 LAB — CORTISOL: Cortisol, Plasma: 15.6 ug/dL

## 2021-02-07 LAB — LUTEINIZING HORMONE: LH: 8.63 m[IU]/mL (ref 1.50–9.30)

## 2021-02-07 LAB — T4, FREE: Free T4: 0.9 ng/dL (ref 0.60–1.60)

## 2021-02-12 LAB — ACTH: C206 ACTH: 40 pg/mL (ref 6–50)

## 2021-02-12 LAB — INSULIN-LIKE GROWTH FACTOR
IGF-I, LC/MS: 112 ng/mL (ref 41–279)
Z-Score (Male): -0.3 SD (ref ?–2.0)

## 2021-02-12 LAB — TESTOSTERONE, TOTAL, LC/MS/MS: Testosterone, Total, LC-MS-MS: 264 ng/dL (ref 250–1100)

## 2021-02-12 LAB — PROLACTIN: Prolactin: 19.7 ng/mL — ABNORMAL HIGH (ref 2.0–18.0)

## 2021-02-24 ENCOUNTER — Ambulatory Visit (HOSPITAL_COMMUNITY)
Admission: RE | Admit: 2021-02-24 | Discharge: 2021-02-24 | Disposition: A | Discharge status: Home / Self Care | Payer: Medicaid Other | Source: Ambulatory Visit | Attending: Physician Assistant | Admitting: Physician Assistant

## 2021-02-24 ENCOUNTER — Other Ambulatory Visit: Payer: Self-pay

## 2021-02-24 ENCOUNTER — Encounter (HOSPITAL_COMMUNITY): Payer: Self-pay | Admitting: Physician Assistant

## 2021-02-24 VITALS — BP 136/94 | HR 54 | Ht 66.0 in | Wt 175.4 lb

## 2021-02-24 DIAGNOSIS — D6869 Other thrombophilia: Secondary | ICD-10-CM | POA: Diagnosis not present

## 2021-02-24 DIAGNOSIS — E119 Type 2 diabetes mellitus without complications: Secondary | ICD-10-CM | POA: Diagnosis not present

## 2021-02-24 DIAGNOSIS — I484 Atypical atrial flutter: Secondary | ICD-10-CM | POA: Diagnosis not present

## 2021-02-24 DIAGNOSIS — I480 Paroxysmal atrial fibrillation: Secondary | ICD-10-CM | POA: Diagnosis not present

## 2021-02-24 DIAGNOSIS — Z79899 Other long term (current) drug therapy: Secondary | ICD-10-CM | POA: Diagnosis not present

## 2021-02-24 DIAGNOSIS — Z7901 Long term (current) use of anticoagulants: Secondary | ICD-10-CM | POA: Insufficient documentation

## 2021-02-24 DIAGNOSIS — I483 Typical atrial flutter: Secondary | ICD-10-CM | POA: Diagnosis not present

## 2021-02-24 DIAGNOSIS — I110 Hypertensive heart disease with heart failure: Secondary | ICD-10-CM | POA: Insufficient documentation

## 2021-02-24 DIAGNOSIS — I5022 Chronic systolic (congestive) heart failure: Secondary | ICD-10-CM | POA: Insufficient documentation

## 2021-02-24 MED ORDER — LOSARTAN POTASSIUM 25 MG PO TABS: 25.0000 mg | ORAL_TABLET | Freq: Every day | ORAL | 0 refills | Status: DC

## 2021-02-24 NOTE — Progress Notes (Signed)
Primary Care Physician: Gala Lewandowsky, MD Primary Cardiologist: Dr Haroldine Laws  Primary Electrophysiologist: Dr Quentin Ore Referring Physician: Allena Katz NP   Hunter Miller is a 60 y.o. male with a history of HTN, DM, chronic systolic CHF, atrial flutter who presents for follow up in the [facility]. The patient was initially diagnosed with atrial flutter during a hospitalization 10/2020 for acute heart failure. He was started on amiodarone and underwent DCCV on 11/07/20. Patient is on Eliquis for a CHADS2VASC score of 3. He underwent atrial flutter ablation with Dr Quentin Ore on 12/08/20. He was also noted to have atypical atrial flutter at that time. Patient was seen in the [facility] 12/14/20 and was found to be back in atrial flutter. He does have symptoms of palpitations and dizziness. He underwent DCCV on 12/21/20 but reverted quickly to atrial flutter again. Seen by Dr Quentin Ore on 01/10/21 with plan for repeat ablation.   On follow up today, patient is s/p afib ablation on 01/27/21. He reports that he has felt improved since the ablation with no palpitations. He denies CP, swallowing pain, or groin issues. He denies any bleeding issues on anticoagulation.   Today, he denies symptoms of palpitations, chest pain, shortness of breath, orthopnea, PND, lower extremity edema, presyncope, syncope, snoring, daytime somnolence, bleeding, or neurologic sequela. The patient is tolerating medications without difficulties and is otherwise without complaint today.    Atrial Fibrillation Risk Factors:  he does not have symptoms or diagnosis of sleep apnea. he does not have a history of rheumatic fever.   he has a BMI of Body mass index is 28.31 kg/m.Marland Kitchen Filed Weights   02/24/21 1019  Weight: 79.6 kg     Family History  Problem Relation Age of Onset   Asthma Father    COPD Father      Atrial Fibrillation Management history:  Previous antiarrhythmic drugs: amiodarone   Previous cardioversions: 11/07/20, 12/21/20 Previous ablations: flutter 12/08/20, 01/27/21 CHADS2VASC score: 3 Anticoagulation history: Eliquis   Past Medical History:  Diagnosis Date   HTN (hypertension)    Past Surgical History:  Procedure Laterality Date   A-FLUTTER ABLATION N/A 12/08/2020   Procedure: A-FLUTTER ABLATION;  Surgeon: Vickie Epley, MD;  Location: [place] CV LAB;  Service: Cardiovascular;  Laterality: N/A;   ATRIAL FIBRILLATION ABLATION N/A 01/27/2021   Procedure: ATRIAL FIBRILLATION ABLATION;  Surgeon: Vickie Epley, MD;  Location: [place] [facility] LAB;  Service: Cardiovascular;  Laterality: N/A;   CARDIOVERSION N/A 11/07/2020   Procedure: CARDIOVERSION;  Surgeon: Jolaine Artist, MD;  Location: [facility] ENDOSCOPY;  Service: Cardiovascular;  Laterality: N/A;   CARDIOVERSION N/A 12/21/2020   Procedure: CARDIOVERSION;  Surgeon: Sueanne Margarita, MD;  Location: [facility] ENDOSCOPY;  Service: Cardiovascular;  Laterality: N/A;   RIGHT/LEFT HEART CATH AND CORONARY ANGIOGRAPHY N/A 11/04/2020   Procedure: RIGHT/LEFT HEART CATH AND CORONARY ANGIOGRAPHY;  Surgeon: Martinique, Peter M, MD;  Location: [place] CV LAB;  Service: Cardiovascular;  Laterality: N/A;   RIGHT/LEFT HEART CATH AND CORONARY/GRAFT ANGIOGRAPHY N/A 11/04/2020   Procedure: RIGHT/LEFT HEART CATH AND CORONARY/GRAFT ANGIOGRAPHY;  Surgeon: Martinique, Peter M, MD;  Location: [place] CV LAB;  Service: Cardiovascular;  Laterality: N/A;   TEE WITHOUT CARDIOVERSION N/A 11/07/2020   Procedure: TRANSESOPHAGEAL ECHOCARDIOGRAM (TEE);  Surgeon: Jolaine Artist, MD;  Location: [facility] ENDOSCOPY;  Service: Cardiovascular;  Laterality: N/A;    Current Outpatient Medications  Medication Sig Dispense Refill   amiodarone (PACERONE) 200 MG tablet Take 200 mg by mouth  daily.     apixaban (ELIQUIS) 5 MG TABS tablet Take 1 tablet (5 mg total) by mouth 2 (two) times daily. 60 tablet 5   atorvastatin (LIPITOR) 40 MG tablet Take 1 tablet (40  mg total) by mouth daily at 6 PM. 90 tablet 3   FARXIGA 10 MG TABS tablet Take 10 mg by mouth daily.     meclizine (ANTIVERT) 25 MG tablet Take 25 mg by mouth 3 (three) times daily as needed for dizziness.     metoprolol succinate (TOPROL-XL) 25 MG 24 hr tablet Take 25 mg by mouth daily.     pantoprazole (PROTONIX) 40 MG tablet Take 1 tablet (40 mg total) by mouth daily for 45 doses. 45 tablet 0   spironolactone (ALDACTONE) 25 MG tablet Take 0.5 tablets (12.5 mg total) by mouth at bedtime. 30 tablet 5   losartan (COZAAR) 25 MG tablet Take 1 tablet (25 mg total) by mouth daily. 90 tablet 0   No current facility-administered medications for this encounter.    No Known Allergies  Social History   Socioeconomic History   Marital status: Married    Spouse name: Not on file   Number of children: Not on file   Years of education: Not on file   Highest education level: Not on file  Occupational History   Not on file  Tobacco Use   Smoking status: Never   Smokeless tobacco: Never  Substance and Sexual Activity   Alcohol use: Never   Drug use: Never   Sexual activity: Not on file  Other Topics Concern   Not on file  Social History Narrative   Not on file   Social Determinants of Health   Financial Resource Strain: Medium Risk   Difficulty of Paying Living Expenses: Somewhat hard  Food Insecurity: Food Insecurity Present   Worried About Running Out of Food in the Last Year: Sometimes true   Ran Out of Food in the Last Year: Sometimes true  Transportation Needs: No Transportation Needs   Lack of Transportation (Medical): No   Lack of Transportation (Non-Medical): No  Physical Activity: Not on file  Stress: Not on file  Social Connections: Not on file  Intimate Partner Violence: Not on file     ROS- All systems are reviewed and negative except as per the HPI above.  Physical Exam: Vitals:   02/24/21 1019  BP: (!) 136/94  Pulse: (!) 54  Weight: 79.6 kg  Height: 5\' 6"   (1.676 m)    GEN- The patient is a well appearing male, alert and oriented x 3 today.   HEENT-head normocephalic, atraumatic, sclera clear, conjunctiva pink, hearing intact, trachea midline. Lungs- Clear to ausculation bilaterally, normal work of breathing Heart- Regular rate and rhythm, bradycardia, no murmurs, rubs or gallops  GI- soft, NT, ND, + BS Extremities- no clubbing, cyanosis, or edema MS- no significant deformity or atrophy Skin- no rash or lesion Psych- euthymic mood, full affect Neuro- strength and sensation are intact   Wt Readings from Last 3 Encounters:  02/24/21 79.6 kg  02/06/21 78 kg  02/03/21 79.8 kg    EKG today demonstrates  SB, 1st degree AV block, T wave changes (baseline) Vent. rate 54 BPM PR interval 246 ms QRS duration 114 ms QT/QTcB 466/441 ms   Echo 02/03/21 demonstrated   1. Left ventricular ejection fraction, by estimation, is 30 to 35%. The  left ventricle has moderately decreased function. The left ventricle  demonstrates global hypokinesis. Left ventricular diastolic parameters  are consistent with Grade II diastolic dysfunction (pseudonormalization). The average left ventricular global  longitudinal strain is -14.7 %. The global longitudinal strain is  abnormal.   2. Right ventricular systolic function is normal. The right ventricular  size is normal.   3. Left atrial size was mildly dilated.   4. The mitral valve is normal in structure. Trivial mitral valve  regurgitation. No evidence of mitral stenosis.   5. The aortic valve is tricuspid. Aortic valve regurgitation is not  visualized. No aortic stenosis is present.   6. Aortic dilatation noted. There is borderline dilatation of the aortic  root, measuring 39 mm.   7. The inferior vena cava is normal in size with greater than 50%  respiratory variability, suggesting right atrial pressure of 3 mmHg.   8. There is a small patent foramen ovale.  Epic records are reviewed at length  today  CHA2DS2-VASc Score = 3  The patient's score is based upon: CHF History: 1 HTN History: 1 Diabetes History: 1 Stroke History: 0 Vascular Disease History: 0 Age Score: 0 Gender Score: 0     ASSESSMENT AND PLAN: 1. Atrial flutter, typical and atypical/paroxysmal atrial fibrillation The patient's CHA2DS2-VASc score is 3, indicating a 3.2% annual risk of stroke.   S/p afib ablation 01/27/21 Patient appears to be maintaining SR. Continue amiodarone 200 mg daily for now Continue metoprolol 25 mg daily Continue Eliquis 5 mg BID with no missed doses for 3 months post ablation.   2. Secondary Hypercoagulable State (ICD10:  D68.69) The patient is at significant risk for stroke/thromboembolism based upon his CHA2DS2-VASc Score of 3.  Continue Apixaban (Eliquis).   3. Chronic systolic CHF Suspected tachycardia mediated. No signs or symptoms of fluid overload today. [facility]ed in the [facility]  4. HTN Stable, no changes today.   Follow up with Dr Quentin Ore as scheduled.    [facility] [address] [place],  [address] [phone] 02/24/2021 10:41 AM

## 2021-03-29 ENCOUNTER — Other Ambulatory Visit (HOSPITAL_COMMUNITY): Payer: Self-pay

## 2021-03-29 MED ORDER — AMIODARONE HCL 200 MG PO TABS: 200.0000 mg | ORAL_TABLET | Freq: Every day | ORAL | 0 refills | Status: DC

## 2021-04-25 ENCOUNTER — Other Ambulatory Visit (HOSPITAL_COMMUNITY): Payer: Self-pay

## 2021-04-25 DIAGNOSIS — I5022 Chronic systolic (congestive) heart failure: Secondary | ICD-10-CM

## 2021-04-25 NOTE — Progress Notes (Signed)
Orders Placed This Encounter  Procedures   ECHOCARDIOGRAM COMPLETE    Standing Status:   Future    Standing Expiration Date:   04/25/2022    Order Specific Question:   Where should this test be performed    Answer:   [place]    Order Specific Question:   Perflutren DEFINITY (image enhancing agent) should be administered unless hypersensitivity or allergy exist    Answer:   Administer Perflutren    Order Specific Question:   Reason for exam-Echo    Answer:   Congestive Heart Failure  I50.9    Order Specific Question:   Release to patient    Answer:   Immediate

## 2021-04-26 ENCOUNTER — Ambulatory Visit (INDEPENDENT_AMBULATORY_CARE_PROVIDER_SITE_OTHER): Payer: Medicaid Other | Admitting: Cardiology

## 2021-04-26 ENCOUNTER — Other Ambulatory Visit: Payer: Self-pay

## 2021-04-26 ENCOUNTER — Ambulatory Visit: Payer: Medicaid Other | Admitting: Cardiology

## 2021-04-26 ENCOUNTER — Encounter: Payer: Self-pay | Admitting: Cardiology

## 2021-04-26 VITALS — BP 120/82 | HR 61 | Ht 66.0 in | Wt 182.0 lb

## 2021-04-26 DIAGNOSIS — I5022 Chronic systolic (congestive) heart failure: Secondary | ICD-10-CM

## 2021-04-26 DIAGNOSIS — I484 Atypical atrial flutter: Secondary | ICD-10-CM | POA: Diagnosis not present

## 2021-04-26 DIAGNOSIS — I4819 Other persistent atrial fibrillation: Secondary | ICD-10-CM

## 2021-04-26 DIAGNOSIS — D352 Benign neoplasm of pituitary gland: Secondary | ICD-10-CM

## 2021-04-26 MED ORDER — AMIODARONE HCL 200 MG PO TABS: 200.0000 mg | ORAL_TABLET | Freq: Every day | ORAL | 0 refills | Status: DC

## 2021-04-26 MED ORDER — APIXABAN 5 MG PO TABS: 5.0000 mg | ORAL_TABLET | Freq: Two times a day (BID) | ORAL | 5 refills | Status: DC

## 2021-04-26 MED ORDER — METOPROLOL SUCCINATE ER 25 MG PO TB24: 25.0000 mg | ORAL_TABLET | Freq: Every day | ORAL | 3 refills | Status: DC

## 2021-04-26 NOTE — Progress Notes (Signed)
Electrophysiology Office Follow up Visit Note:    Date:  04/26/2021   ID:  Hunter Miller, DOB November 10, 1960, MRN [id number]  PCP:  Gala Lewandowsky, MD  [facility] Cardiologist:  None  [facility] Electrophysiologist:  Vickie Epley, MD    Interval History:    Hunter Miller is a 61 y.o. male who presents for a follow up visit.  He underwent a successful A. fib ablation for persistent atrial fibrillation and atypical flutter on January 27, 2021.  During the procedure, the posterior wall and pulmonary veins were isolated.  Since the procedure he has had no recurrence of arrhythmia and feels well.  Groin sites healed well.  Spanish interpreter was used during today's visit. He tells me that he starts each day with about a 20 to 30-minute walk.      Past Medical History:  Diagnosis Date   HTN (hypertension)     Past Surgical History:  Procedure Laterality Date   A-FLUTTER ABLATION N/A 12/08/2020   Procedure: A-FLUTTER ABLATION;  Surgeon: Vickie Epley, MD;  Location: [place] [facility] LAB;  Service: Cardiovascular;  Laterality: N/A;   ATRIAL FIBRILLATION ABLATION N/A 01/27/2021   Procedure: ATRIAL FIBRILLATION ABLATION;  Surgeon: Vickie Epley, MD;  Location: [place] [facility] LAB;  Service: Cardiovascular;  Laterality: N/A;   CARDIOVERSION N/A 11/07/2020   Procedure: CARDIOVERSION;  Surgeon: Jolaine Artist, MD;  Location: [facility] ENDOSCOPY;  Service: Cardiovascular;  Laterality: N/A;   CARDIOVERSION N/A 12/21/2020   Procedure: CARDIOVERSION;  Surgeon: Sueanne Margarita, MD;  Location: [facility] ENDOSCOPY;  Service: Cardiovascular;  Laterality: N/A;   RIGHT/LEFT HEART CATH AND CORONARY ANGIOGRAPHY N/A 11/04/2020   Procedure: RIGHT/LEFT HEART CATH AND CORONARY ANGIOGRAPHY;  Surgeon: Martinique, Peter M, MD;  Location: [place] CV LAB;  Service: Cardiovascular;  Laterality: N/A;   RIGHT/LEFT HEART CATH AND CORONARY/GRAFT ANGIOGRAPHY N/A 11/04/2020   Procedure: RIGHT/LEFT HEART CATH AND  CORONARY/GRAFT ANGIOGRAPHY;  Surgeon: Martinique, Peter M, MD;  Location: [place] CV LAB;  Service: Cardiovascular;  Laterality: N/A;   TEE WITHOUT CARDIOVERSION N/A 11/07/2020   Procedure: TRANSESOPHAGEAL ECHOCARDIOGRAM (TEE);  Surgeon: Jolaine Artist, MD;  Location: [facility] ENDOSCOPY;  Service: Cardiovascular;  Laterality: N/A;    Current Medications: Current Meds  Medication Sig   amiodarone (PACERONE) 200 MG tablet Take 1 tablet (200 mg total) by mouth daily.   apixaban (ELIQUIS) 5 MG TABS tablet Take 1 tablet (5 mg total) by mouth 2 (two) times daily.   atorvastatin (LIPITOR) 40 MG tablet Take 1 tablet (40 mg total) by mouth daily at 6 PM.   FARXIGA 10 MG TABS tablet Take 10 mg by mouth daily.   losartan (COZAAR) 25 MG tablet Take 1 tablet (25 mg total) by mouth daily.   meclizine (ANTIVERT) 25 MG tablet Take 25 mg by mouth 3 (three) times daily as needed for dizziness.   metoprolol succinate (TOPROL-XL) 25 MG 24 hr tablet Take 25 mg by mouth daily.   spironolactone (ALDACTONE) 25 MG tablet Take 0.5 tablets (12.5 mg total) by mouth at bedtime.     Allergies:   Patient has no known allergies.   Social History   Socioeconomic History   Marital status: Married    Spouse name: Not on file   Number of children: Not on file   Years of education: Not on file   Highest education level: Not on file  Occupational History   Not on file  Tobacco Use   Smoking status: Never  Smokeless tobacco: Never  Substance and Sexual Activity   Alcohol use: Never   Drug use: Never   Sexual activity: Not on file  Other Topics Concern   Not on file  Social History Narrative   Not on file   Social Determinants of Health   Financial Resource Strain: Medium Risk   Difficulty of Paying Living Expenses: Somewhat hard  Food Insecurity: Food Insecurity Present   Worried About Running Out of Food in the Last Year: Sometimes true   Ran Out of Food in the Last Year: Sometimes true  Transportation  Needs: No Transportation Needs   Lack of Transportation (Medical): No   Lack of Transportation (Non-Medical): No  Physical Activity: Not on file  Stress: Not on file  Social Connections: Not on file     Family History: The patient's family history includes Asthma in his father; COPD in his father.  ROS:   Please see the history of present illness.    All other systems reviewed and are negative.  EKGs/Labs/Other Studies Reviewed:    The following studies were reviewed today:    EKG:  The ekg ordered today demonstrates sinus rhythm.  Chronic T wave abnormality in the lateral precordium.  Recent Labs: 11/04/2020: Magnesium 2.1 01/10/2021: Hemoglobin 13.2; Platelets 230 02/07/2021: ALT 24; BUN 29; Creatinine, Ser 1.30; Potassium 3.9; Sodium 138; TSH 2.14  Recent Lipid Panel    Component Value Date/Time   CHOL 178 01/02/2021 0959   TRIG 82 01/02/2021 0959   HDL 64 01/02/2021 0959   CHOLHDL 2.8 01/02/2021 0959   VLDL 16 01/02/2021 0959   LDLCALC 98 01/02/2021 0959   LDLDIRECT 171.4 (H) 11/09/2020 1855    Physical Exam:    VS:  BP 120/82    Pulse 61    Ht 5\' 6"  (1.676 m)    Wt 182 lb (82.6 kg)    SpO2 97%    BMI 29.38 kg/m     Wt Readings from Last 3 Encounters:  04/26/21 182 lb (82.6 kg)  02/24/21 175 lb 6.4 oz (79.6 kg)  02/06/21 172 lb (78 kg)     GEN:  Well nourished, well developed in no acute distress HEENT: Normal NECK: No JVD; No carotid bruits LYMPHATICS: No lymphadenopathy CARDIAC: RRR, no murmurs, rubs, gallops RESPIRATORY:  Clear to auscultation without rales, wheezing or rhonchi  ABDOMEN: Soft, non-tender, non-distended MUSCULOSKELETAL:  No edema; No deformity  SKIN: Warm and dry NEUROLOGIC:  Alert and oriented x 3 PSYCHIATRIC:  Normal affect        ASSESSMENT:    1. Persistent atrial fibrillation ([address])   2. Atypical atrial flutter (HCC)   3. 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 Is on amiodarone 200 mg by mouth once daily. On Eliquis 5 mg by mouth twice daily for stroke prophylaxis. CMP and TSH checked in October 2022 and were within normal limits.  Would plan to continue Eliquis uninterrupted for 3 months after his ablation.  At that point, if he needs to have a surgical procedure, okay to hold anticoagulation for 2 to 3 days before the procedure and restart when felt safe from a surgical perspective.  I would plan on having him on chronic anticoagulation given his elevated CHA2DS2-VASc.  #Chronic systolic heart failure NYHA class II today.  Warm and  dry on exam.  Continue current medical therapy. He has a repeat echo scheduled for May 09, 2020.  His prior echo in October showed an ejection fraction of 30 to 35%.  If the repeat echo shows a persistently reduced left ventricular function, we will need to discuss his risk of sudden cardiac death and the potential need for an ICD.  It looks like he is scheduled to see Dr. Haroldine Laws January 17 as well who can also weigh in on this.  #Pituitary macroadenoma Today he reports some visual disturbances.  He tells me he has a appointment scheduled with the neurosurgery team.  Anticoagulation management as above.    Medication Adjustments/Labs and Tests Ordered: Current medicines are reviewed at length with the patient today.  Concerns regarding medicines are outlined above.  No orders of the defined types were placed in this encounter.  No orders of the defined types were placed in this encounter.    Signed, Lars Mage, MD, [facility], [facility] 04/26/2021 8:54 PM    Electrophysiology [facility]

## 2021-04-26 NOTE — Patient Instructions (Addendum)
Medication Instructions:  Your physician recommends that you continue on your current medications as directed. Please refer to the Current Medication list given to you today. *If you need a refill on your cardiac medications before your next appointment, please call your pharmacy*  Lab Work: None. If you have labs (blood work) drawn today and your tests are completely normal, you will receive your results only by: [place] (if you have MyChart) OR A paper copy in the mail If you have any lab test that is abnormal or we need to change your treatment, we will call you to review the results.  Testing/Procedures: None.  Follow-Up: At [facility], you and your health needs are our priority.  As part of our continuing mission to provide you with exceptional heart care, we have created designated Provider Care Teams.  These Care Teams include your primary Cardiologist (physician) and Advanced Practice Providers (APPs -  Physician Assistants and Nurse Practitioners) who all work together to provide you with the care you need, when you need it.  Your physician wants you to follow-up in: 4 months with Lars Mage in [place]- APP   We recommend signing up for the patient portal called "MyChart".  Sign up information is provided on this After Visit Summary.  MyChart is used to connect with patients for Virtual Visits (Telemedicine).  Patients are able to view lab/test results, encounter notes, upcoming appointments, etc.  Non-urgent messages can be sent to your provider as well.   To learn more about what you can do with MyChart, go to [email or web address].    Any Other Special Instructions Will Be Listed Below (If Applicable).

## 2021-05-09 ENCOUNTER — Ambulatory Visit (HOSPITAL_COMMUNITY)
Admission: RE | Admit: 2021-05-09 | Discharge: 2021-05-09 | Disposition: A | Discharge status: Home / Self Care | Payer: Medicaid Other | Source: Ambulatory Visit | Attending: Family Medicine | Admitting: Family Medicine

## 2021-05-09 ENCOUNTER — Ambulatory Visit (HOSPITAL_BASED_OUTPATIENT_CLINIC_OR_DEPARTMENT_OTHER)
Admission: RE | Admit: 2021-05-09 | Discharge: 2021-05-09 | Disposition: A | Discharge status: Home / Self Care | Payer: Medicaid Other | Source: Ambulatory Visit | Attending: Internal Medicine | Admitting: Internal Medicine

## 2021-05-09 ENCOUNTER — Other Ambulatory Visit: Payer: Self-pay

## 2021-05-09 ENCOUNTER — Encounter (HOSPITAL_COMMUNITY): Payer: Self-pay | Admitting: Internal Medicine

## 2021-05-09 VITALS — BP 124/90 | HR 63 | Wt 181.2 lb

## 2021-05-09 DIAGNOSIS — I5022 Chronic systolic (congestive) heart failure: Secondary | ICD-10-CM

## 2021-05-09 DIAGNOSIS — Z7901 Long term (current) use of anticoagulants: Secondary | ICD-10-CM | POA: Diagnosis not present

## 2021-05-09 DIAGNOSIS — E119 Type 2 diabetes mellitus without complications: Secondary | ICD-10-CM | POA: Insufficient documentation

## 2021-05-09 DIAGNOSIS — I4819 Other persistent atrial fibrillation: Secondary | ICD-10-CM | POA: Diagnosis not present

## 2021-05-09 DIAGNOSIS — D352 Benign neoplasm of pituitary gland: Secondary | ICD-10-CM | POA: Insufficient documentation

## 2021-05-09 DIAGNOSIS — E785 Hyperlipidemia, unspecified: Secondary | ICD-10-CM | POA: Diagnosis not present

## 2021-05-09 DIAGNOSIS — I428 Other cardiomyopathies: Secondary | ICD-10-CM | POA: Insufficient documentation

## 2021-05-09 DIAGNOSIS — I4892 Unspecified atrial flutter: Secondary | ICD-10-CM | POA: Insufficient documentation

## 2021-05-09 DIAGNOSIS — Z7984 Long term (current) use of oral hypoglycemic drugs: Secondary | ICD-10-CM | POA: Insufficient documentation

## 2021-05-09 DIAGNOSIS — I110 Hypertensive heart disease with heart failure: Secondary | ICD-10-CM | POA: Insufficient documentation

## 2021-05-09 DIAGNOSIS — R0602 Shortness of breath: Secondary | ICD-10-CM | POA: Diagnosis present

## 2021-05-09 DIAGNOSIS — I4891 Unspecified atrial fibrillation: Secondary | ICD-10-CM | POA: Diagnosis not present

## 2021-05-09 DIAGNOSIS — Z79899 Other long term (current) drug therapy: Secondary | ICD-10-CM | POA: Diagnosis not present

## 2021-05-09 LAB — ECHOCARDIOGRAM COMPLETE
AR max vel: 3.71 cm2
AV Area VTI: 3.36 cm2
AV Area mean vel: 3.22 cm2
AV Mean grad: 2 mmHg
AV Peak grad: 3.3 mmHg
Ao pk vel: 0.91 m/s
Area-P 1/2: 3.01 cm2
S' Lateral: 3.6 cm

## 2021-05-09 LAB — COMPREHENSIVE METABOLIC PANEL
ALT: 33 U/L (ref 0–44)
AST: 30 U/L (ref 15–41)
Albumin: 4.6 g/dL (ref 3.5–5.0)
Alkaline Phosphatase: 48 U/L (ref 38–126)
Anion gap: 8 (ref 5–15)
BUN: 25 mg/dL — ABNORMAL HIGH (ref 8–23)
CO2: 25 mmol/L (ref 22–32)
Calcium: 9.8 mg/dL (ref 8.9–10.3)
Chloride: 105 mmol/L (ref 98–111)
Creatinine, Ser: 1.28 mg/dL — ABNORMAL HIGH (ref 0.61–1.24)
GFR, Estimated: >60 mL/min (ref >60)
Glucose, Bld: 90 mg/dL (ref 70–99)
Potassium: 4.4 mmol/L (ref 3.5–5.1)
Sodium: 138 mmol/L (ref 135–145)
Total Bilirubin: 0.6 mg/dL (ref 0.3–1.2)
Total Protein: 9 g/dL — ABNORMAL HIGH (ref 6.5–8.1)

## 2021-05-09 LAB — TSH: TSH: 1.025 u[IU]/mL (ref 0.350–4.500)

## 2021-05-09 LAB — T4, FREE: Free T4: 1.12 ng/dL (ref 0.61–1.12)

## 2021-05-09 MED ORDER — AMIODARONE HCL 200 MG PO TABS: 100.0000 mg | ORAL_TABLET | Freq: Every day | ORAL | 0 refills | Status: DC

## 2021-05-09 NOTE — Patient Instructions (Signed)
Cambios de medicamentos:  Scientist, research (life sciences) a 100 mg diarios  Trabajo de laboratorio:  Laboratorios realizados hoy, sus resultados estarn disponibles en MyChart, nos pondremos en contacto con usted para lecturas anormales.   Pruebas/Procedimientos:  ninguno  Referencias:  ninguno  Instrucciones Especiales // Educacin:  ninguno  Seguimiento en: 6 meses (julio de 2023) ** Llame a la oficina en junio para una cita **  En la [place] de Insuficiencia Cardaca [place], usted y sus necesidades de salud son Cleotis Nipper prioridad. Contamos con un equipo Arboriculturist en el tratamiento de la [place]. Este equipo de atencin incluye a su cardilogo principal especializado en insuficiencia cardaca (mdico), proveedores de Financial planner (APP, asistentes mdicos y Ambulance person) y farmacuticos, quienes trabajan juntos para brindarle la atencin que necesita, cuando la necesita.  Puede ver a cualquiera de los siguientes proveedores en su equipo de atencin designado en su prximo seguimiento:   Dr. Quillian Quince [place]  Dr. Loralie Champagne  Amy Ninfa Meeker, NP  Imagene Gurney, [place]  Allena Katz, NP  Marlyce Huge, [place]  Audry Riles, Doctora en [place] de traer todas las botellas de sus medicamentos a cada cita.  Necesita contactarnos:  Si tiene [place] pregunta o inquietud antes de su prxima cita, envenos un mensaje a travs de mychart o llame a nuestra oficina al [phone].  PARA DEJAR UN MENSAJE PARA LA ENFERMERA SELECCIONE LA OPCIN 2, POR FAVOR DEJE UN MENSAJE QUE INCLUYA:  TU NOMBRE  FECHA DE NACIMIENTO  NMERO DE DEVOLUCIN DE LLAMADA  MOTIVO DE LA LLAMADA**esto es importante ya que damos prioridad a las devoluciones de llamadas  RECIBIR UNA LLAMADA EL MISMO DA SIEMPRE QUE LLAME ANTES DE LAS 4:00 PM

## 2021-05-09 NOTE — Progress Notes (Signed)
°  Echocardiogram 2D Echocardiogram has been performed.  Hunter Miller 05/09/2021, 1:48 PM

## 2021-05-09 NOTE — Progress Notes (Signed)
Advanced Heart Failure Clinic Note    PCP: Gala Lewandowsky, MD HF Cardiologist: Dr. Haroldine Laws  HPI: [name] is a 61 y.o. male with HTN, HLD, atrial flutter s/p ablation 97/02 and systolic heart failure.   Presented to [facility] with chest pain in 7/22. Echo EF 20-25%, mild AV sclerosis with no stenosis, mild to moderate MR, trace TR and no pericardial effusion. Also found to be in atrial flutter with RVR.    Transferred to [facility]. R/LHC 11/04/20  normal cors, normal filling pressures, CI 2.26.  Underwent TEE DCCV. He was stable following the procedure. 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 He was evaluated by Neurology and Neurosurgery. No acute intervention felt to be warranted. Plan for outpatient follow up. Discharge weight 172 lbs.  GDMT titrated at follow up.  S/p AFL ablation 12/08/20.-> recurrent AFL/AF -> s/p DCCV 12/21/20 -> AFL recurred  On 01/27/21 had AF and AFL ablation with Dr. Quentin Ore  Today he returns for HF follow up with interpreter. Feels great. Only feels SOB when he runs. No CP, orthopnea or PND. No palpitations.   Echo today 05/09/21 EF 55-60% RV ok Personally reviewed   Echo 10/22 EF 30-35% RV ok.     Cardiac Studies: - s/p DCCV 12/21/20 to SR.  - s/p AFL ablation 12/08/20  - Echo (7/22): EF of 20-25%, mild AV sclerosis with no stenosis, mild to moderate MR, trace TR and no pericardial effusion   - TEE/DCCV (11/07/20) successful restoration of SR.  - R/LHC (11/04/20): normal coronaries, normal LV filling pressures, normal right heart pressures, reduced CO.  Fick Cardiac Output 4.22 L/min  Fick Cardiac Output Index 2.26 (L/min)/BSA  RA A Wave 10 mmHg  RA V Wave 10 mmHg  RA Mean 7 mmHg  RV Systolic Pressure 27 mmHg  RV Diastolic Pressure 3 mmHg  RV EDP 5 mmHg  PA Systolic Pressure 27 mmHg  PA Diastolic Pressure 13 mmHg  PA Mean 21  mmHg  PW A Wave 19 mmHg  PW V Wave 25 mmHg  PW Mean 19 mmHg  LV Systolic Pressure 637 mmHg  LV Diastolic Pressure 6 mmHg  LV EDP 9 mmHg   Past Medical History:  Diagnosis Date   HTN (hypertension)    Current Outpatient Medications  Medication Sig Dispense Refill   amiodarone (PACERONE) 200 MG tablet Take 1 tablet (200 mg total) by mouth daily. 60 tablet 0   apixaban (ELIQUIS) 5 MG TABS tablet Take 1 tablet (5 mg total) by mouth 2 (two) times daily. 60 tablet 5   atorvastatin (LIPITOR) 40 MG tablet Take 1 tablet (40 mg total) by mouth daily at 6 PM. 90 tablet 3   FARXIGA 10 MG TABS tablet Take 10 mg by mouth daily.     losartan (COZAAR) 25 MG tablet Take 1 tablet (25 mg total) by mouth daily. 90 tablet 0   meclizine (ANTIVERT) 25 MG tablet Take 25 mg by mouth 3 (three) times daily as needed for dizziness.     metoprolol succinate (TOPROL-XL) 25 MG 24 hr tablet Take 1 tablet (25 mg total) by mouth daily. 90 tablet 3   pantoprazole (PROTONIX) 40 MG tablet Take 1 tablet (40 mg total) by mouth daily for 45 doses. 45 tablet 0   spironolactone (ALDACTONE) 25 MG tablet Take 0.5 tablets (12.5 mg total) by mouth at bedtime. 30 tablet 5   No current facility-administered  medications for this encounter.   No Known Allergies  Social History   Socioeconomic History   Marital status: Married    Spouse name: Not on file   Number of children: Not on file   Years of education: Not on file   Highest education level: Not on file  Occupational History   Not on file  Tobacco Use   Smoking status: Never   Smokeless tobacco: Never  Substance and Sexual Activity   Alcohol use: Never   Drug use: Never   Sexual activity: Not on file  Other Topics Concern   Not on file  Social History Narrative   Not on file   Social Determinants of Health   Financial Resource Strain: Medium Risk   Difficulty of Paying Living Expenses: Somewhat hard  Food Insecurity: Food Insecurity Present   Worried About  Running Out of Food in the Last Year: Sometimes true   Ran Out of Food in the Last Year: Sometimes true  Transportation Needs: No Transportation Needs   Lack of Transportation (Medical): No   Lack of Transportation (Non-Medical): No  Physical Activity: Not on file  Stress: Not on file  Social Connections: Not on file  Intimate Partner Violence: Not on file   Family History  Problem Relation Age of Onset   Asthma Father    COPD Father    BP 124/90    Pulse 63    Wt 82.2 kg (181 lb 3.2 oz)    SpO2 96%    BMI 29.25 kg/m   Wt Readings from Last 3 Encounters:  05/09/21 82.2 kg (181 lb 3.2 oz)  04/26/21 82.6 kg (182 lb)  02/24/21 79.6 kg (175 lb 6.4 oz)   PHYSICAL EXAM: General:  Well appearing. No resp difficulty HEENT: normal Neck: supple. no JVD. Carotids 2+ bilat; no bruits. No lymphadenopathy or thryomegaly appreciated. Cor: PMI nondisplaced. Regular rate & rhythm. No rubs, gallops or murmurs. Lungs: clear Abdomen: soft, nontender, nondistended. No hepatosplenomegaly. No bruits or masses. Good bowel sounds. Extremities: no cyanosis, clubbing, rash, edema Neuro: alert & orientedx3, cranial nerves grossly intact. moves all 4 extremities w/o difficulty. Affect pleasant   ASSESSMENT & PLAN: 1. Chronic Systolic Heart Failure - Echo on 11/07/2020: LVEF 20-25% with normal left and right atrial size, mild aortic valve sclerosis with no stenosis, mild to moderate mitral regurgitation, trace tricuspid regurgitation and no pericardial effusion.  - L/RHC (11/05/20): No CAD  Normal LVEDP. 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 - S/p TEE guided cardioversion 11/07/20 & s/p AFL ablation 12/08/20, DCCV 12/21/20. - Echo 02/03/21 EF 30-35% - Echo today EF 55-60% -> normalized with maintenance of NSR - Continue losartan 25 mg qhs. (Did not tolerate Entreso due to dizziness). - Continue Farxiga 10 mg daily. - Continue Toprol XL 25 mg daily.  - Continue Spiro  12.5 mg daily. - Labs today   2. Atrial flutter, persistent - s/p TEE/DCCV on 11/07/20 & 12/21/20. - Moderate to severe LAE on TEE. - s/p AFL ablation 12/08/20. - s/p AFL/PVI on10/7/22 - CHA2DS2-VASc score is 3 - Remains in NSR today. Still on amio 200 daily. - Continue Eliquis 5 mg bid. - Continue Toprol XL 25 mg daily. - I will drop amio to 100 daily - Follows with Dr. Quentin Ore. In 08/05/21 -> can stop amio at that point if he agrees   4. HLD - Per primary. Continue statin.   5. DM II - Per primary.A1c  6.5%. - On Farxiga.   6. Snoring - Sleep study ordered.   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 - Follow up with Neurosurgery. - He is cleared to proceed with surgery from a cardiac standpoint. Can hold Eliquis as needed.    Glori Bickers, MD 05/09/21

## 2021-08-09 ENCOUNTER — Telehealth: Payer: Self-pay | Admitting: Internal Medicine

## 2021-08-09 ENCOUNTER — Ambulatory Visit (INDEPENDENT_AMBULATORY_CARE_PROVIDER_SITE_OTHER): Payer: Medicaid Other | Admitting: Internal Medicine

## 2021-08-09 ENCOUNTER — Encounter: Payer: Self-pay | Admitting: Internal Medicine

## 2021-08-09 VITALS — BP 120/82 | HR 68 | Ht 66.0 in | Wt 175.2 lb

## 2021-08-09 DIAGNOSIS — D352 Benign neoplasm of pituitary gland: Secondary | ICD-10-CM | POA: Diagnosis not present

## 2021-08-09 DIAGNOSIS — H539 Unspecified visual disturbance: Secondary | ICD-10-CM

## 2021-08-09 LAB — BASIC METABOLIC PANEL
BUN: 22 mg/dL (ref 6–23)
CO2: 26 mEq/L (ref 19–32)
Calcium: 9.2 mg/dL (ref 8.4–10.5)
Chloride: 106 mEq/L (ref 96–112)
Creatinine, Ser: 1.1 mg/dL (ref 0.40–1.50)
GFR: 72.22 mL/min (ref >60.00)
Glucose, Bld: 88 mg/dL (ref 70–99)
Potassium: 4.3 mEq/L (ref 3.5–5.1)
Sodium: 139 mEq/L (ref 135–145)

## 2021-08-09 LAB — T4, FREE: Free T4: 0.94 ng/dL (ref 0.60–1.60)

## 2021-08-09 LAB — CORTISOL: Cortisol, Plasma: 9.7 ug/dL

## 2021-08-09 LAB — TSH: TSH: 1.17 u[IU]/mL (ref 0.35–5.50)

## 2021-08-09 NOTE — Telephone Encounter (Signed)
Patient was scheduled in September but no showed the appointment.  New referral sent. ?

## 2021-08-09 NOTE — Progress Notes (Signed)
? ? ?Name: Hunter Miller  ?MRN/ DOB: [id number], November 22, 1960    ?Age/ Sex: 61 y.o., male   ? ?PCP: Gala Lewandowsky, MD   ?Reason for Endocrinology Evaluation: Pituitary Macroadenoma   ?   ?Date of Initial Endocrinology Evaluation: 02/06/2021  ? ? ?HPI: ?Hunter Miller is a 61 y.o. male with a past medical history of HTN, Dyslipidemia and A. Flutter and CHF. 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  ? ?Pt follows with cardiology for A. Flutter and CHF. He is on Amiodarone  ? ? ?Per chart review, the pt has been noted with a pituitary adenoma on MRI 11/09/2020 ? ?Per pt has been diagnosed with this ~ 14 yrs ago and he declined surgery .  ? ?On his initial visit to our clinic he was noted with slightly elevated prolactin this was attributed to stalk effect.  ACTH, cortisol, LH, testosterone, and TFT have all come back normal ? ?He was referred to neurosurgery ? ?SUBJECTIVE:  ? ? ?Today (08/09/21): Patient is here for follow-up on pituitary macroadenoma. ? ? ? ?He started having headaches ~ 1 week ago  ?He continues with vision changes, per pt he had a visual field testing 02/2021 which was abnormal.  ?Denies galactorrhea   ?Denies excessive sweating  ?Denies change in show size or ring size  ?Denies ED  ? ? ? ?Patient was seen by cardiology for a follow-up on A-fib, S/P ablation 04/2021 ? ? ?HISTORY:  ?Past Medical History:  ?Past Medical History:  ?Diagnosis Date  ? HTN (hypertension)   ? ?Past Surgical History:  ?Past Surgical History:  ?Procedure Laterality Date  ? A-FLUTTER ABLATION N/A 12/08/2020  ? Procedure: A-FLUTTER ABLATION;  Surgeon: Vickie Epley, MD;  Location: [place] [facility];  Service: Cardiovascular;  Laterality: N/A;  ? ATRIAL FIBRILLATION ABLATION N/A 01/27/2021  ? Procedure: ATRIAL FIBRILLATION ABLATION;  Surgeon: Vickie Epley, MD;  Location: [place] [facility];  Service: Cardiovascular;   Laterality: N/A;  ? CARDIOVERSION N/A 11/07/2020  ? Procedure: CARDIOVERSION;  Surgeon: Jolaine Artist, MD;  Location: [place];  Service: Cardiovascular;  Laterality: N/A;  ? CARDIOVERSION N/A 12/21/2020  ? Procedure: CARDIOVERSION;  Surgeon: Sueanne Margarita, MD;  Location: [facility]OS[facility];  Service: Cardiovascular;  Laterality: N/A;  ? RIGHT/LEFT HEART CATH AND CORONARY ANGIOGRAPHY N/A 11/04/2020  ? Procedure: RIGHT/LEFT HEART CATH AND CORONARY ANGIOGRAPHY;  Surgeon: Martinique, Peter M, MD;  Location: [place] [facility];  Service: Cardiovascular;  Laterality: N/A;  ? RIGHT/LEFT HEART CATH AND CORONARY/GRAFT ANGIOGRAPHY N/A 11/04/2020  ? Procedure: RIGHT/LEFT HEART CATH AND CORONARY/GRAFT ANGIOGRAPHY;  Surgeon: Martinique, Peter M, MD;  Location: [place] [facility];  Service: Cardiovascular;  Laterality: N/A;  ? TEE WITHOUT CARDIOVERSION N/A 11/07/2020  ? Procedure: TRANSESOPHAGEAL ECHOCARDIOGRAM (TEE);  Surgeon: Jolaine Artist, MD;  Location: [place];  Service: Cardiovascular;  Laterality: N/A;  ?  ?Social History:  reports that he has never smoked. He has never used smokeless tobacco. He reports that he does not drink alcohol and does not use drugs. ?Family History: family history includes Asthma in his father; COPD in his father. ? ? ?HOME MEDICATIONS: ?Allergies as of 08/09/2021   ?No Known Allergies ?  ? ?  ?Medication List  ?  ? ?  ? Accurate as of August 09, 2021 10:25 AM. If you have any questions, ask your nurse or doctor.  ?  ?  ? ?  ? ?  amiodarone 200 MG tablet ?Commonly known as: PACERONE ?Take 0.5 tablets (100 mg total) by mouth daily. ?  ?apixaban 5 MG Tabs tablet ?Commonly known as: ELIQUIS ?Take 1 tablet (5 mg total) by mouth 2 (two) times daily. ?  ?atorvastatin 40 MG tablet ?Commonly known as: LIPITOR ?Take 1 tablet (40 mg total) by mouth daily at 6 PM. ?  ?Farxiga 10 MG Tabs tablet ?Generic drug: dapagliflozin propanediol ?Take 10 mg by mouth daily. ?  ?losartan 25 MG tablet ?Commonly known as:  Cozaar ?Take 1 tablet (25 mg total) by mouth daily. ?  ?meclizine 25 MG tablet ?Commonly known as: ANTIVERT ?Take 25 mg by mouth 3 (three) times daily as needed for dizziness. ?  ?metoprolol succinate 25 MG 24 hr tablet ?Commonly known as: TOPROL-XL ?Take 1 tablet (25 mg total) by mouth daily. ?  ?pantoprazole 40 MG tablet ?Commonly known as: Protonix ?Take 1 tablet (40 mg total) by mouth daily for 45 doses. ?  ?spironolactone 25 MG tablet ?Commonly known as: ALDACTONE ?Take 0.5 tablets (12.5 mg total) by mouth at bedtime. ?  ? ?  ?  ? ? ?REVIEW OF SYSTEMS: ?A comprehensive ROS was conducted with the patient and is negative except as per HPI  ? ? ? ?OBJECTIVE:  ?VS: BP 120/82 (BP Location: Left Arm, Patient Position: Sitting, Cuff Size: Small)   Pulse 68   Ht '5\' 6"'$  (1.676 m)   Wt 175 lb 3.2 oz (79.5 kg)   SpO2 97%   BMI 28.28 kg/m?   ? ?Wt Readings from Last 3 Encounters:  ?08/09/21 175 lb 3.2 oz (79.5 kg)  ?05/09/21 181 lb 3.2 oz (82.2 kg)  ?04/26/21 182 lb (82.6 kg)  ? ? ? ?EXAM: ?General: Pt appears well and is in NAD  ?Neck: General: Supple without adenopathy. ?Thyroid: Thyroid size normal.  No goiter or nodules appreciated.   ?Lungs: Clear with good BS bilat with no rales, rhonchi, or wheezes  ?Heart: Auscultation: RRR.  ?Abdomen: Normoactive bowel sounds, soft, nontender, without masses or organomegaly palpable  ?Extremities:  ?BL LE: No pretibial edema normal ROM and strength.  ?Mental Status: Judgment, insight: Intact ?Orientation: Oriented to time, place, and person ?Mood and affect: No depression, anxiety, or agitation  ? ? ? ?DATA REVIEWED: ? Latest Reference Range & Units 08/09/21 10:48  ?Cortisol, Plasma ug/dL 9.7  ?Prolactin 2.0 - 18.0 ng/mL 17.3  ?Glucose 70 - 99 mg/dL 88  ?TSH 0.35 - 5.50 uIU/mL 1.17  ?T4,Free(Direct) 0.60 - 1.60 ng/dL 0.94  ? ? Latest Reference Range & Units 08/09/21 10:48  ?Sodium 135 - 145 mEq/L 139  ?Potassium 3.5 - 5.1 mEq/L 4.3  ?Chloride 96 - 112 mEq/L 106  ?CO2 19 - 32  mEq/L 26  ?Glucose 70 - 99 mg/dL 88  ?BUN 6 - 23 mg/dL 22  ?Creatinine 0.40 - 1.50 mg/dL 1.10  ?Calcium 8.4 - 10.5 mg/dL 9.2  ?GFR >60.00 mL/min 72.22  ? ? ? ?MRI 11/09/2020 ?  ?Brain: Bilobed diffusely enhancing sellar and suprasellar mass ?indistinguishable from a normal pituitary gland. Craniocaudal span ?is 2.9 cm and there is prominent chiasmatic compression and up ?lifting. Transverse span measures up to 2.1 cm. Anterior to ?posterior span is 22 mm. No cavernous sinus region invasion. ?  ?No incidental infarct, hemorrhage, hydrocephalus, or collection. ?  ?Vascular: Preserved flow voids and vascular enhancements ?  ?Skull and upper cervical spine: Normal marrow signal ?  ?Sinuses/Orbits: Negative ?  ?IMPRESSION: ?29 x 22 x 21 mm pituitary  macro adenoma with expanded sella and ?chiasmatic compression. ?  ?ASSESSMENT/PLAN/RECOMMENDATIONS:  ? ?Pituitary Macroadenoma : ? ? ?- Pt has headaches headaches as well as with chronic visual changes  ? - Per pt he had an abnormal visual field testing 02/2021 but he was unable to recall the name of the facility, no information through Epic nor "care everywhere"  ?- Prior pituitary hormone testing showed slight elevation of prolactin but this has normalized on today's labs ?- Per pt he was diagnosed with this ~ 14 yrs ago, at the time surgical intervention was offered but he declined. We discussed risk of blindness with chiasmic compression  ?-He was referred to neurosurgery on his last visit with me, we have confirmed that the patient have "no-show to his appointment", another appointment has been scheduled ? ? ? ? ? ?F/U in 6 months  ? ? ? ?Signed electronically by: ?Abby Nena Jordan, MD ? ?[facility]  ?[facility] ?[address] ?[place], [place] [address] ?Phone: [phone] ?FAX: [phone] ? ? ?CC: ?Sistasis, Rowena, MD ?[address] ?[place] [address] ?Phone: [phone] ?Fax: [phone] ? ? ?Return to Endocrinology clinic  as below: ?Future Appointments  ?Date Time Provider Early  ?08/09/2021 10:50 AM Tyhir Schwan, Melanie Crazier, MD [id number] None  ?08/31/2021 11:45 AM Vickie Epley, MD [facility]

## 2021-08-09 NOTE — Telephone Encounter (Signed)
Hunter Miller,  ? ?Can you please contact Dr. Sheldon Silvan office  at [facility] ? ? ?I referred the pt back in October but he is saying they did not schedule him yet.  ? ?He has a large pituiatry adenoma and most likely will need surgery, he has headaches and vision changes  ? ?Thanks a lot  ?

## 2021-08-15 LAB — PROLACTIN: Prolactin: 17.3 ng/mL (ref 2.0–18.0)

## 2021-08-15 LAB — ACTH: C206 ACTH: 28 pg/mL (ref 6–50)

## 2021-08-30 NOTE — Progress Notes (Signed)
?Electrophysiology Office Follow up Visit Note:   ? ?Date:  08/31/2021  ? ?ID:  Hunter Miller, DOB December 28, 1960, MRN [id number] ? ?PCP:  Gala Lewandowsky, MD  ?[facility] Cardiologist:  None  ?[facility] Electrophysiologist:  Vickie Epley, MD  ? ? ?Interval History:   ? ?Hunter Miller is a 61 y.o. male who presents for a follow up visit. They were last seen in clinic 04/26/2021. ? ?Since their last appointment, he last followed up with Dr. Haroldine Laws 05/09/2021 and was in NSR. At that visit he was doing well, only complaining of SOB when he runs. He had an Echo on that same day showing LVEF 55-60%. Continued on Eliquis 5 mg, losartan 25 mg, Farxiga 10 mg, Toprol XL 25 mg, and Spironolactone 12.5 mg. Amiodarone was decreased to 100 mg daily, could be stopped at follow-up with EP. ? ?He is accompanied by a professional interpreter. Overall, he states he is feeling good with improved energy levels.  ? ?He remains compliant with Eliquis twice daily. ? ?He is also concerned about his visual disturbance related to his pituitary macroadenoma. He asks if he would be clear to follow-up with neurology. ? ?He denies any palpitations, chest pain, shortness of breath, or peripheral edema. No lightheadedness, headaches, syncope, orthopnea, or PND. ? ? ?Spanish interpreter was used for today's visit. ? ?  ? ?Past Medical History:  ?Diagnosis Date  ? HTN (hypertension)   ? ? ?Past Surgical History:  ?Procedure Laterality Date  ? A-FLUTTER ABLATION N/A 12/08/2020  ? Procedure: A-FLUTTER ABLATION;  Surgeon: Vickie Epley, MD;  Location: [place] [facility];  Service: Cardiovascular;  Laterality: N/A;  ? ATRIAL FIBRILLATION ABLATION N/A 01/27/2021  ? Procedure: ATRIAL FIBRILLATION ABLATION;  Surgeon: Vickie Epley, MD;  Location: [place] [facility];  Service: Cardiovascular;  Laterality: N/A;  ? CARDIOVERSION N/A 11/07/2020  ? Procedure: CARDIOVERSION;  Surgeon: Jolaine Artist, MD;  Location: [place];  Service:  Cardiovascular;  Laterality: N/A;  ? CARDIOVERSION N/A 12/21/2020  ? Procedure: CARDIOVERSION;  Surgeon: Sueanne Margarita, MD;  Location: [facility] ENDOSCOPY;  Service: Cardiovascular;  Laterality: N/A;  ? RIGHT/LEFT HEART CATH AND CORONARY ANGIOGRAPHY N/A 11/04/2020  ? Procedure: RIGHT/LEFT HEART CATH AND CORONARY ANGIOGRAPHY;  Surgeon: Martinique, Peter M, MD;  Location: [place] [facility];  Service: Cardiovascular;  Laterality: N/A;  ? RIGHT/LEFT HEART CATH AND CORONARY/GRAFT ANGIOGRAPHY N/A 11/04/2020  ? Procedure: RIGHT/LEFT HEART CATH AND CORONARY/GRAFT ANGIOGRAPHY;  Surgeon: Martinique, Peter M, MD;  Location: [place] [facility];  Service: Cardiovascular;  Laterality: N/A;  ? TEE WITHOUT CARDIOVERSION N/A 11/07/2020  ? Procedure: TRANSESOPHAGEAL ECHOCARDIOGRAM (TEE);  Surgeon: Jolaine Artist, MD;  Location: [facility];  Service: Cardiovascular;  Laterality: N/A;  ? ? ?Current Medications: ?Current Meds  ?Medication Sig  ? apixaban (ELIQUIS) 5 MG TABS tablet Take 1 tablet (5 mg total) by mouth 2 (two) times daily.  ? atorvastatin (LIPITOR) 40 MG tablet Take 1 tablet (40 mg total) by mouth daily at 6 PM.  ? FARXIGA 10 MG TABS tablet Take 10 mg by mouth daily.  ? losartan (COZAAR) 25 MG tablet Take 1 tablet (25 mg total) by mouth daily.  ? metoprolol succinate (TOPROL-XL) 25 MG 24 hr tablet Take 1 tablet (25 mg total) by mouth daily.  ? [DISCONTINUED] amiodarone (PACERONE) 200 MG tablet Take 0.5 tablets (100 mg total) by mouth daily.  ?  ? ?Allergies:   Patient has no known allergies.  ? ?Social History  ? ?  Socioeconomic History  ? Marital status: Married  ?  Spouse name: Not on file  ? Number of children: Not on file  ? Years of education: Not on file  ? Highest education level: Not on file  ?Occupational History  ? Not on file  ?Tobacco Use  ? Smoking status: Never  ? Smokeless tobacco: Never  ?Substance and Sexual Activity  ? Alcohol use: Never  ? Drug use: Never  ? Sexual activity: Not on file  ?Other Topics Concern  ?  Not on file  ?Social History Narrative  ? Not on file  ? ?Social Determinants of Health  ? ?Financial Resource Strain: Medium Risk  ? Difficulty of Paying Living Expenses: Somewhat hard  ?Food Insecurity: Food Insecurity Present  ? Worried About Charity fundraiser in the Last Year: Sometimes true  ? Ran Out of Food in the Last Year: Sometimes true  ?Transportation Needs: No Transportation Needs  ? Lack of Transportation (Medical): No  ? Lack of Transportation (Non-Medical): No  ?Physical Activity: Not on file  ?Stress: Not on file  ?Social Connections: Not on file  ?  ? ?Family History: ?The patient's family history includes Asthma in his father; COPD in his father. ? ?ROS:   ?Please see the history of present illness.    ? ?All other systems reviewed and are negative. ? ?EKGs/Labs/Other Studies Reviewed:   ? ?The following studies were reviewed today: ? ?05/09/2021  Echo ? 1. Left ventricular ejection fraction, by estimation, is 55 to 60%. The  ?left ventricle has normal function. The left ventricle has no regional  ?wall motion abnormalities. Left ventricular diastolic parameters were  ?normal.  ? 2. Right ventricular systolic function is normal. The right ventricular  ?size is normal.  ? 3. Left atrial size was mild to moderately dilated.  ? 4. The mitral valve is normal in structure. Trivial mitral valve  ?regurgitation. No evidence of mitral stenosis.  ? 5. The aortic valve is normal in structure. Aortic valve regurgitation is  ?not visualized. No aortic stenosis is present.  ? 6. The inferior vena cava is normal in size with greater than 50%  ?respiratory variability, suggesting right atrial pressure of 3 mmHg.  ? ?01/27/2021  Atrial Fibrillation Ablation ?CONCLUSIONS: ?1. Successful PVI ?2. Successful ablation/isolation of the posterior wall ?3.  Successful ablation of an atypical atrial flutter utilizing the left inferior pulmonary vein and posterior wall as it circuit.  Ablation was performed on the  posterior aspect of the left inferior pulmonary vein successfully terminating the rhythm with 1 radiofrequency application. ?3. Intracardiac echo reveals dilated left atrium, moderately decreased left ventricular function, trivial to small pericardial effusion. ?4.  Three-dimensional electroanatomic map demonstrated significant scar burden around the left pulmonary veins and left atrial appendage. ?5. No early apparent complications. ?6.  Protonix 40 mg by mouth once daily for 45 days ?7.  Colchicine 0.6 mg by mouth twice daily for 5 days ?8. Lasix 20 mg IV at the conclusion of the case. ? ?01/20/2021  Cardiac CTA ?FINDINGS: ?Image quality: Average. ?  ?Noise artifact is: Limited. ?  ?Pulmonary Veins: There is normal pulmonary vein drainage into the ?left atrium (2 on the right and 2 on the left) with ostial ?measurements as follows: ?  ?RUPV: Ostium 22.5 mm x 17.3 mm  area 3.01 cm2 ?  ?RLPV:  Ostium 18.9 mm x 16.8 mm  area 2.43 cm2 ?  ?LUPV:  Ostium 19.7 mm x 17.4 mm area 2.66 cm2 ?  ?  LLPV:  Ostium 22.6 mm x 12.8 mm  area 2.21 cm2 ?  ?Left Atrium: The left atrial size is moderately enlarged. There is ?no PFO/ASD. There is possible density/thrombus in LAA tip on ?contrast but not evident on delayed imaging. The esophagus runs in ?the left atrial midline and is in close proximity to the left lower ?pulmonary vein ostia. ?  ?Coronary Arteries: CAC score of 0, which is 0 percentile for age-, ?race-, and sex-matched controls. Normal coronary origin. Right ?dominance. The study was performed without use of NTG and is ?insufficient for plaque evaluation. ?  ?Right Atrium: Right atrial size is within normal limits. ?  ?Right Ventricle: The right ventricular cavity is within normal ?limits. ?  ?Left Ventricle: The ventricular cavity size is within normal limits. ?There are no stigmata of prior infarction. There is no abnormal ?filling defect. ?  ?Pericardium: Normal thickness with no significant effusion or ?calcium  present. ?  ?Pulmonary Artery: Normal caliber without proximal filling defect. ?  ?Cardiac valves: The aortic valve is trileaflet without significant ?calcification. The mitral valve is normal structure without ?sign

## 2021-08-31 ENCOUNTER — Ambulatory Visit (INDEPENDENT_AMBULATORY_CARE_PROVIDER_SITE_OTHER): Payer: Medicaid Other | Admitting: Cardiology

## 2021-08-31 ENCOUNTER — Telehealth: Payer: Self-pay | Admitting: Cardiology

## 2021-08-31 ENCOUNTER — Encounter: Payer: Self-pay | Admitting: Cardiology

## 2021-08-31 VITALS — BP 128/78 | HR 68 | Ht 66.0 in | Wt 207.2 lb

## 2021-08-31 DIAGNOSIS — I5022 Chronic systolic (congestive) heart failure: Secondary | ICD-10-CM

## 2021-08-31 DIAGNOSIS — I4819 Other persistent atrial fibrillation: Secondary | ICD-10-CM

## 2021-08-31 DIAGNOSIS — I483 Typical atrial flutter: Secondary | ICD-10-CM

## 2021-08-31 MED ORDER — SPIRONOLACTONE 25 MG PO TABS
12.5000 mg | ORAL_TABLET | Freq: Every day | ORAL | 3 refills | Status: DC
Start: 2021-08-31 — End: 2022-08-22

## 2021-08-31 MED ORDER — SPIRONOLACTONE 25 MG PO TABS: 12.5000 mg | ORAL_TABLET | Freq: Every day | ORAL | 3 refills | Status: DC

## 2021-08-31 NOTE — Telephone Encounter (Signed)
?*  STAT* If patient is at the pharmacy, call can be transferred to refill team. ? ? ?1. Which medications need to be refilled? (please list name of each medication and dose if known)  ? spironolactone (ALDACTONE) 25 MG tablet  ? ? ?2. Which pharmacy/location (including street and city if local pharmacy) is medication to be sent to?  ?[place], [place] - [address] AT [address] ?3. Do they need a 30 day or 90 day supply?  ?90 day ? ?Pt's daughter said that pharmacy called and stated that they did not receive prescription. Please advise ? ?

## 2021-08-31 NOTE — Patient Instructions (Addendum)
Medication Instructions:  ?Stop Amiodarone ?Your physician recommends that you continue on your current medications as directed. Please refer to the Current Medication list given to you today. ?*If you need a refill on your cardiac medications before your next appointment, please call your pharmacy* ? ?Lab Work: ?None. ?If you have labs (blood work) drawn today and your tests are completely normal, you will receive your results only by: ?MyChart Message (if you have MyChart) OR ?A paper copy in the mail ?If you have any lab test that is abnormal or we need to change your treatment, we will call you to review the results. ? ?Testing/Procedures: ?None. ? ?Follow-Up: ?At [facility], you and your health needs are our priority.  As part of our continuing mission to provide you with exceptional heart care, we have created designated Provider Care Teams.  These Care Teams include your primary Cardiologist (physician) and Advanced Practice Providers (APPs -  Physician Assistants and Nurse Practitioners) who all work together to provide you with the care you need, when you need it. ? ?Your physician wants you to follow-up in: 6 months with the [facility]. ?They will contact you to schedule.  ? ?We recommend signing up for the patient portal called "MyChart".  Sign up information is provided on this After Visit Summary.  MyChart is used to connect with patients for Virtual Visits (Telemedicine).  Patients are able to view lab/test results, encounter notes, upcoming appointments, etc.  Non-urgent messages can be sent to your provider as well.   ?To learn more about what you can do with MyChart, go to [email or web address].   ? ?Any Other Special Instructions Will Be Listed Below (If Applicable). ? ? ? ? ?  ? ? ? ?

## 2021-08-31 NOTE — Telephone Encounter (Signed)
Called to inform them that I called the pharmacy and they received.the Rx and they are getting pt's medication ready for pt to pick up. Daughter verbalized understanding.  ?

## 2021-09-05 ENCOUNTER — Ambulatory Visit
Admission: RE | Admit: 2021-09-05 | Discharge: 2021-09-05 | Disposition: A | Discharge status: Home / Self Care | Payer: Medicaid Other | Source: Ambulatory Visit | Attending: Internal Medicine | Admitting: Internal Medicine

## 2021-09-05 DIAGNOSIS — D352 Benign neoplasm of pituitary gland: Secondary | ICD-10-CM

## 2021-09-05 IMAGING — MR MR HEAD WO/W CM
20 series · 48 of 48 positions shown · IV contrast (multihance)
Comparison: MRI head and pituitary [DATE]

CLINICAL DATA: Pituitary adenoma follow-up. Chronic vision changes.

EXAM:
MRI HEAD WITHOUT AND WITH CONTRAST
TECHNIQUE: Multiplanar, multiecho pulse sequences of the brain and surrounding
structures were obtained without and with intravenous contrast.
CONTRAST:  10mL MULTIHANCE GADOBENATE DIMEGLUMINE 529 MG/ML IV SOLN

[Series 5: T1 · sagittal · 4.0mm · 0.72mm/px · 1 of 27 slices shown (1 of 5)]
[im 1/27]
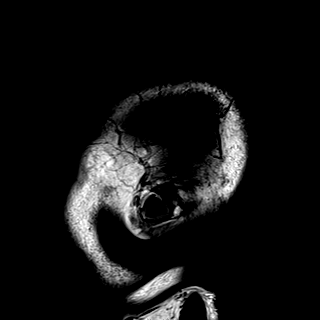

[Series 6: DWI · axial · 3.0mm · 0.94mm/px · z∈[-51,+90]mm · 10 of 160 slices shown]
[im 1/160]
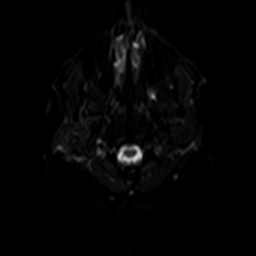
[im 18/160]
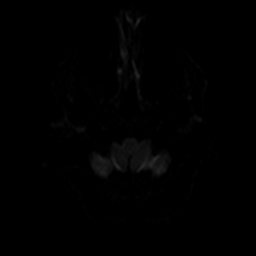
[im 36/160]
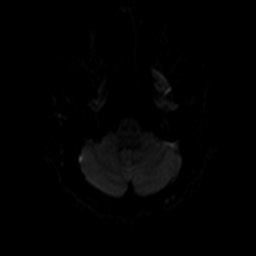
[im 54/160]
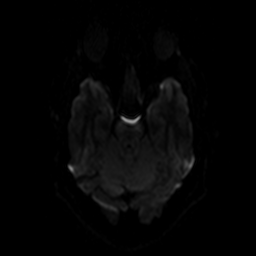
[im 71/160]
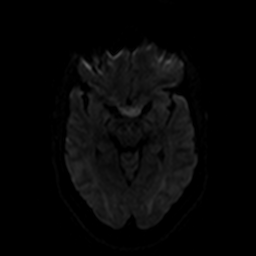
[im 89/160]
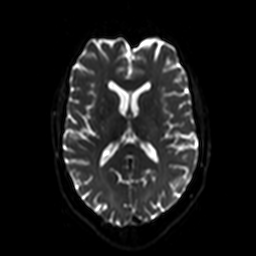
[im 107/160]
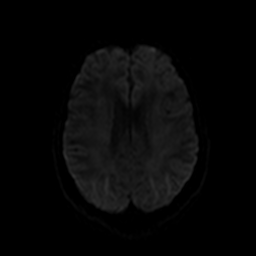
[im 124/160]
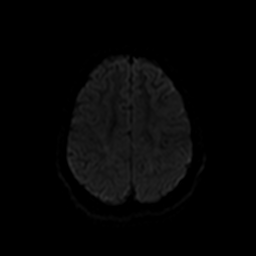
[im 142/160]
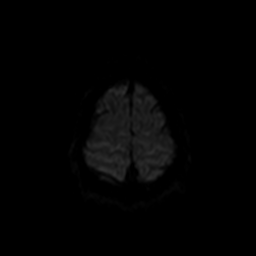
[im 160/160]
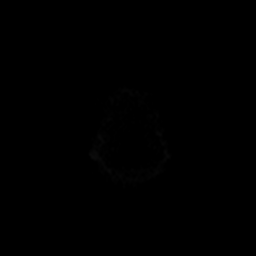

[Series 7: ax dwi_tracew · axial · 3.0mm · 0.94mm/px · z∈[-51,+90]mm · 4 of 80 slices shown]
[im 1/80]
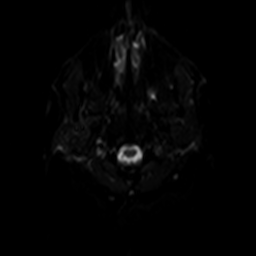
[im 27/80]
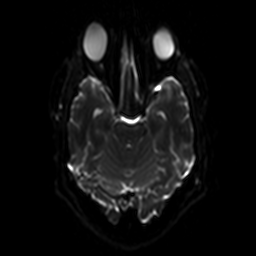
[im 53/80]
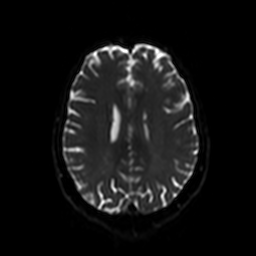
[im 80/80]
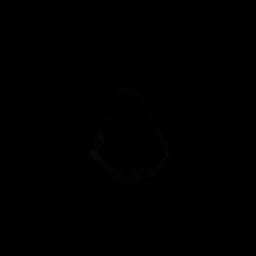

[Series 8: ax dwi_adc · axial · 3.0mm · 0.94mm/px · z∈[-51,+90]mm · 2 of 40 slices shown]
[im 1/40]
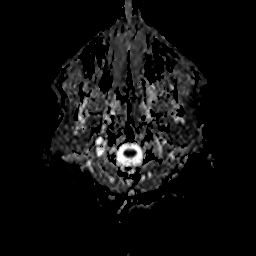
[im 40/40]
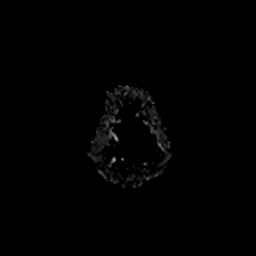

[Series 9: T2 · axial · 4.0mm · 0.36mm/px · 1 of 26 slices shown (1 of 2)]
[im 1/26]
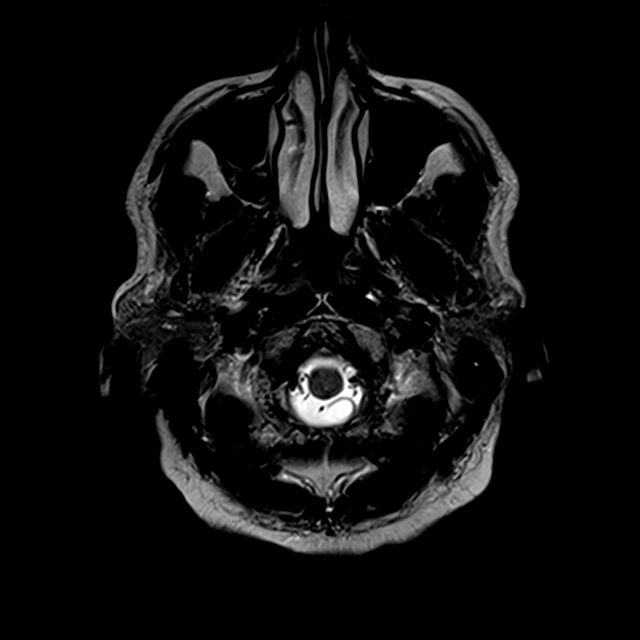

[Series 11: swi_images · axial · 1.5mm · 0.90mm/px · z∈[-58,+84]mm · 6 of 96 slices shown]
[im 1/96]
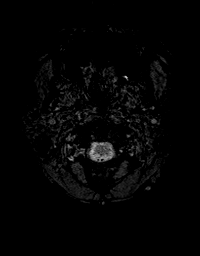
[im 20/96]
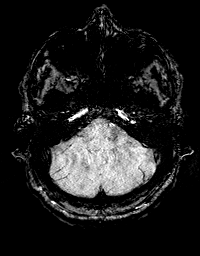
[im 39/96]
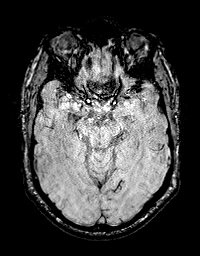
[im 58/96]
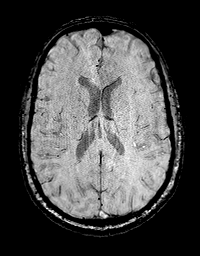
[im 77/96]
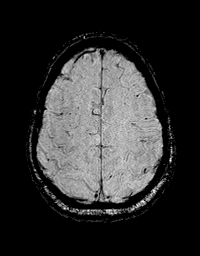
[im 96/96]
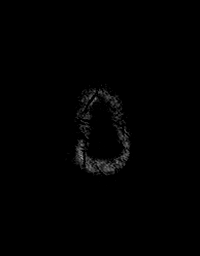

[Series 12: FLAIR · axial · 3.0mm · 0.72mm/px · z∈[-50,+88]mm · 2 of 24 slices shown]
[im 1/24]
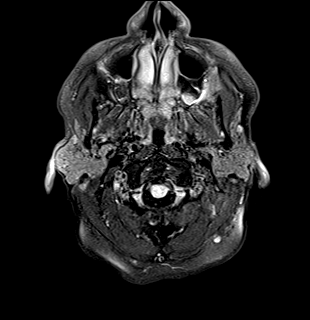
[im 24/24]
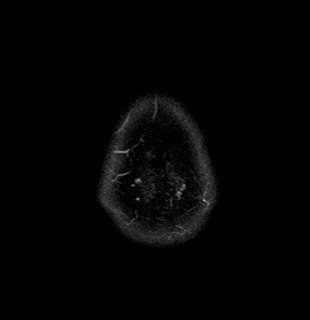

[Series 13: T1 · sagittal · 3.0mm · 0.42mm/px · 1 of 13 slices shown (2 of 5)]
[im 1/13]
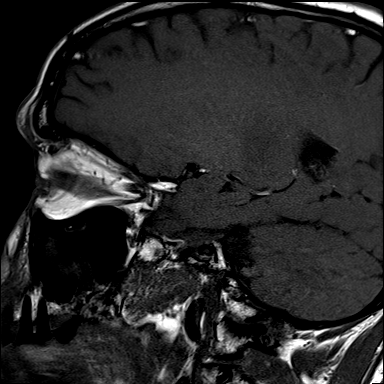

[Series 14: T2 · coronal · 3.0mm · 0.42mm/px · 1 of 10 slices shown (2 of 2)]
[im 1/10]
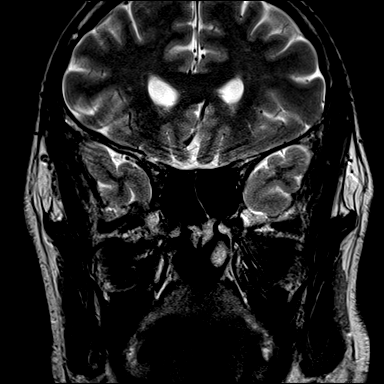

[Series 15: T1 · coronal · 3.0mm · 0.42mm/px · 1 of 10 slices shown (3 of 5)]
[im 1/10]
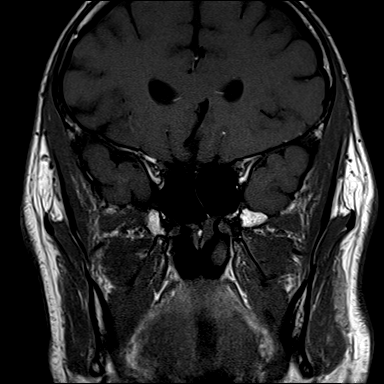

[Series 16: T1 · coronal · non-contrast · 3.0mm · 0.62mm/px · 1 of 10 slices shown (4 of 5)]
[im 1/10]
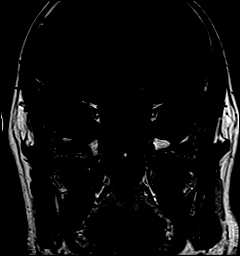

[Series 17: cor post dyn · coronal · 3.0mm · 0.62mm/px · 1 of 10 slices shown (1 of 6)]
[im 1/10]
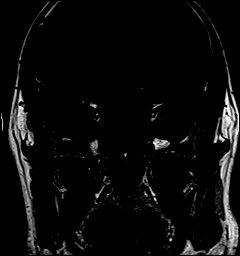

[Series 18: cor post dyn · coronal · 3.0mm · 0.62mm/px · 1 of 10 slices shown (2 of 6)]
[im 1/10]
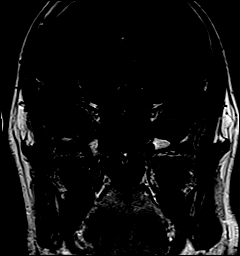

[Series 19: cor post dyn · coronal · 3.0mm · 0.62mm/px · 1 of 10 slices shown (3 of 6)]
[im 1/10]
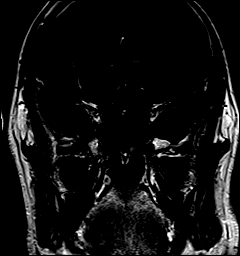

[Series 20: cor post dyn · coronal · 3.0mm · 0.62mm/px · 1 of 10 slices shown (4 of 6)]
[im 1/10]
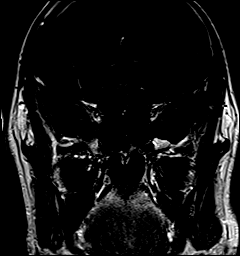

[Series 21: cor post dyn · coronal · 3.0mm · 0.62mm/px · 1 of 10 slices shown (5 of 6)]
[im 1/10]
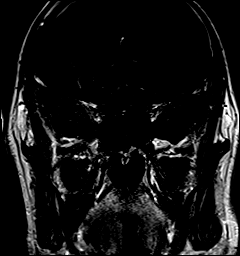

[Series 22: cor post dyn · coronal · 3.0mm · 0.62mm/px · 1 of 10 slices shown (6 of 6)]
[im 1/10]
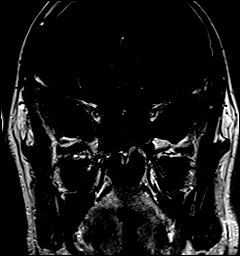

[Series 23: T1 post-contrast · sagittal · 3.0mm · 0.42mm/px · 1 of 13 slices shown (1 of 2)]
[im 1/13]
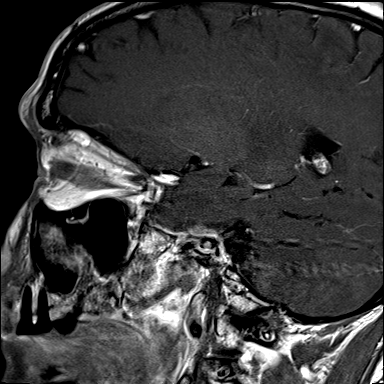

[Series 24: T1 post-contrast · coronal · 3.0mm · 0.42mm/px · 1 of 10 slices shown (2 of 2)]
[im 1/10]
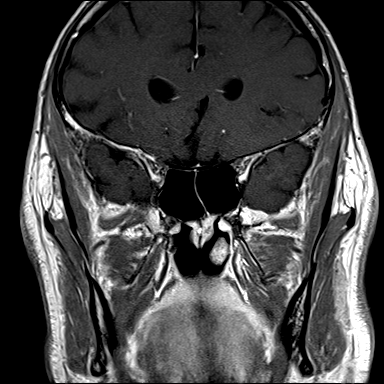

[Series 25: T1 · axial · 1.0mm · 0.86mm/px · z∈[-49,+94]mm · 10 of 144 slices shown (5 of 5)]
[im 1/144]
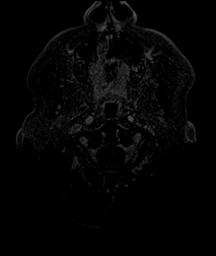
[im 16/144]
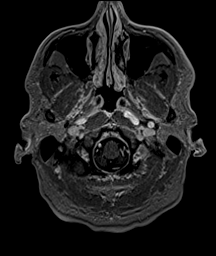
[im 32/144]
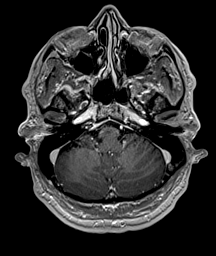
[im 48/144]
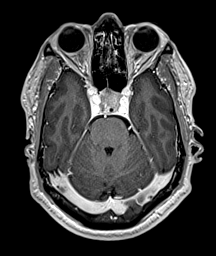
[im 64/144]
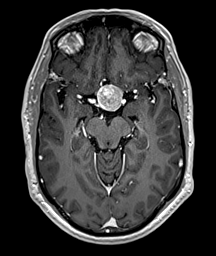
[im 80/144]
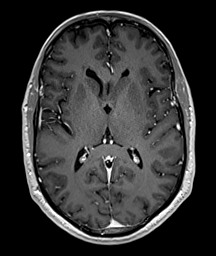
[im 96/144]
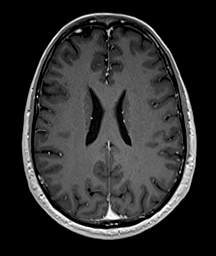
[im 112/144]
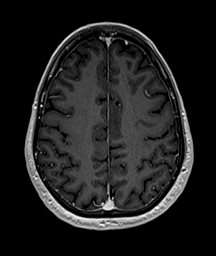
[im 128/144]
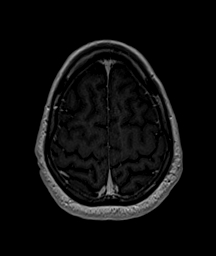
[im 144/144]
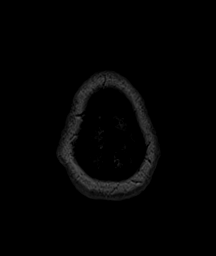

[48 of 48 positions shown; findings below may reference images not displayed]

FINDINGS: Brain: Enhancing solid mass filling the sella and extending into the
suprasellar cistern. The sella is significantly enlarged. The mass
is similar in size and appearance to the prior study. The mass
measures approximately 29 x 22 x 20 mm in diameter. There is
compression of the optic chiasm which appears unchanged. No
extension into the cavernous sinus.

Ventricle size normal. Negative for acute infarct. Mild white matter
changes with small white matter hyperintensities bilaterally. No
hemorrhage or fluid collection.

Vascular: Normal arterial flow voids at the skull base.

Skull and upper cervical spine: No focal skeletal lesion.

Sinuses/Orbits: Paranasal sinuses clear.  Negative orbit

Other: None
IMPRESSION: Large solid mass enlarging the sella extending into the suprasellar
cistern. There is compression of the optic chiasm. The mass is
unchanged in size and appearance to the prior MRI of [DATE].
Findings compatible with pituitary macro adenoma.

## 2021-09-05 MED ORDER — GADOBENATE DIMEGLUMINE 529 MG/ML IV SOLN
10.0000 mL | Freq: Once | INTRAVENOUS | Status: AC | PRN
  Administered 2021-09-05: 10 mL via INTRAVENOUS

## 2021-10-19 ENCOUNTER — Other Ambulatory Visit: Payer: Self-pay | Admitting: Neurological Surgery

## 2021-10-23 ENCOUNTER — Other Ambulatory Visit: Payer: Self-pay | Admitting: Otolaryngology

## 2021-11-06 ENCOUNTER — Other Ambulatory Visit: Payer: Self-pay

## 2021-11-06 MED ORDER — APIXABAN 5 MG PO TABS: 5.0000 mg | ORAL_TABLET | Freq: Two times a day (BID) | ORAL | 5 refills | Status: DC

## 2021-11-06 NOTE — Telephone Encounter (Signed)
Prescription refill request for Eliquis received. Indication:Afib Last office visit:5/23 Scr:1.1 Age: 61 Weight:94 kg  Prescription refilled

## 2021-11-15 NOTE — Pre-Procedure Instructions (Signed)
Surgical Instructions    Your procedure is scheduled on Wednesday 11/22/21.   Report to [facility] Main Entrance "A" at 06:30 A.M., then check in with the Admitting office.  Call this number if you have problems the morning of surgery:  [phone]   If you have any questions prior to your surgery date call [phone]: Open Monday-Friday 8am-4pm    Remember:  Do not eat after midnight the night before your surgery  You may drink clear liquids until 05:30 A.M. the morning of your surgery.   Clear liquids allowed are: Water, Non-Citrus Juices (without pulp), Carbonated Beverages, Clear Tea, Black Coffee ONLY (NO MILK, CREAM OR POWDERED CREAMER of any kind), and Gatorade    Take these medicines the morning of surgery with A SIP OF WATER:   metoprolol succinate (TOPROL-XL)    Please follow your surgeon's instructions regarding apixaban (ELIQUIS). If you have not received instructions then please contact your surgeon's office for instructions.   As of today, STOP taking any Aspirin (unless otherwise instructed by your surgeon) Aleve, Naproxen, Ibuprofen, Motrin, Advil, Goody's, BC's, all herbal medications, fish oil, and all vitamins.  WHAT DO I DO ABOUT MY DIABETES MEDICATION?   Do not take oral diabetes medicines (pills) the morning of surgery.  DO NOT TAKE FARXIGA three days (72 hours) prior to surgery.   The day of surgery, do not take other diabetes injectables, including Byetta (exenatide), Bydureon (exenatide ER), Victoza (liraglutide), or Trulicity (dulaglutide).  HOW TO MANAGE YOUR DIABETES BEFORE AND AFTER SURGERY  Why is it important to control my blood sugar before and after surgery? Improving blood sugar levels before and after surgery helps healing and can limit problems. A way of improving blood sugar control is eating a healthy diet by:  Eating less sugar and carbohydrates  Increasing activity/exercise  Talking with your doctor about reaching your blood sugar  goals High blood sugars (greater than 180 mg/dL) can raise your risk of infections and slow your recovery, so you will need to focus on controlling your diabetes during the weeks before surgery. Make sure that the doctor who takes care of your diabetes knows about your planned surgery including the date and location.  How do I manage my blood sugar before surgery? Check your blood sugar at least 4 times a day, starting 2 days before surgery, to make sure that the level is not too high or low.  Check your blood sugar the morning of your surgery when you wake up and every 2 hours until you get to the Short Stay unit.  If your blood sugar is less than 70 mg/dL, you will need to treat for low blood sugar: Do not take insulin. Treat a low blood sugar (less than 70 mg/dL) with  cup of clear juice (cranberry or apple), 4 glucose tablets, OR glucose gel. Recheck blood sugar in 15 minutes after treatment (to make sure it is greater than 70 mg/dL). If your blood sugar is not greater than 70 mg/dL on recheck, call [phone] for further instructions. Report your blood sugar to the short stay nurse when you get to Short Stay.  If you are admitted to the hospital after surgery: Your blood sugar will be checked by the staff and you will probably be given insulin after surgery (instead of oral diabetes medicines) to make sure you have good blood sugar levels. The goal for blood sugar control after surgery is 80-180 mg/dL.           Do  not wear jewelry or makeup Do not wear lotions, powders, perfumes/colognes, or deodorant. Do not shave 48 hours prior to surgery.  Men may shave face and neck. Do not bring valuables to the hospital. Do not wear nail polish, gel polish, artificial nails, or any other type of covering on natural nails (fingers and toes) If you have artificial nails or gel coating that need to be removed by a nail salon, please have this removed prior to surgery. Artificial nails or gel  coating may interfere with anesthesia's ability to adequately monitor your vital signs.  [facility] is not responsible for any belongings or valuables. .   Do NOT Smoke (Tobacco/Vaping)  24 hours prior to your procedure  If you use a CPAP at night, you may bring your mask for your overnight stay.   Contacts, glasses, hearing aids, dentures or partials may not be worn into surgery, please bring cases for these belongings   For patients admitted to the hospital, discharge time will be determined by your treatment team.   Patients discharged the day of surgery will not be allowed to drive home, and someone needs to stay with them for 24 hours.   SURGICAL WAITING ROOM VISITATION Patients having surgery or a procedure may have no more than 2 support people in the waiting area - these visitors may rotate.   Children under the age of 46 must have an adult with them who is not the patient. If the patient needs to stay at the hospital during part of their recovery, the visitor guidelines for inpatient rooms apply. Pre-op nurse will coordinate an appropriate time for 1 support person to accompany patient in pre-op.  This support person may not rotate.   Please refer to the [facility] website for the visitor guidelines for Inpatients (after your surgery is over and you are in a regular room).    Special instructions:    Oral Hygiene is also important to reduce your risk of infection.  Remember - BRUSH YOUR TEETH THE MORNING OF SURGERY WITH YOUR REGULAR TOOTHPASTE   [place]- Preparing For Surgery  Before surgery, you can play an important role. Because skin is not sterile, your skin needs to be as free of germs as possible. You can reduce the number of germs on your skin by washing with CHG (chlorahexidine gluconate) Soap before surgery.  CHG is an antiseptic cleaner which kills germs and bonds with the skin to continue killing germs even after washing.     Please do not use if you have an  allergy to CHG or antibacterial soaps. If your skin becomes reddened/irritated stop using the CHG.  Do not shave (including legs and underarms) for at least 48 hours prior to first CHG shower. It is OK to shave your face.  Please follow these instructions carefully.     Shower the NIGHT BEFORE SURGERY and the MORNING OF SURGERY with CHG Soap.   If you chose to wash your hair, wash your hair first as usual with your normal shampoo. After you shampoo, rinse your hair and body thoroughly to remove the shampoo.  Then [employer] and genitals (private parts) with your normal soap and rinse thoroughly to remove soap.  After that Use CHG Soap as you would any other liquid soap. You can apply CHG directly to the skin and wash gently with a scrungie or a clean washcloth.   Apply the CHG Soap to your body ONLY FROM THE NECK DOWN.  Do not use  on open wounds or open sores. Avoid contact with your eyes, ears, mouth and genitals (private parts). Wash Face and genitals (private parts)  with your normal soap.   Wash thoroughly, paying special attention to the area where your surgery will be performed.  Thoroughly rinse your body with warm water from the neck down.  DO NOT shower/wash with your normal soap after using and rinsing off the CHG Soap.  Pat yourself dry with a CLEAN TOWEL.  Wear CLEAN PAJAMAS to bed the night before surgery  Place CLEAN SHEETS on your bed the night before your surgery  DO NOT SLEEP WITH PETS.   Day of Surgery:  Take a shower with CHG soap. Wear Clean/Comfortable clothing the morning of surgery Do not apply any deodorants/lotions.   Remember to brush your teeth WITH YOUR REGULAR TOOTHPASTE.    If you received a COVID test during your pre-op visit, it is requested that you wear a mask when out in public, stay away from anyone that may not be feeling well, and notify your surgeon if you develop symptoms. If you have been in contact with anyone that has tested positive in  the last 10 days, please notify your surgeon.    Please read over the following fact sheets that you were given.

## 2021-11-16 ENCOUNTER — Other Ambulatory Visit: Payer: Self-pay

## 2021-11-16 ENCOUNTER — Encounter (HOSPITAL_COMMUNITY): Payer: Self-pay

## 2021-11-16 ENCOUNTER — Encounter (HOSPITAL_COMMUNITY)
Admission: RE | Admit: 2021-11-16 | Discharge: 2021-11-16 | Disposition: A | Discharge status: Home / Self Care | Payer: Medicaid Other | Source: Ambulatory Visit | Attending: Neurological Surgery | Admitting: Neurological Surgery

## 2021-11-16 ENCOUNTER — Encounter: Payer: Self-pay | Admitting: Cardiology

## 2021-11-16 VITALS — BP 130/92 | HR 66 | Temp 97.6°F | Resp 17 | Ht 66.0 in | Wt 176.1 lb

## 2021-11-16 DIAGNOSIS — I509 Heart failure, unspecified: Secondary | ICD-10-CM | POA: Insufficient documentation

## 2021-11-16 DIAGNOSIS — I4891 Unspecified atrial fibrillation: Secondary | ICD-10-CM | POA: Diagnosis not present

## 2021-11-16 DIAGNOSIS — Z01812 Encounter for preprocedural laboratory examination: Secondary | ICD-10-CM | POA: Diagnosis not present

## 2021-11-16 DIAGNOSIS — Z7901 Long term (current) use of anticoagulants: Secondary | ICD-10-CM | POA: Insufficient documentation

## 2021-11-16 DIAGNOSIS — Z01818 Encounter for other preprocedural examination: Secondary | ICD-10-CM

## 2021-11-16 HISTORY — DX: Cardiac arrhythmia, unspecified: I49.9

## 2021-11-16 HISTORY — DX: Other specified postprocedural states: Z98.890

## 2021-11-16 HISTORY — DX: Other specified postprocedural states: R11.2

## 2021-11-16 LAB — TYPE AND SCREEN
ABO/RH(D): A POS
Antibody Screen: NEGATIVE

## 2021-11-16 NOTE — Telephone Encounter (Signed)
[facility] pharmacy is requesting a refill on Atorvastatin 40 mg tablet. Would Dr. Quentin Ore like to refill this medication? Please address

## 2021-11-16 NOTE — Pre-Procedure Instructions (Addendum)
Surgical Instructions    Your procedure is scheduled on Wednesday 11/22/21 at 8:30 AM.   Report to [facility] Main Entrance "A" at 06:30 A.M., then check in with the Admitting office.  Call this number if you have problems the morning of surgery:  [phone]   If you have any questions prior to your surgery date call [phone]: Open Monday-Friday 8am-4pm    Remember:  Do not eat after midnight the night before your surgery  You may drink clear liquids until 05:30 A.M. the morning of your surgery.   Clear liquids allowed are: Water, Non-Citrus Juices (without pulp), Carbonated Beverages, Clear Tea, Black Coffee ONLY (NO MILK, CREAM OR POWDERED CREAMER of any kind), and Gatorade    Take these medicines the morning of surgery with A SIP OF WATER:   metoprolol succinate (TOPROL-XL)    STOP Eliquis 2 days prior to surgery per Dr. Mardene Speak instructions from January, 2023.   As of today, STOP taking any Aspirin (unless otherwise instructed by your surgeon) Aleve, Naproxen, Ibuprofen, Motrin, Advil, Goody's, BC's, all herbal medications, fish oil, and all vitamins.   DO NOT TAKE FARXIGA three days (72 hours) prior to surgery.           Do not wear jewelry or makeup Do not wear lotions, powders, perfumes/cologne or deodorant. Do not shave 48 hours prior to surgery.  Men may shave face and neck. Do not bring valuables to the hospital.  [facility] is not responsible for any belongings or valuables. .   Do NOT Smoke (Tobacco/Vaping)  24 hours prior to your procedure  If you use a CPAP at night, you may bring your mask for your overnight stay.   Contacts, glasses, hearing aids, dentures or partials may not be worn into surgery, please bring cases for these belongings   For patients admitted to the hospital, discharge time will be determined by your treatment team.   Patients discharged the day of surgery will not be allowed to drive home, and someone needs to stay with them for  24 hours.   SURGICAL WAITING ROOM VISITATION Patients having surgery or a procedure may have no more than 2 support people in the waiting area - these visitors may rotate.   Children under the age of 72 must have an adult with them who is not the patient. If the patient needs to stay at the hospital during part of their recovery, the visitor guidelines for inpatient rooms apply. Pre-op nurse will coordinate an appropriate time for 1 support person to accompany patient in pre-op.  This support person may not rotate.   Please refer to the [facility] website for the visitor guidelines for Inpatients (after your surgery is over and you are in a regular room).    Special instructions:    Oral Hygiene is also important to reduce your risk of infection.  Remember - BRUSH YOUR TEETH THE MORNING OF SURGERY WITH YOUR REGULAR TOOTHPASTE   [place]- Preparing For Surgery  Before surgery, you can play an important role. Because skin is not sterile, your skin needs to be as free of germs as possible. You can reduce the number of germs on your skin by washing with CHG (chlorahexidine gluconate) Soap before surgery.  CHG is an antiseptic cleaner which kills germs and bonds with the skin to continue killing germs even after washing.     Please do not use if you have an allergy to CHG or antibacterial soaps. If your skin becomes  reddened/irritated stop using the CHG.  Do not shave (including legs and underarms) for at least 48 hours prior to first CHG shower. It is OK to shave your face.  Please follow these instructions carefully.     Shower the NIGHT BEFORE SURGERY and the MORNING OF SURGERY with CHG Soap.   If you chose to wash your hair, wash your hair first as usual with your normal shampoo. After you shampoo, rinse your hair and body thoroughly to remove the shampoo.  Then [employer] and genitals (private parts) with your normal soap and rinse thoroughly to remove soap.  After that Use CHG Soap  as you would any other liquid soap. You can apply CHG directly to the skin and wash gently with a scrungie or a clean washcloth.   Apply the CHG Soap to your body ONLY FROM THE NECK DOWN.  Do not use on open wounds or open sores. Avoid contact with your eyes, ears, mouth and genitals (private parts). Wash Face and genitals (private parts)  with your normal soap.   Wash thoroughly, paying special attention to the area where your surgery will be performed.  Thoroughly rinse your body with warm water from the neck down.  DO NOT shower/wash with your normal soap after using and rinsing off the CHG Soap.  Pat yourself dry with a CLEAN TOWEL.  Wear CLEAN PAJAMAS to bed the night before surgery  Place CLEAN SHEETS on your bed the night before your surgery  DO NOT SLEEP WITH PETS.   Day of Surgery:  Take a shower with CHG soap. Wear Clean/Comfortable clothing the morning of surgery Do not apply any deodorants/lotions.   Remember to brush your teeth WITH YOUR REGULAR TOOTHPASTE.    Please read over the fact sheets that you were given.

## 2021-11-16 NOTE — Progress Notes (Addendum)
PCP - Dr. Spero Curb Cardiologist - Dr. Quentin Ore and Dr. Haroldine Laws   EKG - 08/31/21 ECHO - 05/09/21 Cardiac Cath - 11/04/21  Blood Thinner Instructions: Eliquis - instructed to hold 2-3 days prior to surgery per notes by Dr. Quentin Ore on 04/26/21.   ERAS Protcol - clear liquids, no drink   Anesthesia review: yes, heart history - A-fib and Chronic systolic heart failure  Pt is supposed to be on Metoprolol, he thought he was told to stop the medication and has not taken it for a few months. Hospital pharmacist called Dr. Mardene Speak office and spoke with a nurse and she states pt is supposed to be taking it. Instructed pt and his daughter to call the pharmacy and have them fill that Rx. They both voiced understanding.   Patient denies shortness of breath, fever, cough and chest pain at PAT appointment   All instructions explained to the patient, with a verbal understanding of the material. Patient agrees to go over the instructions while at home for a better understanding. Patient also instructed to self quarantine after being tested for COVID-19. The opportunity to ask questions was provided.

## 2021-11-17 ENCOUNTER — Telehealth: Payer: Self-pay | Admitting: Cardiology

## 2021-11-17 ENCOUNTER — Other Ambulatory Visit: Payer: Self-pay

## 2021-11-17 MED ORDER — ATORVASTATIN CALCIUM 40 MG PO TABS: 40.0000 mg | ORAL_TABLET | Freq: Every day | ORAL | 3 refills | Status: DC

## 2021-11-17 NOTE — Telephone Encounter (Signed)
*  STAT* If patient is at the pharmacy, call can be transferred to refill team.   1. Which medications need to be refilled? (please list name of each medication and dose if known) atorvastatin (LIPITOR) 40 MG tablet  2. Which pharmacy/location (including street and city if local pharmacy) is medication to be sent to? [id number] - [place], [place] - [address] AT [address]  3. Do they need a 30 day or 90 day supply? 90 day  Patient is out of the medication

## 2021-11-17 NOTE — Progress Notes (Signed)
Anesthesia Chart Review:  Follows with cardiology for history of A-fib s/p ablation 03/01/4173, systolic heart failure with recovered EF.  Most recent echo 05/09/2021 showed EF 55 to 60%.  When last seen by Dr. Quentin Ore 08/31/2021 he was doing well, and reported SOB when he runs.  Amiodarone was stopped due to maintenance of sinus rhythm.  He was continued on Eliquis.  28-monthfollow-up recommended Dr. LQuentin Orepreviously stated in note 04/26/2021 that it was okay for patient to hold anticoagulation 2 to 3 days before procedure and restart when felt safe from surgical perspective.    Patient unclear on how long Dr. OVenetia Constableneeded him to hold Eliquis.  I called Dr. OBarbra Sarksoffice on 11/17/2021 and spoke with VLorriane Shireand advised patient needs to instructed today on when to hold Eliquis.  She stated that she would take care of it.  PAT RN forgot to release lab orders at visit, patient will need day of surgery labs.  EKG 08/31/2021: Sinus rhythm.  Rate 68.  First-degree block.  TTE 05/09/2021:  1. Left ventricular ejection fraction, by estimation, is 55 to 60%. The  left ventricle has normal function. The left ventricle has no regional  wall motion abnormalities. Left ventricular diastolic parameters were  normal.   2. Right ventricular systolic function is normal. The right ventricular  size is normal.   3. Left atrial size was mild to moderately dilated.   4. The mitral valve is normal in structure. Trivial mitral valve  regurgitation. No evidence of mitral stenosis.   5. The aortic valve is normal in structure. Aortic valve regurgitation is  not visualized. No aortic stenosis is present.   6. The inferior vena cava is normal in size with greater than 50%  respiratory variability, suggesting right atrial pressure of 3 mmHg.   Right/left heart cath 11/04/2020: Very large and normal coronary arteries. Normal LV filling pressures. Normal right  heart pressures.  Reduced cardiac output. Index  2.26.     JKaroline Caldwell PA-C [facility] Alliance[facility] Stay Center/Anesthesiology Phone ([phone]/28/2023 3:34 PM

## 2021-11-17 NOTE — Anesthesia Preprocedure Evaluation (Addendum)
Anesthesia Evaluation  Patient identified by MRN, date of birth, ID band Patient awake    Reviewed: Allergy & Precautions, NPO status , Patient's Chart, lab work & pertinent test results, reviewed documented beta blocker date and time   History of Anesthesia Complications (+) PONV and history of anesthetic complications  Airway Mallampati: II  TM Distance: >3 FB Neck ROM: Full    Dental  (+) Dental Advisory Given, Missing   Pulmonary neg pulmonary ROS,    Pulmonary exam normal breath sounds clear to auscultation       Cardiovascular hypertension, Pt. on home beta blockers and Pt. on medications +CHF  Normal cardiovascular exam+ dysrhythmias Atrial Fibrillation  Rhythm:Regular Rate:Normal  Echo 05/09/21: 1. Left ventricular ejection fraction, by estimation, is 55 to 60%. The  left ventricle has normal function. The left ventricle has no regional  wall motion abnormalities. Left ventricular diastolic parameters were  normal.  2. Right ventricular systolic function is normal. The right ventricular  size is normal.  3. Left atrial size was mild to moderately dilated.  4. The mitral valve is normal in structure. Trivial mitral valve  regurgitation. No evidence of mitral stenosis.  5. The aortic valve is normal in structure. Aortic valve regurgitation is  not visualized. No aortic stenosis is present.  6. The inferior vena cava is normal in size with greater than 50%  respiratory variability, suggesting right atrial pressure of 3 mmHg.    Neuro/Psych  Headaches, Pituitary adenoma  negative psych ROS   GI/Hepatic negative GI ROS, Neg liver ROS,   Endo/Other  negative endocrine ROS  Renal/GU negative Renal ROS     Musculoskeletal negative musculoskeletal ROS (+)   Abdominal   Peds  Hematology  (+) Blood dyscrasia (Eliquis), ,   Anesthesia Other Findings   Reproductive/Obstetrics                            Anesthesia Physical Anesthesia Plan  ASA: 3  Anesthesia Plan: General   Post-op Pain Management: Tylenol PO (pre-op)*   Induction: Intravenous  PONV Risk Score and Plan: 3 and Midazolam, Dexamethasone, Ondansetron and Propofol infusion  Airway Management Planned: Oral ETT  Additional Equipment:   Intra-op Plan:   Post-operative Plan: Extubation in OR  Informed Consent: I have reviewed the patients History and Physical, chart, labs and discussed the procedure including the risks, benefits and alternatives for the proposed anesthesia with the patient or authorized representative who has indicated his/her understanding and acceptance.     Dental advisory given and Interpreter used for interveiw  Plan Discussed with: CRNA  Anesthesia Plan Comments: (2nd PIV after induction  PAT note by Antionette Poles, PA-C: Follows with cardiology for history of A-fib s/p ablation 01/27/2021, systolic heart failure with recovered EF.  Most recent echo 05/09/2021 showed EF 55 to 60%.  When last seen by Dr. Lalla Brothers 08/31/2021 he was doing well, and reported SOB when he runs.  Amiodarone was stopped due to maintenance of sinus rhythm.  He was continued on Eliquis.  4-month follow-up recommended Dr. Lalla Brothers previously stated in note 04/26/2021 that it was okay for patient to hold anticoagulation 2 to 3 days before procedure and restart when felt safe from surgical perspective.    Patient unclear on how long Dr. Johnsie Cancel needed him to hold Eliquis.  I called Dr. Romana Juniper office on 11/17/2021 and spoke with Erie Noe and advised patient needs to instructed today on when to hold Eliquis.  She stated that she would take care of it.  PAT RN forgot to release lab orders at visit, patient will need day of surgery labs.  EKG 08/31/2021: Sinus rhythm.  Rate 68.  First-degree block.  TTE 05/09/2021: 1. Left ventricular ejection fraction, by estimation, is 55 to 60%. The   left ventricle has normal function. The left ventricle has no regional  wall motion abnormalities. Left ventricular diastolic parameters were  normal.  2. Right ventricular systolic function is normal. The right ventricular  size is normal.  3. Left atrial size was mild to moderately dilated.  4. The mitral valve is normal in structure. Trivial mitral valve  regurgitation. No evidence of mitral stenosis.  5. The aortic valve is normal in structure. Aortic valve regurgitation is  not visualized. No aortic stenosis is present.  6. The inferior vena cava is normal in size with greater than 50%  respiratory variability, suggesting right atrial pressure of 3 mmHg.   Right/left heart cath 11/04/2020: 1. Very large and normal coronary arteries. 2. Normal LV filling pressures. 3. Normal right heart pressures.  4. Reduced cardiac output. Index 2.26.  )     Anesthesia Quick Evaluation

## 2021-11-22 ENCOUNTER — Inpatient Hospital Stay (HOSPITAL_COMMUNITY)
Admission: RE | Disposition: A | Discharge status: Home / Self Care | Payer: Self-pay | Source: Home / Self Care | Attending: Neurological Surgery

## 2021-11-22 ENCOUNTER — Inpatient Hospital Stay (HOSPITAL_COMMUNITY): Payer: Medicaid Other | Admitting: Physician Assistant

## 2021-11-22 ENCOUNTER — Other Ambulatory Visit: Payer: Self-pay

## 2021-11-22 ENCOUNTER — Inpatient Hospital Stay (HOSPITAL_COMMUNITY)
Admission: RE | Admit: 2021-11-22 | Discharge: 2021-11-25 | DRG: 614 | Disposition: A | Discharge status: Home / Self Care | Payer: Medicaid Other | Attending: Neurological Surgery | Admitting: Neurological Surgery

## 2021-11-22 ENCOUNTER — Inpatient Hospital Stay (HOSPITAL_COMMUNITY): Payer: Medicaid Other

## 2021-11-22 ENCOUNTER — Encounter (HOSPITAL_COMMUNITY): Payer: Self-pay | Admitting: Neurological Surgery

## 2021-11-22 DIAGNOSIS — I82461 Acute embolism and thrombosis of right calf muscular vein: Secondary | ICD-10-CM | POA: Diagnosis not present

## 2021-11-22 DIAGNOSIS — R42 Dizziness and giddiness: Secondary | ICD-10-CM | POA: Diagnosis not present

## 2021-11-22 DIAGNOSIS — I110 Hypertensive heart disease with heart failure: Secondary | ICD-10-CM

## 2021-11-22 DIAGNOSIS — E236 Other disorders of pituitary gland: Secondary | ICD-10-CM | POA: Diagnosis not present

## 2021-11-22 DIAGNOSIS — E893 Postprocedural hypopituitarism: Principal | ICD-10-CM

## 2021-11-22 DIAGNOSIS — J342 Deviated nasal septum: Secondary | ICD-10-CM | POA: Diagnosis present

## 2021-11-22 DIAGNOSIS — Z7901 Long term (current) use of anticoagulants: Secondary | ICD-10-CM

## 2021-11-22 DIAGNOSIS — I82451 Acute embolism and thrombosis of right peroneal vein: Secondary | ICD-10-CM | POA: Diagnosis not present

## 2021-11-22 DIAGNOSIS — R112 Nausea with vomiting, unspecified: Secondary | ICD-10-CM | POA: Diagnosis not present

## 2021-11-22 DIAGNOSIS — I4891 Unspecified atrial fibrillation: Secondary | ICD-10-CM

## 2021-11-22 DIAGNOSIS — H5347 Heteronymous bilateral field defects: Secondary | ICD-10-CM | POA: Diagnosis present

## 2021-11-22 DIAGNOSIS — Z01818 Encounter for other preprocedural examination: Secondary | ICD-10-CM

## 2021-11-22 DIAGNOSIS — Z825 Family history of asthma and other chronic lower respiratory diseases: Secondary | ICD-10-CM

## 2021-11-22 DIAGNOSIS — I509 Heart failure, unspecified: Secondary | ICD-10-CM

## 2021-11-22 DIAGNOSIS — M7989 Other specified soft tissue disorders: Secondary | ICD-10-CM | POA: Diagnosis not present

## 2021-11-22 DIAGNOSIS — I10 Essential (primary) hypertension: Secondary | ICD-10-CM | POA: Diagnosis present

## 2021-11-22 DIAGNOSIS — Z9889 Other specified postprocedural states: Secondary | ICD-10-CM | POA: Diagnosis not present

## 2021-11-22 DIAGNOSIS — D352 Benign neoplasm of pituitary gland: Secondary | ICD-10-CM | POA: Diagnosis present

## 2021-11-22 HISTORY — PX: TRANSPHENOIDAL APPROACH EXPOSURE: SHX6311

## 2021-11-22 HISTORY — PX: CRANIOTOMY: SHX93

## 2021-11-22 LAB — CBC
HCT: 44.7 % (ref 39.0–52.0)
Hemoglobin: 14.5 g/dL (ref 13.0–17.0)
MCH: 27.8 pg (ref 26.0–34.0)
MCHC: 32.4 g/dL (ref 30.0–36.0)
MCV: 85.6 fL (ref 80.0–100.0)
Platelets: 232 10*3/uL (ref 150–400)
RBC: 5.22 MIL/uL (ref 4.22–5.81)
RDW: 13.6 % (ref 11.5–15.5)
WBC: 7 10*3/uL (ref 4.0–10.5)
nRBC: 0 % (ref 0.0–0.2)

## 2021-11-22 LAB — BASIC METABOLIC PANEL
Anion gap: 8 (ref 5–15)
BUN: 19 mg/dL (ref 8–23)
CO2: 28 mmol/L (ref 22–32)
Calcium: 9.8 mg/dL (ref 8.9–10.3)
Chloride: 103 mmol/L (ref 98–111)
Creatinine, Ser: 1.29 mg/dL — ABNORMAL HIGH (ref 0.61–1.24)
GFR, Estimated: >60 mL/min (ref >60)
Glucose, Bld: 90 mg/dL (ref 70–99)
Potassium: 4.1 mmol/L (ref 3.5–5.1)
Sodium: 139 mmol/L (ref 135–145)

## 2021-11-22 LAB — GLUCOSE, CAPILLARY: Glucose-Capillary: 80 mg/dL (ref 70–99)

## 2021-11-22 LAB — ABO/RH: ABO/RH(D): A POS

## 2021-11-22 SURGERY — CRANIOTOMY HYPOPHYSECTOMY TRANSNASAL APPROACH
Anesthesia: General

## 2021-11-22 MED ORDER — POLYETHYLENE GLYCOL 3350 17 G PO PACK: 17.0000 g | PACK | Freq: Every day | ORAL | Status: DC | PRN

## 2021-11-22 MED ORDER — ACETAMINOPHEN 325 MG PO TABS
650.0000 mg | ORAL_TABLET | ORAL | Status: DC | PRN
  Administered 2021-11-23 – 2021-11-25 (×5): 650 mg via ORAL
  Filled 2021-11-22 (×5): qty 2

## 2021-11-22 MED ORDER — FAMOTIDINE IN NACL 20-0.9 MG/50ML-% IV SOLN
20.0000 mg | Freq: Two times a day (BID) | INTRAVENOUS | Status: DC
Start: 2021-11-22 — End: 2021-11-23
  Administered 2021-11-22 – 2021-11-23 (×3): 20 mg via INTRAVENOUS
  Filled 2021-11-22 (×3): qty 50

## 2021-11-22 MED ORDER — HYDROCODONE-ACETAMINOPHEN 5-325 MG PO TABS
1.0000 | ORAL_TABLET | ORAL | Status: DC | PRN
  Administered 2021-11-22 – 2021-11-25 (×9): 1 via ORAL
  Filled 2021-11-22 (×9): qty 1

## 2021-11-22 MED ORDER — FENTANYL CITRATE (PF) 250 MCG/5ML IJ SOLN
INTRAMUSCULAR | Status: AC
  Filled 2021-11-22: qty 5

## 2021-11-22 MED ORDER — SPIRONOLACTONE 12.5 MG HALF TABLET
12.5000 mg | ORAL_TABLET | Freq: Every day | ORAL | Status: DC
Start: 2021-11-22 — End: 2021-11-25
  Administered 2021-11-22 – 2021-11-24 (×3): 12.5 mg via ORAL
  Filled 2021-11-22 (×5): qty 1

## 2021-11-22 MED ORDER — SUGAMMADEX SODIUM 200 MG/2ML IV SOLN
INTRAVENOUS | Status: DC | PRN
  Administered 2021-11-22: 200 mg via INTRAVENOUS

## 2021-11-22 MED ORDER — CHLORHEXIDINE GLUCONATE CLOTH 2 % EX PADS
6.0000 | MEDICATED_PAD | Freq: Every day | CUTANEOUS | Status: DC
  Administered 2021-11-22 – 2021-11-25 (×2): 6 via TOPICAL

## 2021-11-22 MED ORDER — LIDOCAINE 2% (20 MG/ML) 5 ML SYRINGE
INTRAMUSCULAR | Status: AC
  Filled 2021-11-22: qty 5

## 2021-11-22 MED ORDER — HEMOSTATIC AGENTS (NO CHARGE) OPTIME
TOPICAL | Status: DC | PRN
  Administered 2021-11-22: 1 via TOPICAL

## 2021-11-22 MED ORDER — CHLORHEXIDINE GLUCONATE 0.12 % MT SOLN
15.0000 mL | Freq: Once | OROMUCOSAL | Status: AC
  Administered 2021-11-22: 15 mL via OROMUCOSAL
  Filled 2021-11-22: qty 15

## 2021-11-22 MED ORDER — PROPOFOL 10 MG/ML IV BOLUS
INTRAVENOUS | Status: DC | PRN
  Administered 2021-11-22: 140 mg via INTRAVENOUS

## 2021-11-22 MED ORDER — SODIUM CHLORIDE 0.9 % IV SOLN: INTRAVENOUS | Status: DC | PRN

## 2021-11-22 MED ORDER — LIDOCAINE 2% (20 MG/ML) 5 ML SYRINGE
INTRAMUSCULAR | Status: DC | PRN
  Administered 2021-11-22: 80 mg via INTRAVENOUS

## 2021-11-22 MED ORDER — OXYMETAZOLINE HCL 0.05 % NA SOLN
NASAL | Status: DC | PRN
  Administered 2021-11-22: 1

## 2021-11-22 MED ORDER — LIDOCAINE-EPINEPHRINE 1 %-1:100000 IJ SOLN
INTRAMUSCULAR | Status: AC
  Filled 2021-11-22: qty 1

## 2021-11-22 MED ORDER — PHENYLEPHRINE HCL-NACL 20-0.9 MG/250ML-% IV SOLN
INTRAVENOUS | Status: DC | PRN
  Administered 2021-11-22: 30 ug/min via INTRAVENOUS

## 2021-11-22 MED ORDER — CEFAZOLIN SODIUM-DEXTROSE 2-4 GM/100ML-% IV SOLN
2.0000 g | INTRAVENOUS | Status: AC
  Administered 2021-11-22: 2 g via INTRAVENOUS
  Filled 2021-11-22: qty 100

## 2021-11-22 MED ORDER — THROMBIN 5000 UNITS EX SOLR
OROMUCOSAL | Status: DC | PRN
  Administered 2021-11-22: 5 mL via TOPICAL

## 2021-11-22 MED ORDER — MIDAZOLAM HCL 2 MG/2ML IJ SOLN
INTRAMUSCULAR | Status: AC
  Filled 2021-11-22: qty 2

## 2021-11-22 MED ORDER — ROCURONIUM BROMIDE 10 MG/ML (PF) SYRINGE
PREFILLED_SYRINGE | INTRAVENOUS | Status: AC
  Filled 2021-11-22: qty 10

## 2021-11-22 MED ORDER — MIDAZOLAM HCL 2 MG/2ML IJ SOLN
INTRAMUSCULAR | Status: DC | PRN
  Administered 2021-11-22 (×2): 1 mg via INTRAVENOUS

## 2021-11-22 MED ORDER — ATORVASTATIN CALCIUM 40 MG PO TABS
40.0000 mg | ORAL_TABLET | Freq: Every day | ORAL | Status: DC
  Administered 2021-11-23 – 2021-11-24 (×2): 40 mg via ORAL
  Filled 2021-11-22 (×2): qty 1

## 2021-11-22 MED ORDER — PROPOFOL 10 MG/ML IV BOLUS
INTRAVENOUS | Status: AC
Start: 2021-11-22 — End: ?
  Filled 2021-11-22: qty 20

## 2021-11-22 MED ORDER — SODIUM CHLORIDE 0.9 % IR SOLN
Status: DC | PRN
  Administered 2021-11-22: 2000 mL

## 2021-11-22 MED ORDER — LIDOCAINE-EPINEPHRINE 1 %-1:100000 IJ SOLN
INTRAMUSCULAR | Status: DC | PRN
  Administered 2021-11-22: 9 mL

## 2021-11-22 MED ORDER — OXYMETAZOLINE HCL 0.05 % NA SOLN
NASAL | Status: AC
Start: 2021-11-22 — End: ?
  Filled 2021-11-22: qty 30

## 2021-11-22 MED ORDER — CHLORHEXIDINE GLUCONATE CLOTH 2 % EX PADS: 6.0000 | MEDICATED_PAD | Freq: Once | CUTANEOUS | Status: DC

## 2021-11-22 MED ORDER — DEXAMETHASONE SODIUM PHOSPHATE 10 MG/ML IJ SOLN
INTRAMUSCULAR | Status: DC | PRN
  Administered 2021-11-22: 10 mg via INTRAVENOUS

## 2021-11-22 MED ORDER — PROMETHAZINE HCL 25 MG PO TABS
12.5000 mg | ORAL_TABLET | ORAL | Status: DC | PRN
  Administered 2021-11-23 (×3): 25 mg via ORAL
  Filled 2021-11-22 (×3): qty 1

## 2021-11-22 MED ORDER — ACETAMINOPHEN 500 MG PO TABS
1000.0000 mg | ORAL_TABLET | Freq: Once | ORAL | Status: AC
  Administered 2021-11-22: 1000 mg via ORAL
  Filled 2021-11-22: qty 2

## 2021-11-22 MED ORDER — PROPOFOL 500 MG/50ML IV EMUL
INTRAVENOUS | Status: DC | PRN
  Administered 2021-11-22: 25 ug/kg/min via INTRAVENOUS

## 2021-11-22 MED ORDER — SODIUM CHLORIDE 0.9 % IV SOLN
0.1500 ug/kg/min | INTRAVENOUS | Status: AC
  Administered 2021-11-22: .15 ug/kg/min via INTRAVENOUS
  Filled 2021-11-22: qty 2000

## 2021-11-22 MED ORDER — TRIAMCINOLONE ACETONIDE 40 MG/ML IJ SUSP
INTRAMUSCULAR | Status: AC
Start: 2021-11-22 — End: ?
  Filled 2021-11-22: qty 5

## 2021-11-22 MED ORDER — SALINE SPRAY 0.65 % NA SOLN: 4.0000 | NASAL | Status: DC | PRN

## 2021-11-22 MED ORDER — THROMBIN (RECOMBINANT) 5000 UNITS EX SOLR
CUTANEOUS | Status: DC | PRN
  Administered 2021-11-22: 10 mL via TOPICAL

## 2021-11-22 MED ORDER — ORAL CARE MOUTH RINSE: 15.0000 mL | OROMUCOSAL | Status: DC | PRN

## 2021-11-22 MED ORDER — FENTANYL CITRATE (PF) 250 MCG/5ML IJ SOLN
INTRAMUSCULAR | Status: DC | PRN
  Administered 2021-11-22: 150 ug via INTRAVENOUS

## 2021-11-22 MED ORDER — ONDANSETRON HCL 4 MG/2ML IJ SOLN
4.0000 mg | INTRAMUSCULAR | Status: DC | PRN
Start: 2021-11-22 — End: 2021-11-25
  Administered 2021-11-22 – 2021-11-23 (×3): 4 mg via INTRAVENOUS
  Filled 2021-11-22 (×3): qty 2

## 2021-11-22 MED ORDER — MUPIROCIN CALCIUM 2 % EX CREA
TOPICAL_CREAM | CUTANEOUS | Status: AC
  Filled 2021-11-22: qty 15

## 2021-11-22 MED ORDER — 0.9 % SODIUM CHLORIDE (POUR BTL) OPTIME
TOPICAL | Status: DC | PRN
  Administered 2021-11-22: 1000 mL

## 2021-11-22 MED ORDER — OXYMETAZOLINE HCL 0.05 % NA SOLN
NASAL | Status: DC | PRN
  Administered 2021-11-22: 2 via NASAL

## 2021-11-22 MED ORDER — FENTANYL CITRATE (PF) 100 MCG/2ML IJ SOLN: 25.0000 ug | INTRAMUSCULAR | Status: DC | PRN

## 2021-11-22 MED ORDER — LACTATED RINGERS IV SOLN: INTRAVENOUS | Status: DC

## 2021-11-22 MED ORDER — ARTIFICIAL TEARS OPHTHALMIC OINT
TOPICAL_OINTMENT | OPHTHALMIC | Status: AC
Start: 2021-11-22 — End: ?
  Filled 2021-11-22: qty 3.5

## 2021-11-22 MED ORDER — LOSARTAN POTASSIUM 50 MG PO TABS
25.0000 mg | ORAL_TABLET | Freq: Every day | ORAL | Status: DC
  Administered 2021-11-23 – 2021-11-25 (×3): 25 mg via ORAL
  Filled 2021-11-22 (×3): qty 1

## 2021-11-22 MED ORDER — PROPOFOL 10 MG/ML IV BOLUS
INTRAVENOUS | Status: AC
  Filled 2021-11-22: qty 20

## 2021-11-22 MED ORDER — ORAL CARE MOUTH RINSE: 15.0000 mL | Freq: Once | OROMUCOSAL | Status: AC

## 2021-11-22 MED ORDER — CEFAZOLIN SODIUM-DEXTROSE 2-4 GM/100ML-% IV SOLN
2.0000 g | Freq: Three times a day (TID) | INTRAVENOUS | Status: AC
  Administered 2021-11-22 – 2021-11-23 (×2): 2 g via INTRAVENOUS
  Filled 2021-11-22 (×2): qty 100

## 2021-11-22 MED ORDER — ONDANSETRON HCL 4 MG/2ML IJ SOLN
INTRAMUSCULAR | Status: AC
  Filled 2021-11-22: qty 2

## 2021-11-22 MED ORDER — HEPARIN SODIUM (PORCINE) 5000 UNIT/ML IJ SOLN
5000.0000 [IU] | Freq: Three times a day (TID) | INTRAMUSCULAR | Status: DC
Start: 2021-11-24 — End: 2021-11-24
  Administered 2021-11-24: 5000 [IU] via SUBCUTANEOUS
  Filled 2021-11-22: qty 1

## 2021-11-22 MED ORDER — THROMBIN 5000 UNITS EX SOLR
CUTANEOUS | Status: AC
Start: 2021-11-22 — End: ?
  Filled 2021-11-22: qty 15000

## 2021-11-22 MED ORDER — DOCUSATE SODIUM 100 MG PO CAPS
100.0000 mg | ORAL_CAPSULE | Freq: Two times a day (BID) | ORAL | Status: DC
  Administered 2021-11-22 – 2021-11-25 (×6): 100 mg via ORAL
  Filled 2021-11-22 (×6): qty 1

## 2021-11-22 MED ORDER — ONDANSETRON HCL 4 MG/2ML IJ SOLN
INTRAMUSCULAR | Status: DC | PRN
  Administered 2021-11-22: 4 mg via INTRAVENOUS

## 2021-11-22 MED ORDER — CEPHALEXIN 500 MG PO CAPS: 500.0000 mg | ORAL_CAPSULE | Freq: Three times a day (TID) | ORAL | 0 refills | Status: AC

## 2021-11-22 MED ORDER — METOPROLOL SUCCINATE ER 25 MG PO TB24
25.0000 mg | ORAL_TABLET | Freq: Every day | ORAL | Status: DC
  Administered 2021-11-23 – 2021-11-25 (×3): 25 mg via ORAL
  Filled 2021-11-22 (×3): qty 1

## 2021-11-22 MED ORDER — ACETAMINOPHEN 650 MG RE SUPP: 650.0000 mg | RECTAL | Status: DC | PRN

## 2021-11-22 MED ORDER — ONDANSETRON HCL 4 MG/2ML IJ SOLN: 4.0000 mg | Freq: Once | INTRAMUSCULAR | Status: DC | PRN

## 2021-11-22 MED ORDER — CEPHALEXIN 500 MG PO CAPS
500.0000 mg | ORAL_CAPSULE | Freq: Three times a day (TID) | ORAL | Status: DC
  Administered 2021-11-23 – 2021-11-25 (×8): 500 mg via ORAL
  Filled 2021-11-22 (×9): qty 1

## 2021-11-22 MED ORDER — LABETALOL HCL 5 MG/ML IV SOLN: 10.0000 mg | INTRAVENOUS | Status: DC | PRN

## 2021-11-22 MED ORDER — HYDROMORPHONE HCL 1 MG/ML IJ SOLN
0.5000 mg | INTRAMUSCULAR | Status: DC | PRN
  Administered 2021-11-22 (×2): 0.5 mg via INTRAVENOUS
  Filled 2021-11-22 (×2): qty 1

## 2021-11-22 MED ORDER — DAPAGLIFLOZIN PROPANEDIOL 10 MG PO TABS
10.0000 mg | ORAL_TABLET | Freq: Every day | ORAL | Status: DC
  Administered 2021-11-23 – 2021-11-25 (×3): 10 mg via ORAL
  Filled 2021-11-22 (×3): qty 1

## 2021-11-22 MED ORDER — DEXAMETHASONE SODIUM PHOSPHATE 10 MG/ML IJ SOLN
INTRAMUSCULAR | Status: AC
Start: 2021-11-22 — End: ?
  Filled 2021-11-22: qty 1

## 2021-11-22 MED ORDER — ROCURONIUM BROMIDE 10 MG/ML (PF) SYRINGE
PREFILLED_SYRINGE | INTRAVENOUS | Status: DC | PRN
  Administered 2021-11-22: 60 mg via INTRAVENOUS
  Administered 2021-11-22: 40 mg via INTRAVENOUS

## 2021-11-22 MED ORDER — ONDANSETRON HCL 4 MG PO TABS: 4.0000 mg | ORAL_TABLET | ORAL | Status: DC | PRN

## 2021-11-22 SURGICAL SUPPLY — 109 items
BAG COUNTER SPONGE SURGICOUNT (BAG) ×4 IMPLANT
BAND RUBBER #18 3X1/16 STRL (MISCELLANEOUS) ×2 IMPLANT
BENZOIN TINCTURE PRP APPL 2/3 (GAUZE/BANDAGES/DRESSINGS) ×1 IMPLANT
BLADE RAD40 ROTATE 4M 4 5PK (BLADE) IMPLANT
BLADE ROTATE TRICUT 4X13 M4 (BLADE) ×2 IMPLANT
BLADE SURG 10 STRL SS (BLADE) ×1 IMPLANT
BLADE SURG 11 STRL SS (BLADE) ×3 IMPLANT
BLADE SURG 15 STRL LF DISP TIS (BLADE) ×2 IMPLANT
BLADE SURG 15 STRL SS (BLADE) ×1
BUR DIAMOND 13X5 70D (BURR) IMPLANT
BUR DIAMOND CURV 15X5 15D (BURR) IMPLANT
BUR TAPER CHOANAL ATRESIA 30K (BURR) ×1 IMPLANT
CABLE BIPOLOR RESECTION CORD (MISCELLANEOUS) ×2 IMPLANT
CANISTER SUCT 3000ML PPV (MISCELLANEOUS) ×6 IMPLANT
CATH LUMBAR HERMETIC 14G (CATHETERS) ×1 IMPLANT
CATHETER LUMBAR HERMETIC 14G (CATHETERS)
COAGULATOR SUCT 8FR VV (MISCELLANEOUS) ×1 IMPLANT
COAGULATOR SUCT SWTCH 10FR 6 (ELECTROSURGICAL) IMPLANT
COVER BACK TABLE 60X90IN (DRAPES) IMPLANT
COVER MAYO STAND STRL (DRAPES) ×3 IMPLANT
DRAPE HALF SHEET 40X57 (DRAPES) ×3 IMPLANT
DRAPE INCISE IOBAN 66X45 STRL (DRAPES) ×2 IMPLANT
DRAPE MICROSCOPE LEICA (MISCELLANEOUS) IMPLANT
DRAPE SURG 17X23 STRL (DRAPES) ×3 IMPLANT
DRESSING NASAL POPE 10X1.5X2.5 (GAUZE/BANDAGES/DRESSINGS) IMPLANT
DRSG NASAL POPE 10X1.5X2.5 (GAUZE/BANDAGES/DRESSINGS)
DURAPREP 26ML APPLICATOR (WOUND CARE) ×2 IMPLANT
ELECT COATED BLADE 2.86 ST (ELECTRODE) IMPLANT
ELECT NDL TIP 2.8 STRL (NEEDLE) ×1 IMPLANT
ELECT NEEDLE TIP 2.8 STRL (NEEDLE) IMPLANT
ELECT REM PT RETURN 9FT ADLT (ELECTROSURGICAL) ×2
ELECTRODE REM PT RTRN 9FT ADLT (ELECTROSURGICAL) ×2 IMPLANT
GAUZE PACKING FOLDED 2  STR (GAUZE/BANDAGES/DRESSINGS)
GAUZE PACKING FOLDED 2 STR (GAUZE/BANDAGES/DRESSINGS) ×1 IMPLANT
GAUZE SPONGE 2X2 8PLY STRL LF (GAUZE/BANDAGES/DRESSINGS) ×2 IMPLANT
GAUZE SPONGE 4X4 12PLY STRL (GAUZE/BANDAGES/DRESSINGS) ×2 IMPLANT
GLOVE BIOGEL M 7.0 STRL (GLOVE) ×4 IMPLANT
GLOVE BIOGEL PI IND STRL 7.5 (GLOVE) ×1 IMPLANT
GLOVE BIOGEL PI INDICATOR 7.5 (GLOVE) ×1
GLOVE ECLIPSE 7.5 STRL STRAW (GLOVE) ×2 IMPLANT
GLOVE EXAM NITRILE LRG STRL (GLOVE) IMPLANT
GLOVE EXAM NITRILE XL STR (GLOVE) IMPLANT
GLOVE EXAM NITRILE XS STR PU (GLOVE) IMPLANT
GLOVE SURG LTX SZ7.5 (GLOVE) ×2 IMPLANT
GOWN STRL REUS W/ TWL LRG LVL3 (GOWN DISPOSABLE) ×2 IMPLANT
GOWN STRL REUS W/ TWL XL LVL3 (GOWN DISPOSABLE) IMPLANT
GOWN STRL REUS W/TWL 2XL LVL3 (GOWN DISPOSABLE) ×1 IMPLANT
GOWN STRL REUS W/TWL LRG LVL3 (GOWN DISPOSABLE) ×3
GOWN STRL REUS W/TWL XL LVL3 (GOWN DISPOSABLE) ×2
GRAFT DURAGEN MATRIX 1WX1L (Tissue) ×1 IMPLANT
HEMOSTAT POWDER KIT SURGIFOAM (HEMOSTASIS) ×2 IMPLANT
HEMOSTAT SURGICEL 2X14 (HEMOSTASIS) IMPLANT
KIT BASIN OR (CUSTOM PROCEDURE TRAY) ×4 IMPLANT
KIT DRAIN CSF ACCUDRAIN (MISCELLANEOUS) ×1 IMPLANT
KIT TURNOVER KIT B (KITS) ×4 IMPLANT
KNIFE ARACHNOID DISP AM-21-S (BLADE) IMPLANT
NDL HYPO 18GX1.5 BLUNT FILL (NEEDLE) ×1 IMPLANT
NDL HYPO 25GX1X1/2 BEV (NEEDLE) ×1 IMPLANT
NDL HYPO 25X1 1.5 SAFETY (NEEDLE) ×1 IMPLANT
NDL SPNL 22GX3.5 QUINCKE BK (NEEDLE) ×1 IMPLANT
NDL SPNL 25GX3.5 QUINCKE BL (NEEDLE) ×1 IMPLANT
NEEDLE HYPO 18GX1.5 BLUNT FILL (NEEDLE) IMPLANT
NEEDLE HYPO 25GX1X1/2 BEV (NEEDLE) ×2 IMPLANT
NEEDLE HYPO 25X1 1.5 SAFETY (NEEDLE) ×2 IMPLANT
NEEDLE SPNL 22GX3.5 QUINCKE BK (NEEDLE) IMPLANT
NEEDLE SPNL 25GX3.5 QUINCKE BL (NEEDLE) ×2 IMPLANT
NS IRRIG 1000ML POUR BTL (IV SOLUTION) ×3 IMPLANT
PAD ARMBOARD 7.5X6 YLW CONV (MISCELLANEOUS) ×6 IMPLANT
PATTIES SURGICAL .25X.25 (GAUZE/BANDAGES/DRESSINGS) IMPLANT
PATTIES SURGICAL .5 X.5 (GAUZE/BANDAGES/DRESSINGS) ×2 IMPLANT
PATTIES SURGICAL .5 X3 (DISPOSABLE) ×2 IMPLANT
PENCIL BUTTON HOLSTER BLD 10FT (ELECTRODE) ×1 IMPLANT
PROBE FOR NEUROSURGERY (MISCELLANEOUS) IMPLANT
SEALANT ADHERUS EXTEND TIP (MISCELLANEOUS) ×1 IMPLANT
SHEATH ENDOSCRUB 0 DEG (SHEATH) ×2 IMPLANT
SHEATH ENDOSCRUB 30 DEG (SHEATH) IMPLANT
SHEATH ENDOSCRUB 45 DEG (SHEATH) IMPLANT
SPECIMEN JAR SMALL (MISCELLANEOUS) IMPLANT
SPLINT NASAL DOYLE BI-VL (GAUZE/BANDAGES/DRESSINGS) ×1 IMPLANT
SPLINT NASAL POSISEP X2 .8X2.3 (GAUZE/BANDAGES/DRESSINGS) ×1 IMPLANT
SPONGE GAUZE 2X2 STER 10/PKG (GAUZE/BANDAGES/DRESSINGS)
SPONGE NEURO XRAY DETECT 1X3 (DISPOSABLE) ×2 IMPLANT
SPONGE SURGIFOAM ABS GEL SZ50 (HEMOSTASIS) ×1 IMPLANT
SPONGE T-LAP 4X18 ~~LOC~~+RFID (SPONGE) ×1 IMPLANT
STAPLER SKIN PROX WIDE 3.9 (STAPLE) ×1 IMPLANT
STRIP CLOSURE SKIN 1/2X4 (GAUZE/BANDAGES/DRESSINGS) ×1 IMPLANT
SUT 5.0 PDS RB-1 (SUTURE)
SUT BONE WAX W31G (SUTURE) ×2 IMPLANT
SUT ETHILON 3 0 FSL (SUTURE) IMPLANT
SUT ETHILON 3 0 PS 1 (SUTURE) ×1 IMPLANT
SUT ETHILON 6 0 P 1 (SUTURE) IMPLANT
SUT PDS AB 4-0 RB1 27 (SUTURE) IMPLANT
SUT PDS PLUS AB 5-0 RB-1 (SUTURE) IMPLANT
SUT PLAIN 4 0 ~~LOC~~ 1 (SUTURE) ×1 IMPLANT
SUT VIC AB 4-0 P-3 18X BRD (SUTURE) IMPLANT
SUT VIC AB 4-0 P3 18 (SUTURE)
SYR CONTROL 10ML LL (SYRINGE) ×2 IMPLANT
TOWEL GREEN STERILE (TOWEL DISPOSABLE) ×2 IMPLANT
TOWEL GREEN STERILE FF (TOWEL DISPOSABLE) ×4 IMPLANT
TRACKER ENT INSTRUMENT (MISCELLANEOUS) ×2 IMPLANT
TRACKER ENT PATIENT (MISCELLANEOUS) ×2 IMPLANT
TRAP SPECIMEN MUCUS 40CC (MISCELLANEOUS) ×1 IMPLANT
TRAY ENT MC OR (CUSTOM PROCEDURE TRAY) ×3 IMPLANT
TRAY FOLEY MTR SLVR 16FR STAT (SET/KITS/TRAYS/PACK) ×2 IMPLANT
TUBE CONNECTING 12X1/4 (SUCTIONS) ×2 IMPLANT
TUBE SALEM SUMP 16 FR W/ARV (TUBING) ×2 IMPLANT
TUBING EXTENTION W/L.L. (IV SETS) ×2 IMPLANT
TUBING STRAIGHTSHOT EPS 5PK (TUBING) ×2 IMPLANT
WATER STERILE IRR 1000ML POUR (IV SOLUTION) ×2 IMPLANT

## 2021-11-22 NOTE — Op Note (Signed)
Operative Note:  ENDOSCOPIC TRANSSPHENOIDAL PITUITARY RESECTION WITH NAVIGATION      Patient: Hunter Miller  Medical record number: [id number]  Date:11/22/2021  Pre-operative Indications: 1.  Pituitary Mass       Postoperative Indications: Same  Surgical Procedure: 1. Endoscopic transsphenoidal pituitary resection with intraoperative navigation          Anesthesia: GET  Surgeon: Delsa Bern, M.D.  Neurosurgeon: Emelda Brothers, MD  Complications: None  EBL: 200 cc  Findings: Normal sinonasal anatomy with mild deviated nasal septum, nonobstructive.  Posi-Sep X packing placed in the sphenoid sinus.  Note: The neurosurgical component of the operative procedure is dictated as a separate operative note.   Brief History: The patient is a 61 y.o. male with a history of pituitary mass. The patient has a history of progressive visual loss. The patient was referred to Dr. Zada Finders for neurosurgical evaluation.  Patient seen by me at [facility] preoperatively with review of nasal anatomy and sinus CT scan for navigation.  Given the patient's history and findings, the above surgical procedures were recommended, risks and benefits were discussed in detail with the patient.  They understand and agree with our plan for surgery which is scheduled at [facility] under general anesthesia.  Surgical Procedure: The patient is brought to the neurosurgical operating room on 11/22/2021 and placed in supine position on the operating table. General endotracheal anesthesia was established without difficulty. When the patient was adequately anesthetized, surgical timeout was performed with correct identification of the patient and the surgical procedure. The patient's nose was then injected with 9 cc of 1% lidocaine 1:100,000 dilution epinephrine which was injected in a submucosal fashion. The patient's nose was then packed with Afrin-soaked cottonoid pledgets were left in place for  approximately 10 minutes to allow for vasoconstriction and hemostasis.  The Xomed Fusion navigation headgear was applied and anatomic and surgical landmarks were identified and confirmed, navigation was used throughout the sinus component of the surgical procedure.  With the patient prepped draped and prepared for surgery, nasal endoscopy was performed on the patient's right.  The middle turbinate was carefully lateralized to allow access to the posterior aspect of the nasal passageway.  The right sphenoid sinus ostium was identified.  The inferior aspect of the superior turbinate was then resected with through-cutting forceps and a microdebrider.  The right sphenoid sinus ostium was enlarged in a superior and lateral direction using the microdebrider and through-cutting forceps to create a widely patent ostium.  Nasal endoscopy on the patient's left-hand side was then undertaken.  The left middle turbinate was lateralized and the posterior nasal cavity was visualized with identification of the left sphenoid sinus ostium using navigation.  The inferior aspect of the superior turbinate was resected and the sinus ostium was enlarged in the lateral and superior direction to create a wide sphenoid sinus ostium.  A posterior septectomy was then performed with a Surveyor, quantity.  Bone, cartilage and soft tissue was then resected to create a wide posterior septotomy.  The anterior face of the sphenoid sinus and sphenoid sinus septum were then resected with a combination of through-cutting forceps, osteotome and microdebrider to allow direct access to the entire posterior aspect of the sphenoid sinus and pituitary fossa.  Sphenoid sinus mucosa overlying the pituitary fossa was elevated and lateralized.  The anterior face of the pituitary fossa was demarcated using navigation.  With adequate access to the pituitary fossa the neurosurgical component of the procedure was begun by Dr. Dr.  Ostergard.  This is dictated as a  separate operative report.  Resection of the pituitary tumor was undertaken using direct visualization of the 0 degree endoscope, navigation and blunt and sharp dissection.  With pituitary tumor resection completed, reconstruction was undertaken.  Anterior pituitary defect closed with DuraGen followed by Adheris dural glue.  Surgicel was placed over the pituitary fossa defect and the sphenoid sinus was loosely packed with Posi-Sep X absorbable nasal packing.  There was no active bleeding and no evidence of spinal fluid leak.  The sphenoid sinus was carefully inspected, no further bleeding along the mucosal margins, sphenoidotomy sites or posterior septectomy.  The patient's nasal cavity was irrigated and suctioned.  Surgical sponge count was correct. An oral gastric tube was passed and the stomach contents were aspirated. Patient was awakened from anesthetic and transferred from the operating room to the recovery room in stable condition. There were no complications and blood loss was 200 cc.   Delsa Bern, M.D. [facility] 11/22/2021

## 2021-11-22 NOTE — H&P (Signed)
Surgical H&P Update  HPI: 61 y.o. with a history of visual loss, large pituitary adenoma, non-functioning. No changes in health since they were last seen. Still having the above and wishes to proceed with surgery.  PMHx:  Past Medical History:  Diagnosis Date   Dysrhythmia    A-fib   HTN (hypertension)    PONV (postoperative nausea and vomiting)    FamHx:  Family History  Problem Relation Age of Onset   Asthma Father    COPD Father    SocHx:  reports that he has never smoked. He has never used smokeless tobacco. He reports that he does not drink alcohol and does not use drugs.  Physical Exam: Aox3, PERRL, EOMI, FS & SS, VF with bitemporal hemianopsia on confrontation Strength 5/5 x4 and SILTx4   Assesment/Plan: 61 y.o. man with large pituitary adenoma, here for endonasal resection. Risks, benefits, and alternatives discussed and the patient would like to continue with surgery.  -OR today -4N post-op  Judith Part, MD 11/22/21 7:26 AM

## 2021-11-22 NOTE — Anesthesia Procedure Notes (Signed)
Procedure Name: Intubation Date/Time: 11/22/2021 8:53 AM  Performed by: Lowella Dell, CRNAPre-anesthesia Checklist: Patient identified, Emergency Drugs available, Suction available and Patient being monitored Patient Re-evaluated:Patient Re-evaluated prior to induction Oxygen Delivery Method: Circle System Utilized Preoxygenation: Pre-oxygenation with 100% oxygen Induction Type: IV induction Ventilation: Mask ventilation without difficulty Laryngoscope Size: Mac and 4 Grade View: Grade II Tube type: Oral Tube size: 7.5 mm Number of attempts: 1 Airway Equipment and Method: Stylet Placement Confirmation: ETT inserted through vocal cords under direct vision, positive ETCO2 and breath sounds checked- equal and bilateral Secured at: 22 cm Tube secured with: Tape Dental Injury: Teeth and Oropharynx as per pre-operative assessment  Comments: Placed by Laurence Spates

## 2021-11-22 NOTE — Op Note (Signed)
PATIENT: Hunter Miller  DAY OF SURGERY: 11/22/21   PRE-OPERATIVE DIAGNOSIS:  Pituitary adenoma   POST-OPERATIVE DIAGNOSIS:  Pituitary adenoma   PROCEDURE:  Endonasal endoscopic transphenoidal resection of pituitary tumor   SURGEON:  Surgeon(s) and Role:    Judith Part, MD - Co-surgeon    Jerrell Belfast, MD - Co-surgeon   ANESTHESIA: ETGA   BRIEF HISTORY: This is a 61 year old man who presented with progressive visual loss, workup showed a large pituitary tumor. I therefore recommended resection. This was discussed with the patient as well as risks, benefits, and alternatives and wished to proceed with surgical treatment.   OPERATIVE DETAIL: Dr. Wilburn Cornelia performed the nasal portion of the approach and is dictated separately.   For the neurosurgical portion of the case, Dr. Wilburn Cornelia and I used a combination of visual anatomic landmarks and frameless stereotaxy to confirm orientation and anatomy. The dura of the sella was then opened sharply with a #11 blade in a cruciate fashion. Tumor was immediately encountered and samples were taken and sent to pathology for analysis. With Dr. Victorio Palm assistance while holding the endoscope, the tumor was then resected in a deliberate fashion with bimanual technique using a combination of ring curettes, suction, and penfield #4. It was dissected laterally, then posteriorly. With a good plane developed, ringed curettes were used to remove the inferior portion of the tumor to allow the superior portion to descend into the field. The superior portion was then resected with extra attention paid to the superolateral corners as the diaphragma began to descend.  At the end of resection, the large portion of the tumor that was above the 'waist' actually came out en-bloc and peeled off the diaphragma quite nicely, thereby finishing with pulsatile diaphragma in the field. There was no evidence of CSF leakage. Hemostasis was confirmed and Adheris ([employer])  was used to fill the sella. Dr. Wilburn Cornelia then took over to complete the skull base reconstruction portion of the procedure.   EBL:  162m   DRAINS: none   SPECIMENS: Pituitary tumor   TJudith Part MD 11/22/21 8:40 AM

## 2021-11-22 NOTE — Anesthesia Postprocedure Evaluation (Signed)
Anesthesia Post Note  Patient: Hunter Miller  Procedure(s) Performed: Endoscopic endonasal resection of pituitary tumor TRANSPHENOIDAL APPROACH EXPOSURE     Patient location during evaluation: PACU Anesthesia Type: General Level of consciousness: awake and alert Pain management: pain level controlled Vital Signs Assessment: post-procedure vital signs reviewed and stable Respiratory status: spontaneous breathing, nonlabored ventilation, respiratory function stable and patient connected to nasal cannula oxygen Cardiovascular status: blood pressure returned to baseline and stable Postop Assessment: no apparent nausea or vomiting Anesthetic complications: no   No notable events documented.  Last Vitals:  Vitals:   11/22/21 1215 11/22/21 1230  BP: (!) 122/91 (!) 125/95  Pulse: 68 72  Resp: 13 19  Temp:  (!) 36.1 C  SpO2: (!) 87% 94%    Last Pain:  Vitals:   11/22/21 1215  TempSrc:   PainSc: 0-No pain                 Santa Lighter

## 2021-11-22 NOTE — Transfer of Care (Signed)
Immediate Anesthesia Transfer of Care Note  Patient: Hunter Miller  Procedure(s) Performed: Endoscopic endonasal resection of pituitary tumor TRANSPHENOIDAL APPROACH EXPOSURE  Patient Location: PACU  Anesthesia Type:General  Level of Consciousness: oriented, drowsy and patient cooperative  Airway & Oxygen Therapy: Patient Spontanous Breathing and Patient connected to face mask oxygen  Post-op Assessment: Report given to RN, Post -op Vital signs reviewed and stable and Patient moving all extremities X 4  Post vital signs: Reviewed and stable  Last Vitals:  Vitals Value Taken Time  BP 113/82 11/22/21 1205  Temp    Pulse 66 11/22/21 1208  Resp 16 11/22/21 1208  SpO2 96 % 11/22/21 1208  Vitals shown include unvalidated device data.  Last Pain:  Vitals:   11/22/21 0732  TempSrc:   PainSc: 0-No pain         Complications: No notable events documented.

## 2021-11-22 NOTE — Consult Note (Signed)
   ENT Progress Note: Procedure(s): Endoscopic endonasal resection of pituitary tumor TRANSPHENOIDAL APPROACH EXPOSURE SEPTOPLASTY   Subjective: Stable preop  Objective: Vital signs in last 24 hours: Temp:  [97.9 F (36.6 C)] 97.9 F (36.6 C) (08/02 0712) Pulse Rate:  [62] 62 (08/02 0712) Resp:  [20] 20 (08/02 0712) BP: (159)/(103) 159/103 (08/02 0712) SpO2:  [97 %] 97 % (08/02 0712) Weight:  [78.5 kg] 78.5 kg (08/02 0712) Weight change:     Intake/Output from previous day: No intake/output data recorded. Intake/Output this shift: No intake/output data recorded.  Labs: Recent Labs    11/22/21 0736  WBC 7.0  HGB 14.5  HCT 44.7  PLT 232   Recent Labs    11/22/21 0736  NA 139  K 4.1  CL 103  CO2 28  GLUCOSE 90  BUN 19  CALCIUM 9.8    Studies/Results: No results found.   PHYSICAL EXAM: Patent anterior nasal passage Mild septal deviation   Assessment/Plan: Adm for Endoscopic trans-sphenoidal pituitary resection with navigation    Hunter Miller 11/22/2021, 8:30 AM

## 2021-11-22 NOTE — Discharge Instructions (Addendum)
Sinus/Nasal Instructions:  1. Limited activity 2. Liquid and soft diet 3. May bathe and shower 4. Saline nasal spray - 4 puffs/nostril every hour while awake, begin the morning after surgery 5. Elevate Head of Bed 6. No nose blowing/Open mouth sneeze 7. Alternate Tylenol and ibuprofen every 6 hours as needed for pain.  Call [facility] for any questions regarding nasal concerns: [phone]   Follow up with Dr. Zada Finders in 2 weeks after discharge. If you do not already have a discharge appointment, please call his office at [phone] to schedule a follow up appointment. If you have any concerns or questions, please call the office and let us know.

## 2021-11-23 ENCOUNTER — Encounter (HOSPITAL_COMMUNITY): Payer: Self-pay | Admitting: Neurological Surgery

## 2021-11-23 LAB — RENAL FUNCTION PANEL
Albumin: 3.5 g/dL (ref 3.5–5.0)
Anion gap: 7 (ref 5–15)
BUN: 16 mg/dL (ref 8–23)
CO2: 23 mmol/L (ref 22–32)
Calcium: 8.6 mg/dL — ABNORMAL LOW (ref 8.9–10.3)
Chloride: 103 mmol/L (ref 98–111)
Creatinine, Ser: 1.06 mg/dL (ref 0.61–1.24)
GFR, Estimated: >60 mL/min (ref >60)
Glucose, Bld: 157 mg/dL — ABNORMAL HIGH (ref 70–99)
Phosphorus: 3.6 mg/dL (ref 2.5–4.6)
Potassium: 3.6 mmol/L (ref 3.5–5.1)
Sodium: 133 mmol/L — ABNORMAL LOW (ref 135–145)

## 2021-11-23 MED ORDER — FAMOTIDINE 20 MG PO TABS
20.0000 mg | ORAL_TABLET | Freq: Two times a day (BID) | ORAL | Status: DC
  Administered 2021-11-23 – 2021-11-25 (×4): 20 mg via ORAL
  Filled 2021-11-23 (×4): qty 1

## 2021-11-23 MED FILL — Thrombin For Soln 5000 Unit: CUTANEOUS | Qty: 2 | Status: AC

## 2021-11-23 NOTE — Evaluation (Signed)
Physical Therapy Evaluation Patient Details Name: Hunter Miller MRN: [id number] DOB: 1960-11-26 Today's Date: 11/23/2021  History of Present Illness  61 y.o. 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 PMH: dysrhythmia, HTN  Clinical Impression  Pt presents with condition above and deficits mentioned below, see PT Problem List. 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 Currently, pt is primarily limited in mobility by pain, nausea, and L visual field deficits (appears to be in the superior aspect per OT assessment). He does also display some mild balance deficits, currently relying on UE support for improved stability and safety with mobility. However, I anticipate pt will progress well as he mobilizes more, thus he will likely not need follow-up PT at d/c. Recommending a RW for now, but am hopeful and anticipate he may not need an AD by d/c. Will continue to follow acutely to maximize his return to baseline prior to d/c.      Recommendations for follow up therapy are one component of a multi-disciplinary discharge planning process, led by the attending physician.  Recommendations may be updated based on patient status, additional functional criteria and insurance authorization.  Follow Up Recommendations No PT follow up      Assistance Recommended at Discharge Intermittent Supervision/Assistance  Patient can return home with the following  A little help with bathing/dressing/bathroom;Assistance with cooking/housework;Assist for transportation;Help with stairs or ramp for entrance;A little help with walking and/or transfers    Equipment Recommendations Rolling walker (2 wheels) (vs no AD pending progress)  Recommendations for Other Services       Functional Status Assessment Patient has had a recent decline in their functional status and demonstrates the ability to make significant improvements in function in a  reasonable and predictable amount of time.     Precautions / Restrictions Precautions Precautions: Fall Precaution Comments: watch BP Restrictions Weight Bearing Restrictions: No      Mobility  Bed Mobility Overal bed mobility: Needs Assistance Bed Mobility: Supine to Sit     Supine to sit: Supervision, HOB elevated     General bed mobility comments: Extra time, HOB elevated, supervision for safety.    Transfers Overall transfer level: Needs assistance Equipment used: Rolling walker (2 wheels) Transfers: Sit to/from Stand Sit to Stand: Min guard, +2 safety/equipment           General transfer comment: Min guard assist for safety, no LOB, pt pulling up on RW. Cues provided for feet placement.    Ambulation/Gait Ambulation/Gait assistance: Min guard, +2 safety/equipment Gait Distance (Feet): 23 Feet Assistive device: Rolling walker (2 wheels), None, 1 person hand held assist Gait Pattern/deviations: Step-through pattern, Decreased stride length, Shuffle Gait velocity: reduced Gait velocity interpretation: <1.31 ft/sec, indicative of household ambulator   General Gait Details: Pt with slow, guarded gait, especially once pt progressed away from RW to 1 HHA to no UE support. Pt would reach out for furniture at times. No LOB, trunk sway noted. Pt bumped L side of RW into doorframe and took several attempts to get it unstuck.  Stairs            Wheelchair Mobility    Modified Rankin (Stroke Patients Only)       Balance Overall balance assessment: Mild deficits observed, not formally tested  Pertinent Vitals/Pain Pain Assessment Pain Assessment: 0-10 Pain Score: 7  Pain Location: head and nasal region Pain Descriptors / Indicators: Discomfort, Grimacing, Guarding Pain Intervention(s): Limited activity within patient's tolerance, Monitored during session, Repositioned, Premedicated before session     [place] expects to be discharged to:: Private residence Living Arrangements: Spouse/significant other;Children (adult daughter) Available Help at Discharge: Family Type of Home: House Home Access: Stairs to enter Entrance Stairs-Rails: Left (ascending) Entrance Stairs-Number of Steps: 3   Home Layout: One level Home Equipment: None      Prior Function Prior Level of Function : Independent/Modified Independent;Driving;Working/employed             Mobility Comments: Pt fell last time he was in a hospital with a cardiac condition. 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 Does not wear glasses. Recent vision issues were described as blurry and dots on L side.     Hand Dominance   Dominant Hand: Right    Extremity/Trunk Assessment   Upper Extremity Assessment Upper Extremity Assessment: Defer to OT evaluation    Lower Extremity Assessment Lower Extremity Assessment: Overall WFL for tasks assessed    Cervical / Trunk Assessment Cervical / Trunk Assessment: Normal  Communication   Communication: Prefers language other than English (Spanish; wife and daughter can translate)  Cognition Arousal/Alertness: Awake/alert Behavior During Therapy: WFL for tasks assessed/performed Overall Cognitive Status: Within Functional Limits for tasks assessed                                 General Comments: family confirmed no changes in memory, processing speed, cognition etc. Language barrier affecting formal assessment, but pt appears to follow commands appropriately with daughter or wife translating        General Comments General comments (skin integrity, edema, etc.): 92/68 supine, 108/72 sitting, 93/75 standing, 101/73 sitting up in chair, 103/76 sitting up in chair several min; SpO2 stable on RA; L visual field deficit noted; slight dizziness and nausea reported during session    Exercises     Assessment/Plan     PT Assessment Patient needs continued PT services  PT Problem List Decreased activity tolerance;Decreased balance;Decreased mobility       PT Treatment Interventions DME instruction;Gait training;Stair training;Functional mobility training;Therapeutic activities;Therapeutic exercise;Balance training;Neuromuscular re-education;Patient/family education    PT Goals (Current goals can be found in the Care Plan section)  Acute Rehab PT Goals Patient Stated Goal: to improve PT Goal Formulation: With patient/family Time For Goal Achievement: 11/30/21 Potential to Achieve Goals: Good    Frequency Min 3X/week     Co-evaluation PT/OT/SLP Co-Evaluation/Treatment: Yes Reason for Co-Treatment: For patient/therapist safety;To address functional/ADL transfers;Other (comment) (for pt tolerance due to pain and nausea) PT goals addressed during session: Mobility/safety with mobility;Proper use of DME;Balance         AM-PAC PT "6 Clicks" Mobility  Outcome Measure Help needed turning from your back to your side while in a flat bed without using bedrails?: A Little Help needed moving from lying on your back to sitting on the side of a flat bed without using bedrails?: A Little Help needed moving to and from a bed to a chair (including a wheelchair)?: A Little Help needed standing up from a chair using your arms (e.g., wheelchair or bedside chair)?: A Little Help needed to walk in hospital room?: A Little Help needed climbing 3-5 steps with a railing? : A  Little 6 Click Score: 18    End of Session Equipment Utilized During Treatment: Gait belt Activity Tolerance: Patient tolerated treatment well;Patient limited by pain;Other (comment) (limited by nausea) Patient left: in chair;with call bell/phone within reach;with family/visitor present Nurse Communication: Mobility status PT Visit Diagnosis: Unsteadiness on feet (R26.81);Other abnormalities of gait and mobility (R26.89);Dizziness and giddiness  (R42);Other symptoms and signs involving the nervous system (R29.898)    Time: 8757-9728 PT Time Calculation (min) (ACUTE ONLY): 27 min   Charges:   PT Evaluation $PT Eval Low Complexity: 1 Low          Moishe Spice, PT, DPT Acute Rehabilitation Services  Office: [phone]   Orvan Falconer 11/23/2021, 10:30 AM

## 2021-11-23 NOTE — TOC Progression Note (Signed)
Transition of Care [facility]) - Progression Note    Patient Details  Name: Hunter Miller MRN: [id number] Date of Birth: [birth year]  Transition of Care [facility]) CM/SW [place], RN Phone [phone]  11/23/2021, 12:42 PM  Clinical Narrative:     Transition of Care [facility]) Screening Note   Patient Details  Name: Hunter Miller Date of Birth: 1960/08/25   Transition of Care [facility]) CM/SW Contact:    Angelita Ingles, RN Phone Number: 11/23/2021, 12:43 PM    Transition of Care Department [facility]) has reviewed patient and no TOC needs have been identified at this time. We will continue to monitor patient advancement through interdisciplinary progression rounds.           Expected Discharge Plan and Services                                                 Social Determinants of Health (SDOH) Interventions    Readmission Risk Interventions     No data to display

## 2021-11-23 NOTE — Evaluation (Addendum)
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 S/p TSA for pit adenoma on 8/2. PMH: dysrhythmia with afib and HTN   Clinical Impression   PTA, pt was living with his wife and daughter and was independent with ADLs and IADLs; stopped working ~ a year ago. Pt performing at Echo guard A level for ADLs and functional mobility. Presenting with decreased smooth visual tracking and possible left visual field cut (upper quad). Pt will require further acute OT to address visual field deficits and provide education on compensatory techniques for ADLs. Family present and very supportive. Recommend dc to home with follow up at OP OT.      Recommendations for follow up therapy are one component of a multi-disciplinary discharge planning process, led by the attending physician.  Recommendations may be updated based on patient status, additional functional criteria and insurance authorization.   Follow Up Recommendations  Outpatient OT    Assistance Recommended at Discharge Intermittent Supervision/Assistance  Patient can return home with the following A little help with walking and/or transfers;A little help with bathing/dressing/bathroom    Functional Status Assessment  Patient has had a recent decline in their functional status and demonstrates the ability to make significant improvements in function in a reasonable and predictable amount of time.  Equipment Recommendations  Tub/shower seat (May not need pending progress)    Recommendations for Other Services       Precautions / Restrictions Precautions Precautions: Fall Precaution Comments: watch BP Restrictions Weight Bearing Restrictions: No      Mobility Bed Mobility Overal bed mobility: Needs Assistance Bed Mobility: Supine to Sit     Supine to sit:  Supervision, HOB elevated     General bed mobility comments: Extra time, HOB elevated, supervision for safety.    Transfers Overall transfer level: Needs assistance Equipment used: Rolling walker (2 wheels) Transfers: Sit to/from Stand Sit to Stand: Min guard, +2 safety/equipment           General transfer comment: Min guard assist for safety, no LOB, pt pulling up on RW. Cues provided for feet placement.      Balance Overall balance assessment: Mild deficits observed, not formally tested                                         ADL either performed or assessed with clinical judgement   ADL Overall ADL's : Needs assistance/impaired Eating/Feeding: Set up;Sitting   Grooming: Oral care;Min guard;Standing   Upper Body Bathing: Supervision/ safety;Set up;Sitting   Lower Body Bathing: Min guard;Sit to/from stand   Upper Body Dressing : Supervision/safety;Set up;Sitting   Lower Body Dressing: Min guard;Sit to/from stand   Toilet Transfer: Min guard;Ambulation;Rolling walker (2 wheels)           Functional mobility during ADLs: Min guard;Rolling walker (2 wheels);+2 for safety/equipment General ADL Comments: Pt performing at [facility] A level. demonstrating decreased balance with fatigue and visual in left visual field. Pt groggy post prodecure and more fatigued. Very motivated     Vision Baseline Vision/History: 0 No visual deficits Patient Visual Report: Other (comment);Blurring of vision (Recent vision issues were described as blurry and dots on L side.) Additional Comments: L visual field cut in upper quad. Also noting decreased smooth tracking  and jumping at midline.     Perception     Praxis      Pertinent Vitals/Pain Pain Assessment Pain Assessment: 0-10 Pain Score: 7  Pain Location: head and nasal region Pain Descriptors / Indicators: Discomfort, Grimacing, Guarding Pain Intervention(s): Monitored during session, Limited activity within  patient's tolerance, Repositioned     Hand Dominance Right   Extremity/Trunk Assessment Upper Extremity Assessment Upper Extremity Assessment: Overall WFL for tasks assessed   Lower Extremity Assessment Lower Extremity Assessment: Defer to PT evaluation   Cervical / Trunk Assessment Cervical / Trunk Assessment: Normal   Communication Communication Communication: Prefers language other than English ([place]; wife and daughter can translate)   Cognition Arousal/Alertness: Awake/alert Behavior During Therapy: WFL for tasks assessed/performed Overall Cognitive Status: Within Functional Limits for tasks assessed                                 General Comments: family confirmed no changes in memory, processing speed, cognition etc. Language barrier affecting formal assessment, but pt appears to follow commands appropriately with daughter or wife translating     General Comments  92/68 supine, 108/72 sitting, 93/75 standing, 101/73 sitting up in chair, 103/76 sitting up in chair several min; SpO2 stable on RA; slight dizziness and nausea reported during session. Daughter and wife present throughout    Exercises     Shoulder Instructions      Home Living Family/patient expects to be discharged to:: Private residence Living Arrangements: Spouse/significant other;Children (adult daughter) Available Help at Discharge: Family Type of Home: House Home Access: Stairs to enter Technical brewer of Steps: 3 Entrance Stairs-Rails: Left (ascending) Home Layout: One level     Bathroom Shower/Tub: Occupational psychologist: Standard Bathroom Accessibility: Yes   Home Equipment: None          Prior Functioning/Environment Prior Level of Function : Independent/Modified Independent;Driving;Working/employed             Mobility Comments: Pt fell last time he was in a hospital with a cardiac condition. 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        OT Problem List: Impaired balance (sitting and/or standing);Decreased knowledge of use of DME or AE;Decreased knowledge of precautions;Impaired vision/perception      OT Treatment/Interventions: Self-care/ADL training;Therapeutic exercise;Energy conservation;DME and/or AE instruction;Therapeutic activities;Patient/family education    OT Goals(Current goals can be found in the care plan section) Acute Rehab OT Goals Patient Stated Goal: Get stronger and go home with family OT Goal Formulation: With patient/family Time For Goal Achievement: 12/07/21 Potential to Achieve Goals: Good  OT Frequency: Min 3X/week    Co-evaluation PT/OT/SLP Co-Evaluation/Treatment: Yes Reason for Co-Treatment: For patient/therapist safety;To address functional/ADL transfers PT goals addressed during session: Mobility/safety with mobility;Proper use of DME;Balance OT goals addressed during session: ADL's and self-care      AM-PAC OT "6 Clicks" Daily Activity     Outcome Measure Help from another person eating meals?: None Help from another person taking care of personal grooming?: A Little Help from another person toileting, which includes using toliet, bedpan, or urinal?: A Little Help from another person bathing (including washing, rinsing, drying)?: A Little Help from another person to put on and taking off regular upper body clothing?: A Little Help from another person to put on and taking off regular lower body clothing?: A Little 6 Click Score:  19   End of Session Equipment Utilized During Treatment: Rolling walker (2 wheels);Gait belt Nurse Communication: Mobility status  Activity Tolerance: Patient tolerated treatment well Patient left: in chair;with call bell/phone within reach;with family/visitor present  OT Visit Diagnosis: Unsteadiness on feet (R26.81);Other abnormalities of gait and mobility (R26.89);Muscle weakness (generalized) (M62.81)                 Time: 4720-7218 OT Time Calculation (min): 28 min Charges:  OT General Charges $OT Visit: 1 Visit OT Evaluation $OT Eval Moderate Complexity: 1 Mod  Hien Cunliffe MSOT, OTR/L Acute Rehab Office: [place] 11/23/2021, 12:15 PM

## 2021-11-23 NOTE — Progress Notes (Signed)
Neurosurgery Service Progress Note  Subjective: No acute events overnight, some nausea and vomiting last night but improved this morning, I/O well matched, vision subjectively improved  Objective: Vitals:   11/23/21 0300 11/23/21 0400 11/23/21 0500 11/23/21 0600  BP: 101/77 1'00/76 96/72 91/73 '$  Pulse: 88 84 75 77  Resp: '15 16 17 14  '$ Temp:  100 F (37.8 C)    TempSrc:  Axillary    SpO2: 92% 93% 91% 93%  Weight:      Height:        Physical Exam: Aox3, PERRL, EOMI, fS & SS, VF with bitemporal hemianopsia, FCx4  Assessment & Plan: 61 y.o. man s/p TSA for pit adenoma, recovering well.  -transfer out of ICU -d/c A-line / foley -PT/OT -SCDs/TEDs, SQH POD2 -possible discharge later today or tomorrow depending on how he does with nausea / fatigue   Judith Part  11/23/21 7:07 AM

## 2021-11-24 ENCOUNTER — Inpatient Hospital Stay (HOSPITAL_COMMUNITY): Payer: Medicaid Other

## 2021-11-24 DIAGNOSIS — M7989 Other specified soft tissue disorders: Secondary | ICD-10-CM

## 2021-11-24 DIAGNOSIS — Z9889 Other specified postprocedural states: Secondary | ICD-10-CM

## 2021-11-24 MED ORDER — APIXABAN 5 MG PO TABS
5.0000 mg | ORAL_TABLET | Freq: Two times a day (BID) | ORAL | Status: DC
  Administered 2021-11-24 – 2021-11-25 (×3): 5 mg via ORAL
  Filled 2021-11-24 (×3): qty 1

## 2021-11-24 NOTE — Progress Notes (Addendum)
Right LE venous duplex study completed. RN notified of preliminary results.  Please see CV Proc for preliminary results.  Hoang Pettingill BS, RVT 11/24/2021 12:18 PM

## 2021-11-24 NOTE — Progress Notes (Signed)
Age-indeterminate DVT, given the new calf pain, safest to assume this is acute, will restart his apixiban, keep overnight for monitoring while it becomes therapeutic. Given his recent surgery, will hold off on a loading dose.

## 2021-11-24 NOTE — Discharge Summary (Signed)
Discharge Summary  Date of Admission: 11/22/2021  Date of Discharge: 11/25/2021  Attending Physician: Emelda Brothers, MD  Hospital Course: Patient was admitted following an uncomplicated endoscopic endonasal TSA for pituitary adenoma. They were recovered in PACU and transferred to 4N. His vision improved post-op, no clinical evidence of hypopituitarism, no CSF leak. On POD2, he complained of some new calf pain on the right, DVT US showed an age-indeterminate DVT, so his home eliquis was restarted early. He was stable following this and therefore discharged home on 11/25/21. They will follow up in clinic with me in clinic in 2 weeks.  Neurologic exam at discharge:  Strength 5/5 x4 and SILTx4, VF with bitemporal hemianopsia  Discharge diagnosis: Pituitary adenoma  Judith Part, MD 11/24/21 8:42 PM

## 2021-11-24 NOTE — Progress Notes (Signed)
Occupational Therapy Treatment Patient Details Name: Hunter Miller MRN: [id number] DOB: 29-Jul-1960 Today's Date: 11/24/2021   History of present illness 61 y.o. man s/p Endonasal endoscopic transphenoidal resection for pit adenoma on 8/2. 8/4 new RLE DVT.PMH: dysrhythmia, HTN   OT comments  This 61 yo male seen at bed level this PM due to newly diagnosed DVT in LE. Had pt try pen and paper task that he did well on as well as finding items in his room visually. Educated pt and family on what vision deficits he is currently having. We will continue to follow.   Recommendations for follow up therapy are one component of a multi-disciplinary discharge planning process, led by the attending physician.  Recommendations may be updated based on patient status, additional functional criteria and insurance authorization.    Follow Up Recommendations  Outpatient OT    Assistance Recommended at Discharge Intermittent Supervision/Assistance  Patient can return home with the following  A little help with walking and/or transfers;A little help with bathing/dressing/bathroom         Precautions / Restrictions Precautions Precautions: Fall Restrictions Weight Bearing Restrictions: No                Extremity/Trunk Assessment Upper Extremity Assessment Upper Extremity Assessment: Overall WFL for tasks assessed            Vision   Additional Comments: Educated pt and family on what part of pt's vision he is missing currently as well as compensation strategies for new environments (scanning). Pt did well on pen and paper task of tracing and numbering circles.          Cognition Arousal/Alertness: Awake/alert Behavior During Therapy: WFL for tasks assessed/performed Overall Cognitive Status: Within Functional Limits for tasks assessed                                                     Pertinent Vitals/ Pain       Pain Assessment Pain Assessment: No/denies  pain         Frequency  Min 3X/week        Progress Toward Goals  OT Goals(current goals can now be found in the care plan section)  Progress towards OT goals: Not progressing toward goals - comment (due to need to stay in bed currenlty)  Acute Rehab OT Goals Patient Stated Goal: to go home OT Goal Formulation: With patient/family Time For Goal Achievement: 12/07/21 Potential to Achieve Goals: Good  Plan Discharge plan remains appropriate       AM-PAC OT "6 Clicks" Daily Activity     Outcome Measure   Help from another person eating meals?: None Help from another person taking care of personal grooming?: A Little Help from another person toileting, which includes using toliet, bedpan, or urinal?: A Little Help from another person bathing (including washing, rinsing, drying)?: A Little Help from another person to put on and taking off regular upper body clothing?: A Little Help from another person to put on and taking off regular lower body clothing?: A Little 6 Click Score: 19    End of Session    OT Visit Diagnosis: Low vision, both eyes (H54.2)   Activity Tolerance Patient tolerated treatment well   Patient Left in bed       Time: 1442-1501 OT Time Calculation (min): 19 min  Charges: OT General Charges $OT Visit: 1 Visit OT Treatments $Therapeutic Activity: 8-22 mins  [place], OTR/L Acute Rehab Services [facility] [phone] Office [phone]    Almon Register 11/24/2021, 8:56 PM

## 2021-11-24 NOTE — Progress Notes (Addendum)
Neurosurgery Service Progress Note  Subjective: No acute events overnight, nausea resolved, some dizziness that's improving, vision improving slowly, new R calf pain that is atypical for him, no CP / SOB / tachycardia / tachypnea  Objective: Vitals:   11/23/21 1957 11/23/21 2253 11/24/21 0310 11/24/21 0738  BP: 99/66 112/77 113/85 112/77  Pulse: 85  90 87  Resp: '18 16 18 14  '$ Temp: 98 F (36.7 C) 98.1 F (36.7 C) 98.8 F (37.1 C) 97.9 F (36.6 C)  TempSrc: Oral Oral Oral Oral  SpO2: 98% 95% 96% 98%  Weight:      Height:        Physical Exam: Aox3, PERRL, EOMI, fS & SS, VF with bitemporal hemianopsia, Fcx4, +hoover's sign in RLE with some calf tenderness, no clear RLE edema  Assessment & Plan: 61 y.o. man s/p TSA for pit adenoma, recovering well.  -DVT U/S RLE to r/o DVT, discharge pending study -PT/OT -SCDs/TEDs, SQH -likely discharge this afternoon pending RLE U/S  Judith Part  11/24/21 9:33 AM

## 2021-11-24 NOTE — Progress Notes (Signed)
Physical Therapy Treatment Patient Details Name: Hunter Miller MRN: [id number] DOB: 10/08/1960 Today's Date: 11/24/2021   History of Present Illness 61 y.o. 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 PMH: dysrhythmia, HTN    PT Comments    Pt is making good, steady progress with mobility, ambulating an increased distance with improved speed and fluidity without an AD today. However, he remains at risk for falls, supported by his DGI score of 15, primarily impacted by feeling dizzy and bil calf (R>L) pain (notified RN and MD). I anticipate his stability and gait speed to progress as he mobilizes more frequently, thus do not anticipate need for follow-up PT. He was also able to navigate stairs with one handrail with min guard assist, no LOB. Educated pt and family for pt to use RW when mobilizing alone but have guarding when ambulating without UE support to decrease his risk for falls. They verbalized understanding. Will continue to follow acutely.     Recommendations for follow up therapy are one component of a multi-disciplinary discharge planning process, led by the attending physician.  Recommendations may be updated based on patient status, additional functional criteria and insurance authorization.  Follow Up Recommendations  No PT follow up     Assistance Recommended at Discharge Intermittent Supervision/Assistance  Patient can return home with the following A little help with bathing/dressing/bathroom;Assistance with cooking/housework;Assist for transportation;Help with stairs or ramp for entrance;A little help with walking and/or transfers   Equipment Recommendations  Rolling walker (2 wheels)    Recommendations for Other Services       Precautions / Restrictions Precautions Precautions: Fall Restrictions Weight Bearing Restrictions: No     Mobility  Bed Mobility Overal bed mobility: Needs Assistance Bed Mobility: Supine to Sit     Supine to sit: Supervision, HOB elevated      General bed mobility comments: Extra time, HOB elevated, supervision for safety.    Transfers Overall transfer level: Needs assistance Equipment used: None Transfers: Sit to/from Stand Sit to Stand: Supervision           General transfer comment: Supervision for safety, no LOB, increased time to rise.    Ambulation/Gait Ambulation/Gait assistance: Min guard, Supervision Gait Distance (Feet): 270 Feet (x2 bouts of ~270 ft > ~20 ft) Assistive device: None Gait Pattern/deviations: Step-through pattern, Decreased stride length, Shuffle (sways to R on occasion) Gait velocity: reduced Gait velocity interpretation: <1.8 ft/sec, indicate of risk for recurrent falls   General Gait Details: Pt with slow, guarded gait, and decreased stride length with shuffling gait. Speed and fluidity improved as distance progressed, but continued to display some mild R ankle instability with R stance due to R calf pain, resulting in R trunk sway intermittenty. No LOB needing assistance to recover though, min guard-supervision for safety. Educated pt and family to have him use the RW when alone and have guarding/supervision when without RW until dizziness and balance improve   Stairs Stairs: Yes Stairs assistance: Min guard Stair Management: One rail Left, One rail Right, Alternating pattern, Step to pattern, Forwards Number of Stairs: 3 General stair comments: Ascends with L rail with reciprocal pattern and descends with R rail with step-to pattern (reporting dizziness increased when descending). No LOB, min guard for safety   Wheelchair Mobility    Modified Rankin (Stroke Patients Only)       Balance Overall balance assessment: Mild deficits observed, not formally tested  Standardized Balance Assessment Standardized Balance Assessment : Dynamic Gait Index   Dynamic Gait Index Level Surface: Mild Impairment Change in Gait Speed: Mild  Impairment Gait with Horizontal Head Turns: Mild Impairment Gait with Vertical Head Turns: Mild Impairment Gait and Pivot Turn: Mild Impairment Step Over Obstacle: Moderate Impairment Step Around Obstacles: Normal Steps: Moderate Impairment Total Score: 15      Cognition Arousal/Alertness: Awake/alert Behavior During Therapy: WFL for tasks assessed/performed Overall Cognitive Status: Within Functional Limits for tasks assessed                                          Exercises      General Comments General comments (skin integrity, edema, etc.): continues to display visual field cut in L lateral/superior aspect and possibly in R superior aspect?      Pertinent Vitals/Pain Pain Assessment Pain Assessment: 0-10 Pain Score: 8  Pain Location: head and nasal region; bil calves (R>L) Pain Descriptors / Indicators: Discomfort, Grimacing, Guarding Pain Intervention(s): Limited activity within patient's tolerance, Monitored during session, Repositioned, Other (comment) (no redness or increased heat noted either calf compared to the other, but notified MD who verbally cleared pt to work with PT at this time)    Home Living                          Prior Function            PT Goals (current goals can now be found in the care plan section) Acute Rehab PT Goals Patient Stated Goal: to improve PT Goal Formulation: With patient/family Time For Goal Achievement: 11/30/21 Potential to Achieve Goals: Good Progress towards PT goals: Progressing toward goals    Frequency    Min 3X/week      PT Plan Current plan remains appropriate    Co-evaluation              AM-PAC PT "6 Clicks" Mobility   Outcome Measure  Help needed turning from your back to your side while in a flat bed without using bedrails?: A Little Help needed moving from lying on your back to sitting on the side of a flat bed without using bedrails?: A Little Help needed moving  to and from a bed to a chair (including a wheelchair)?: A Little Help needed standing up from a chair using your arms (e.g., wheelchair or bedside chair)?: A Little Help needed to walk in hospital room?: A Little Help needed climbing 3-5 steps with a railing? : A Little 6 Click Score: 18    End of Session Equipment Utilized During Treatment: Gait belt Activity Tolerance: Patient tolerated treatment well;Patient limited by pain Patient left: with call bell/phone within reach;with family/visitor present;in bed Nurse Communication: Mobility status;Other (comment) (calf pain) PT Visit Diagnosis: Unsteadiness on feet (R26.81);Other abnormalities of gait and mobility (R26.89);Dizziness and giddiness (R42);Other symptoms and signs involving the nervous system (Z61.096)     Time: 0454-0981 PT Time Calculation (min) (ACUTE ONLY): 23 min  Charges:  $Gait Training: 23-37 mins                     Moishe Spice, PT, DPT Acute Rehabilitation Services  Office: [place] 11/24/2021, 10:02 AM

## 2021-11-24 NOTE — Progress Notes (Signed)
Patient family requested to give patient bath since he is shy. RN brought supplies to room and wrapped patient IV. Patient did not c/o pain in leg until working with PT after shower.  PT informed RN of pain after therapy and I went in to assess. Meds given for pain, which were effective. Patient and family were educated about possible DVT and prevention of aggravating the vein and leg until further testing.

## 2021-11-25 MED ORDER — CEPHALEXIN 500 MG PO CAPS: 500.0000 mg | ORAL_CAPSULE | Freq: Three times a day (TID) | ORAL | 0 refills | Status: DC

## 2021-11-25 MED ORDER — DOCUSATE SODIUM 100 MG PO CAPS: 100.0000 mg | ORAL_CAPSULE | Freq: Two times a day (BID) | ORAL | 0 refills | Status: DC

## 2021-11-25 MED ORDER — ONDANSETRON HCL 4 MG PO TABS: 4.0000 mg | ORAL_TABLET | ORAL | 0 refills | Status: DC | PRN

## 2021-11-25 MED ORDER — HYDROCODONE-ACETAMINOPHEN 5-325 MG PO TABS: 1.0000 | ORAL_TABLET | ORAL | 0 refills | Status: DC | PRN

## 2021-11-25 NOTE — Progress Notes (Signed)
Occupational Therapy Treatment Patient Details Name: Hunter Miller MRN: [id number] DOB: 01/28/1961 Today's Date: 11/25/2021   History of present illness 61 y.o. 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 Pt found to have R lower extremity DVT 8/4. PMH: dysrhythmia, HTN   OT comments  This 61 yo male seen today to re-check vision. Field cut seems to have resolved from gross testing--informed family that an eye doctor could do a 360 visual field test to check if there is still small areas of visual field loss as well as to look at what appears to be 2 small pockets of fluid on medical aspect of each eye. Pt still demonstrating trouble with smoothness of pursuits and saccades. He is also still reporting dizziness (his head is spinning) in sitting and standing (BP was stable (actually high when checked in sitting, standing, and 3 minute standing). He will continue to benefit from follow up OT at outpatient.   Recommendations for follow up therapy are one component of a multi-disciplinary discharge planning process, led by the attending physician.  Recommendations may be updated based on patient status, additional functional criteria and insurance authorization.    Follow Up Recommendations  Outpatient OT    Assistance Recommended at Discharge PRN  Patient can return home with the following  A little help with walking and/or transfers;A little help with bathing/dressing/bathroom;Assistance with cooking/housework;Help with stairs or ramp for entrance;Assist for transportation   Equipment Recommendations  Tub/shower seat       Precautions / Restrictions Precautions Precautions: Fall Restrictions Weight Bearing Restrictions: No                 Extremity/Trunk Assessment Upper Extremity Assessment Upper Extremity Assessment: Overall WFL for tasks assessed            Vision   Additional Comments: Vision retested today and he does not appear to have a visual field cut when grossly tested,  but he does have decreased smoothness of pursuits and he really struggles with saccades.          Cognition Arousal/Alertness: Awake/alert Behavior During Therapy: WFL for tasks assessed/performed Overall Cognitive Status: Within Functional Limits for tasks assessed                                                     Pertinent Vitals/ Pain       Pain Assessment Pain Assessment: 0-10 Faces Pain Scale: Hurts worst Pain Location: head (frontal but more on left than right) Pain Descriptors / Indicators: Tightness, Aching Pain Intervention(s): Limited activity within patient's tolerance, Monitored during session, Patient requesting pain meds-RN notified            Progress Toward Goals  OT Goals(current goals can now be found in the care plan section)  Progress towards OT goals:  (slow due to dizziness; however visual field cut appears to have grossly resolved.)  Acute Rehab OT Goals Patient Stated Goal: to go home OT Goal Formulation: With patient/family Time For Goal Achievement: 12/07/21 Potential to Achieve Goals: Good  Plan Discharge plan remains appropriate       AM-PAC OT "6 Clicks" Daily Activity     Outcome Measure   Help from another person eating meals?: None Help from another person taking care of personal grooming?: A Little Help from another person toileting, which includes using toliet, bedpan,  or urinal?: A Little Help from another person bathing (including washing, rinsing, drying)?: A Little Help from another person to put on and taking off regular upper body clothing?: A Little Help from another person to put on and taking off regular lower body clothing?: A Little 6 Click Score: 19    End of Session    OT Visit Diagnosis: Low vision, both eyes (H54.2)   Activity Tolerance  (limited by dizziness (his head spinning)--no nystagmus seen and 10/10 pain in head at end of session)   Patient Left in chair;with call bell/phone within  reach;with family/visitor present   Nurse Communication  (pt ready to go, needs OPOT set up, pt requesting pain meds as well as anti-nausea meds for home)        Time: 7680-8811 OT Time Calculation (min): 35 min  Charges: OT General Charges $OT Visit: 1 Visit OT Treatments $Self Care/Home Management : 23-37 mins  [place], OTR/L Acute Rehab Services [facility] [phone] Office [phone]    Almon Register 11/25/2021, 11:17 AM

## 2021-11-25 NOTE — TOC Transition Note (Signed)
Transition of Care [facility]) - CM/SW Discharge Note   Patient Details  Name: Hunter Miller MRN: [id number] Date of Birth: July 02, 1960  [facility]) CM/SW Contact:  Bartholomew Crews, RN Phone Number: [phone] 11/25/2021, 9:45 AM   Clinical Narrative:     Patient to transition home today. Outpatient OT referral sent to neuro outpatient per OT request. Referral to [employer] for DME RW. No further TOC needs identified at this time.   Final next level of care: OP Rehab Barriers to Discharge: No Barriers Identified   Patient Goals and CMS Choice        Discharge Placement                       Discharge Plan and Services                DME Arranged: Walker rolling DME Agency: [employer] Date DME Agency Contacted: 11/25/21 Time DME Agency Contacted: [phone] Representative spoke with at DME Agency: [place]: NA [place] Agency: NA        Social Determinants of Health ([place]) Interventions     Readmission Risk Interventions     No data to display

## 2021-11-25 NOTE — Progress Notes (Signed)
Pt discharged home with wife and daughter. PIV removed and RW delivered to bedside prior to d/c. Pt and family educated on all discharge information with no questions or concerns at this time.   Justice Rocher, RN

## 2021-11-25 NOTE — Progress Notes (Signed)
Physical Therapy Treatment Patient Details Name: Hunter Miller MRN: [id number] DOB: 1961-02-27 Today's Date: 11/25/2021   History of Present Illness 61 y.o. 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 Pt found to have R lower extremity DVT 8/4. PMH: dysrhythmia, HTN    PT Comments    Pt is reporting less dizziness and less R calf pain today. However, he continues to ambulate at a slow pace with guarded gait. His speed did improve some, but he continues to display intermittent swaying bouts due to dizziness. No LOB, min guard-supervision for all standing mobility. Will continue to follow acutely. Current recommendations remain appropriate.    Recommendations for follow up therapy are one component of a multi-disciplinary discharge planning process, led by the attending physician.  Recommendations may be updated based on patient status, additional functional criteria and insurance authorization.  Follow Up Recommendations  No PT follow up     Assistance Recommended at Discharge Intermittent Supervision/Assistance  Patient can return home with the following A little help with bathing/dressing/bathroom;Assistance with cooking/housework;Assist for transportation;Help with stairs or ramp for entrance;A little help with walking and/or transfers   Equipment Recommendations  Rolling walker (2 wheels)    Recommendations for Other Services       Precautions / Restrictions Precautions Precautions: Fall Restrictions Weight Bearing Restrictions: No     Mobility  Bed Mobility Overal bed mobility: Modified Independent Bed Mobility: Supine to Sit     Supine to sit: HOB elevated, Modified independent (Device/Increase time)     General bed mobility comments: Extra time, HOB elevated    Transfers Overall transfer level: Needs assistance Equipment used: None Transfers: Sit to/from Stand Sit to Stand: Supervision           General transfer comment: Supervision for safety, no LOB     Ambulation/Gait Ambulation/Gait assistance: Min guard, Supervision Gait Distance (Feet): 400 Feet Assistive device: None Gait Pattern/deviations: Step-through pattern, Decreased stride length (sways to R on occasion) Gait velocity: reduced Gait velocity interpretation: <1.8 ft/sec, indicate of risk for recurrent falls   General Gait Details: Pt with slow, guarded gait, and decreased stride length. Cues provided to increase speed and stride length, mod success noted. Pt with intermittent trunk sway or slows speed when changing head positions. At one point, pt reached out for daughter's hand for support due to dizziness, but no LOB. Min guard-supervision for safety. Encouraged family to guard pt, especially on gravel driveway, with gait belt provided.   Stairs             Wheelchair Mobility    Modified Rankin (Stroke Patients Only)       Balance Overall balance assessment: Mild deficits observed, not formally tested                                          Cognition Arousal/Alertness: Awake/alert Behavior During Therapy: WFL for tasks assessed/performed Overall Cognitive Status: Within Functional Limits for tasks assessed                                          Exercises      General Comments        Pertinent Vitals/Pain Pain Assessment Pain Assessment: Faces Faces Pain Scale: Hurts little more Pain Location: head and nasal region; R calf Pain  Descriptors / Indicators: Discomfort, Grimacing, Guarding Pain Intervention(s): Limited activity within patient's tolerance, Monitored during session, Repositioned    Home Living                          Prior Function            PT Goals (current goals can now be found in the care plan section) Acute Rehab PT Goals Patient Stated Goal: to improve PT Goal Formulation: With patient/family Time For Goal Achievement: 11/30/21 Potential to Achieve Goals: Good Progress  towards PT goals: Progressing toward goals    Frequency    Min 3X/week      PT Plan Current plan remains appropriate    Co-evaluation              AM-PAC PT "6 Clicks" Mobility   Outcome Measure  Help needed turning from your back to your side while in a flat bed without using bedrails?: None Help needed moving from lying on your back to sitting on the side of a flat bed without using bedrails?: None Help needed moving to and from a bed to a chair (including a wheelchair)?: A Little Help needed standing up from a chair using your arms (e.g., wheelchair or bedside chair)?: A Little Help needed to walk in hospital room?: A Little Help needed climbing 3-5 steps with a railing? : A Little 6 Click Score: 20    End of Session Equipment Utilized During Treatment: Gait belt Activity Tolerance: Patient tolerated treatment well Patient left: with call bell/phone within reach;with family/visitor present;in chair Nurse Communication: Mobility status PT Visit Diagnosis: Unsteadiness on feet (R26.81);Other abnormalities of gait and mobility (R26.89);Dizziness and giddiness (R42);Other symptoms and signs involving the nervous system (O35.009)     Time: 3818-2993 PT Time Calculation (min) (ACUTE ONLY): 15 min  Charges:  $Gait Training: 8-22 mins                     Moishe Spice, PT, DPT Acute Rehabilitation Services  Office: [place] 11/25/2021, 8:53 AM

## 2021-11-25 NOTE — Discharge Summary (Signed)
Physician Discharge Summary  Patient ID: Hunter Miller MRN: [id number] DOB/AGE: 11/02/1960 61 y.o.  Admit date: 11/22/2021 Discharge date: 11/25/2021  Admission Diagnoses: Pituitary macroadenoma  Discharge Diagnoses: The same and DVT Principal Problem:   Status post transsphenoidal pituitary resection [facility]) Active Problems:   Pituitary adenoma [facility])   Discharged Condition: fair  Hospital Course: Dr. Venetia Constable performed a transsphenoidal resection of the patient pituitary tumor on 11/22/2021.  The patient's postoperative course was remarkable for an age-indeterminate right lower extremity DVT.  He is on Eliquis chronically.  This was restarted yesterday.  The patient, and his family, requested discharge to home.  He was given written and verbal discharge instructions.  All their questions were answered.  Consults: PT, OT, care management Significant Diagnostic Studies: None Treatments: Transsphenoidal resection of pituitary tumor Discharge Exam: Blood pressure (!) 136/94, pulse 61, temperature 97.8 F (36.6 C), temperature source Oral, resp. rate 16, height '5\' 6"'$  (1.676 m), weight 78.5 kg, SpO2 95 %. The patient is alert and pleasant.  He is moving all 4 extremities well.  He has a bitemporal hemianopsia.  There is no nasal drainage.  Disposition: Home  Discharge Instructions     Ambulatory referral to Occupational Therapy   Complete by: As directed    Call MD for:  difficulty breathing, headache or visual disturbances   Complete by: As directed    Call MD for:  extreme fatigue   Complete by: As directed    Call MD for:  hives   Complete by: As directed    Call MD for:  persistant dizziness or light-headedness   Complete by: As directed    Call MD for:  persistant nausea and vomiting   Complete by: As directed    Call MD for:  redness, tenderness, or signs of infection (pain, swelling, redness, odor or green/yellow discharge around incision site)   Complete by: As  directed    Call MD for:  severe uncontrolled pain   Complete by: As directed    Call MD for:  temperature >100.4   Complete by: As directed    Diet - low sodium heart healthy   Complete by: As directed    Discharge instructions   Complete by: As directed    Call [phone] for a followup appointment. Take a stool softener while you are using pain medications.   Driving Restrictions   Complete by: As directed    Do not drive for 2 weeks.   Increase activity slowly   Complete by: As directed    Lifting restrictions   Complete by: As directed    Do not lift more than 5 pounds. No excessive bending or twisting.   May shower / Bathe   Complete by: As directed    Remove the dressing for 3 days after surgery.  You may shower, but leave the incision alone.   No dressing needed   Complete by: As directed       Allergies as of 11/25/2021   No Known Allergies      Medication List     TAKE these medications    apixaban 5 MG Tabs tablet Commonly known as: ELIQUIS Take 1 tablet (5 mg total) by mouth 2 (two) times daily.   atorvastatin 40 MG tablet Commonly known as: LIPITOR Take 1 tablet (40 mg total) by mouth daily at 6 PM.   cephALEXin 500 MG capsule Commonly known as: Keflex Take 1 capsule (500 mg total) by mouth 3 (three) times daily for 7  days.   cephALEXin 500 MG capsule Commonly known as: KEFLEX Take 1 capsule (500 mg total) by mouth every 8 (eight) hours.   docusate sodium 100 MG capsule Commonly known as: COLACE Take 1 capsule (100 mg total) by mouth 2 (two) times daily.   Farxiga 10 MG Tabs tablet Generic drug: dapagliflozin propanediol Take 10 mg by mouth daily.   HYDROcodone-acetaminophen 5-325 MG tablet Commonly known as: NORCO/VICODIN Take 1-2 tablets by mouth every 4 (four) hours as needed for moderate pain.   losartan 25 MG tablet Commonly known as: Cozaar Take 1 tablet (25 mg total) by mouth daily.   metoprolol succinate 25 MG 24 hr  tablet Commonly known as: TOPROL-XL Take 1 tablet (25 mg total) by mouth daily.   spironolactone 25 MG tablet Commonly known as: ALDACTONE Take 0.5 tablets (12.5 mg total) by mouth at bedtime.               Durable Medical Equipment  (From admission, onward)           Start     Ordered   11/25/21 0841  For home use only DME Walker rolling  Once       Question Answer Comment  Walker: With [place] Wheels   Patient needs a walker to treat with the following condition Home help needed      11/25/21 0840              Discharge Care Instructions  (From admission, onward)           Start     Ordered   11/25/21 0000  No dressing needed        11/25/21 3704            Follow-up Information     Jerrell Belfast, MD. Schedule an appointment as soon as possible for a visit in 3 week(s).   Specialty: Otolaryngology Contact information: [address] [place] [address] [phone]                 Signed: Ophelia Charter 11/25/2021, 9:49 AM

## 2021-11-27 LAB — SURGICAL PATHOLOGY

## 2021-12-21 ENCOUNTER — Other Ambulatory Visit: Payer: Self-pay | Admitting: Neurological Surgery

## 2021-12-21 DIAGNOSIS — D352 Benign neoplasm of pituitary gland: Secondary | ICD-10-CM

## 2022-01-18 ENCOUNTER — Ambulatory Visit
Admission: RE | Admit: 2022-01-18 | Discharge: 2022-01-18 | Disposition: A | Discharge status: Home / Self Care | Payer: Medicaid Other | Source: Ambulatory Visit | Attending: Neurological Surgery | Admitting: Neurological Surgery

## 2022-01-18 DIAGNOSIS — D352 Benign neoplasm of pituitary gland: Secondary | ICD-10-CM

## 2022-01-18 MED ORDER — GADOBENATE DIMEGLUMINE 529 MG/ML IV SOLN
8.0000 mL | Freq: Once | INTRAVENOUS | Status: AC | PRN
  Administered 2022-01-18: 8 mL via INTRAVENOUS

## 2022-02-06 ENCOUNTER — Ambulatory Visit: Payer: Medicaid Other | Admitting: Internal Medicine

## 2022-02-19 ENCOUNTER — Ambulatory Visit (INDEPENDENT_AMBULATORY_CARE_PROVIDER_SITE_OTHER): Payer: Medicaid Other | Admitting: Internal Medicine

## 2022-02-19 ENCOUNTER — Encounter: Payer: Self-pay | Admitting: Internal Medicine

## 2022-02-19 VITALS — BP 114/70 | HR 73 | Ht 66.0 in | Wt 184.0 lb

## 2022-02-19 DIAGNOSIS — E893 Postprocedural hypopituitarism: Secondary | ICD-10-CM | POA: Diagnosis not present

## 2022-02-19 LAB — CORTISOL: Cortisol, Plasma: 9.6 ug/dL

## 2022-02-19 LAB — BASIC METABOLIC PANEL
BUN: 27 mg/dL — ABNORMAL HIGH (ref 6–23)
CO2: 25 mEq/L (ref 19–32)
Calcium: 9.8 mg/dL (ref 8.4–10.5)
Chloride: 104 mEq/L (ref 96–112)
Creatinine, Ser: 1.23 mg/dL (ref 0.40–1.50)
GFR: 62.93 mL/min (ref >60.00)
Glucose, Bld: 97 mg/dL (ref 70–99)
Potassium: 4.3 mEq/L (ref 3.5–5.1)
Sodium: 138 mEq/L (ref 135–145)

## 2022-02-19 LAB — TSH: TSH: 1.7 u[IU]/mL (ref 0.35–5.50)

## 2022-02-19 LAB — T4, FREE: Free T4: 0.91 ng/dL (ref 0.60–1.60)

## 2022-02-19 LAB — LUTEINIZING HORMONE: LH: 9.17 m[IU]/mL (ref 1.50–9.30)

## 2022-02-19 NOTE — Progress Notes (Unsigned)
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 male with a past medical history of HTN, Dyslipidemia and A. Flutter and CHF. 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   Pt follows with cardiology for A. Flutter and CHF. He is on Amiodarone    Per chart review, the pt has been noted with a pituitary adenoma on MRI 11/09/2020  Per pt has been diagnosed with this ~ 14 yrs ago and he declined surgery .   On his initial visit to our clinic he was noted with slightly elevated prolactin this was attributed to stalk effect.  ACTH, cortisol, LH, testosterone, and TFT have all come back normal  He is S/P transphenoidal pituitary resection 11/22/2021 with Dr. 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   He is S/P transsphenoidal pituitary resection on 11/22/2021, his postoperative course was remarkable for an age indeterminant right lower extremity DVT.  He is on Eliquis chronically   Denies headaches Vision has been improving  He noted a rash since sx - takes Claritin  Denies sweating  Denies swelling of hands and feet  Denies constipation  Denies palpitations Energy is better      Patient was seen by cardiology for a follow-up on A-fib, S/P ablation 04/2021   HISTORY:  Past Medical History:  Past Medical History:  Diagnosis Date   Dysrhythmia    A-fib   HTN (hypertension)    PONV (postoperative nausea and vomiting)    Past Surgical History:  Past Surgical History:  Procedure Laterality Date   A-FLUTTER ABLATION N/A 12/08/2020   Procedure:  A-FLUTTER ABLATION;  Surgeon: Vickie Epley, MD;  Location: [place] [facility];  Service: Cardiovascular;  Laterality: N/A;   ATRIAL FIBRILLATION ABLATION N/A 01/27/2021   Procedure: ATRIAL FIBRILLATION ABLATION;  Surgeon: Vickie Epley, MD;  Location: [facility] [facility];  Service: Cardiovascular;  Laterality: N/A;   CARDIOVERSION N/A 11/07/2020   Procedure: CARDIOVERSION;  Surgeon: Jolaine Artist, MD;  Location: [place];  Service: Cardiovascular;  Laterality: N/A;   CARDIOVERSION N/A 12/21/2020   Procedure: CARDIOVERSION;  Surgeon: Sueanne Margarita, MD;  Location: [facility] ENDOSCOPY;  Service: Cardiovascular;  Laterality: N/A;   CRANIOTOMY N/A 11/22/2021   Procedure: Endoscopic endonasal resection of pituitary tumor;  Surgeon: Judith Part, MD;  Location: [place];  Service: Neurosurgery;  Laterality: N/A;   RIGHT/LEFT HEART CATH AND CORONARY ANGIOGRAPHY N/A 11/04/2020   Procedure: RIGHT/LEFT HEART CATH AND CORONARY ANGIOGRAPHY;  Surgeon: Martinique, Peter M, MD;  Location: [place] [facility];  Service: Cardiovascular;  Laterality: N/A;   RIGHT/LEFT HEART CATH AND CORONARY/GRAFT ANGIOGRAPHY N/A 11/04/2020   Procedure: RIGHT/LEFT HEART CATH AND CORONARY/GRAFT ANGIOGRAPHY;  Surgeon: Martinique, Peter M, MD;  Location: [place] [facility];  Service: Cardiovascular;  Laterality: N/A;   TEE WITHOUT CARDIOVERSION N/A 11/07/2020   Procedure: TRANSESOPHAGEAL ECHOCARDIOGRAM (TEE);  Surgeon: Jolaine Artist, MD;  Location: [facility] ENDOSCOPY;  Service: Cardiovascular;  Laterality: N/A;   TRANSPHENOIDAL APPROACH EXPOSURE N/A 11/22/2021   Procedure: TRANSPHENOIDAL APPROACH EXPOSURE;  Surgeon: Jerrell Belfast, MD;  Location:  [place] [facility];  Service: ENT;  Laterality: N/A;    Social History:  reports that he has never smoked. He has never used smokeless tobacco. He reports that he does not drink alcohol and does not use drugs. Family History: family history includes Asthma in his father; COPD in his father.   HOME  MEDICATIONS: Allergies as of 02/19/2022   No Known Allergies      Medication List        Accurate as of February 19, 2022  7:39 AM. If you have any questions, ask your nurse or doctor.          apixaban 5 MG Tabs tablet Commonly known as: ELIQUIS Take 1 tablet (5 mg total) by mouth 2 (two) times daily.   atorvastatin 40 MG tablet Commonly known as: LIPITOR Take 1 tablet (40 mg total) by mouth daily at 6 PM.   cephALEXin 500 MG capsule Commonly known as: KEFLEX Take 1 capsule (500 mg total) by mouth every 8 (eight) hours.   docusate sodium 100 MG capsule Commonly known as: COLACE Take 1 capsule (100 mg total) by mouth 2 (two) times daily.   Farxiga 10 MG Tabs tablet Generic drug: dapagliflozin propanediol Take 10 mg by mouth daily.   HYDROcodone-acetaminophen 5-325 MG tablet Commonly known as: NORCO/VICODIN Take 1-2 tablets by mouth every 4 (four) hours as needed for moderate pain.   losartan 25 MG tablet Commonly known as: Cozaar Take 1 tablet (25 mg total) by mouth daily.   metoprolol succinate 25 MG 24 hr tablet Commonly known as: TOPROL-XL Take 1 tablet (25 mg total) by mouth daily.   ondansetron 4 MG tablet Commonly known as: ZOFRAN Take 1 tablet (4 mg total) by mouth every 4 (four) hours as needed for nausea or vomiting.   spironolactone 25 MG tablet Commonly known as: ALDACTONE Take 0.5 tablets (12.5 mg total) by mouth at bedtime.          REVIEW OF SYSTEMS: A comprehensive ROS was conducted with the patient and is negative except as per HPI     OBJECTIVE:  VS: BP 114/70 (BP Location: Left Arm, Patient Position: Sitting, Cuff Size: Small)   Pulse 73   Ht '5\' 6"'$  (1.676 m)   Wt 184 lb (83.5 kg)   SpO2 96%   BMI 29.70 kg/m    Wt Readings from Last 3 Encounters:  02/19/22 184 lb (83.5 kg)  11/22/21 173 lb (78.5 kg)  11/16/21 176 lb 1.6 oz (79.9 kg)     EXAM: General: Pt appears well and is in NAD  Neck: General: Supple without  adenopathy. Thyroid: Thyroid size normal.  No goiter or nodules appreciated.   Lungs: Clear with good BS bilat with no rales, rhonchi, or wheezes  Heart: Auscultation: RRR.  Abdomen: Normoactive bowel sounds, soft, nontender, without masses or organomegaly palpable  Extremities:  BL LE: No pretibial edema normal ROM and strength.  Mental Status: Judgment, insight: Intact Orientation: Oriented to time, place, and person Mood and affect: No depression, anxiety, or agitation     DATA REVIEWED:   Latest Reference Range & Units 02/19/22 07:51  Sodium 135 - 145 mEq/L 138  Potassium 3.5 - 5.1 mEq/L 4.3  Chloride 96 - 112 mEq/L 104  CO2 19 - 32 mEq/L 25  Glucose 70 - 99 mg/dL 97  BUN 6 - 23 mg/dL 27 (H)  Creatinine 0.40 - 1.50 mg/dL 1.23  Calcium 8.4 - 10.5 mg/dL 9.8  GFR >60.00 mL/min 62.93  Latest Reference Range & Units 02/19/22 07:51  Cortisol, Plasma ug/dL 9.6  LH 1.50 - 9.30 mIU/mL 9.17  Prolactin 2.0 - 18.0 ng/mL 7.0  Glucose 70 - 99 mg/dL 97  TSH 0.35 - 5.50 uIU/mL 1.70  T4,Free(Direct) 0.60 - 1.60 ng/dL 0.91       MRI 01/18/2022   Brain: No change in appearance of the brain itself. Diffusion imaging does not show any acute or subacute infarction. No evidence of chronic small vessel disease. No visible postoperative complication. No hydrocephalus or extra-axial collection.   Apparent total resection of the previous pituitary macro adenoma. Residual pituitary tissue along the floor of the sella, more along the right than the left, with deviation of the infundibulum towards the right. Follow-up will obviously be performed to assess for any recurrent tumor growth, but none is presently identified. No evidence of any injury to the optic nerve or chiasm.   Vascular: Major vessels at the base of the brain show flow.   Skull and upper cervical spine: Negative   Sinuses/Orbits: Clear other than small amount of fluid/packing material in the left division of the  sphenoid sinus.   Other: None   IMPRESSION: Apparent total resection of the previously seen large pituitary adenoma. No identifiable residual tumor tissue. Pituitary tissue remains along the floor of the sella, more on the right side, with deviation of the infundibulum towards the right. Follow-up will obviously be performed to assess for any recurrent tumor growth, but none is presently identified.   No evidence of operative complication. Optic nerves, optic chiasm and proximal optic tracts appear normal.   Old records , labs and images have been reviewed.     ASSESSMENT/PLAN/RECOMMENDATIONS:   Pituitary Macroadenoma., S/P transphenoidal hypophysectomy 11/2021 :   - Pt with no local symptoms - Clinically improving  - MRI 12/2021 showed surgical changes  -Labs today were normal including sodium, prolactin, LH, cortisol, and TFTs -Testosterone, IGF-I pending     F/U in 1 yr     Signed electronically by: Mack Guise, MD  [facility] Endocrinology  [address] [place], [place] [address] Phone: [phone] FAX: [phone]   CC: Gala Lewandowsky, [place] [address] Phone: [phone] Fax: [phone]   Return to Endocrinology clinic as below: Future Appointments  Date Time Provider [place]  03/05/2022  1:30 PM Fenton, Doloris Hall, PA [id number] None

## 2022-02-23 LAB — ACTH: C206 ACTH: 39 pg/mL (ref 6–50)

## 2022-02-23 LAB — TESTOSTERONE, TOTAL, LC/MS/MS: Testosterone, Total, LC-MS-MS: 385 ng/dL (ref 250–1100)

## 2022-02-23 LAB — INSULIN-LIKE GROWTH FACTOR
IGF-I, LC/MS: 121 ng/mL (ref 41–279)
Z-Score (Male): -0.1 SD (ref ?–2.0)

## 2022-02-23 LAB — PROLACTIN: Prolactin: 7 ng/mL (ref 2.0–18.0)

## 2022-03-05 ENCOUNTER — Ambulatory Visit (HOSPITAL_COMMUNITY)
Admission: RE | Admit: 2022-03-05 | Discharge: 2022-03-05 | Disposition: A | Discharge status: Home / Self Care | Payer: Medicaid Other | Source: Ambulatory Visit | Attending: Physician Assistant | Admitting: Physician Assistant

## 2022-03-05 VITALS — BP 124/94 | HR 70 | Ht 66.0 in | Wt 182.2 lb

## 2022-03-05 DIAGNOSIS — Z7901 Long term (current) use of anticoagulants: Secondary | ICD-10-CM | POA: Diagnosis not present

## 2022-03-05 DIAGNOSIS — I5022 Chronic systolic (congestive) heart failure: Secondary | ICD-10-CM | POA: Diagnosis not present

## 2022-03-05 DIAGNOSIS — I4819 Other persistent atrial fibrillation: Secondary | ICD-10-CM | POA: Diagnosis not present

## 2022-03-05 DIAGNOSIS — I480 Paroxysmal atrial fibrillation: Secondary | ICD-10-CM | POA: Diagnosis present

## 2022-03-05 DIAGNOSIS — I483 Typical atrial flutter: Secondary | ICD-10-CM | POA: Diagnosis not present

## 2022-03-05 DIAGNOSIS — Z79899 Other long term (current) drug therapy: Secondary | ICD-10-CM | POA: Insufficient documentation

## 2022-03-05 DIAGNOSIS — I110 Hypertensive heart disease with heart failure: Secondary | ICD-10-CM | POA: Insufficient documentation

## 2022-03-05 DIAGNOSIS — D6869 Other thrombophilia: Secondary | ICD-10-CM

## 2022-03-05 DIAGNOSIS — I484 Atypical atrial flutter: Secondary | ICD-10-CM | POA: Insufficient documentation

## 2022-03-05 DIAGNOSIS — E118 Type 2 diabetes mellitus with unspecified complications: Secondary | ICD-10-CM | POA: Insufficient documentation

## 2022-03-05 DIAGNOSIS — Z7984 Long term (current) use of oral hypoglycemic drugs: Secondary | ICD-10-CM | POA: Diagnosis not present

## 2022-03-05 NOTE — Progress Notes (Signed)
Primary Care Physician: Gala Lewandowsky, MD Primary Cardiologist: Dr Haroldine Laws  Primary Electrophysiologist: Dr Quentin Ore Referring Physician: Allena Katz NP   Hunter Miller is a 61 y.o. male with a history of HTN, DM, chronic systolic CHF, atrial flutter who presents for follow up in the [facility]. The patient was initially diagnosed with atrial flutter during a hospitalization 10/2020 for acute heart failure. He was started on amiodarone and underwent DCCV on 11/07/20. Patient is on Eliquis for a CHADS2VASC score of 3. He underwent atrial flutter ablation with Dr Quentin Ore on 12/08/20. He was also noted to have atypical atrial flutter at that time. Patient was seen in the [facility] 12/14/20 and was found to be back in atrial flutter. He does have symptoms of palpitations and dizziness. He underwent DCCV on 12/21/20 but reverted quickly to atrial flutter again. Seen by Dr Quentin Ore on 01/10/21 with plan for repeat ablation. Patient is s/p afib ablation on 01/27/21.   On follow up today, patient reports that he has done well from an afib standpoint. He has not had any episodes that he is aware of and is in SR today. He denies any bleeding issues on anticoagulation. He has had a pruritic rash on his legs for several weeks and is seeing his PCP later this week.   Today, he denies symptoms of palpitations, chest pain, shortness of breath, orthopnea, PND, lower extremity edema, presyncope, syncope, snoring, daytime somnolence, bleeding, or neurologic sequela. The patient is tolerating medications without difficulties and is otherwise without complaint today.    Atrial Fibrillation Risk Factors:  he does not have symptoms or diagnosis of sleep apnea. he does not have a history of rheumatic fever.   he has a BMI of Body mass index is 29.41 kg/m.Marland Kitchen Filed Weights   03/05/22 1312  Weight: 82.6 kg    Family History  Problem Relation Age of Onset   Asthma Father    COPD  Father      Atrial Fibrillation Management history:  Previous antiarrhythmic drugs: amiodarone  Previous cardioversions: 11/07/20, 12/21/20 Previous ablations: flutter 12/08/20, 01/27/21 CHADS2VASC score: 3 Anticoagulation history: Eliquis   Past Medical History:  Diagnosis Date   Dysrhythmia    A-fib   HTN (hypertension)    PONV (postoperative nausea and vomiting)    Past Surgical History:  Procedure Laterality Date   A-FLUTTER ABLATION N/A 12/08/2020   Procedure: A-FLUTTER ABLATION;  Surgeon: Vickie Epley, MD;  Location: [facility] [facility];  Service: Cardiovascular;  Laterality: N/A;   ATRIAL FIBRILLATION ABLATION N/A 01/27/2021   Procedure: ATRIAL FIBRILLATION ABLATION;  Surgeon: Vickie Epley, MD;  Location: [place] [facility];  Service: Cardiovascular;  Laterality: N/A;   CARDIOVERSION N/A 11/07/2020   Procedure: CARDIOVERSION;  Surgeon: Jolaine Artist, MD;  Location: [place];  Service: Cardiovascular;  Laterality: N/A;   CARDIOVERSION N/A 12/21/2020   Procedure: CARDIOVERSION;  Surgeon: Sueanne Margarita, MD;  Location: [facility] ENDOSCOPY;  Service: Cardiovascular;  Laterality: N/A;   CRANIOTOMY N/A 11/22/2021   Procedure: Endoscopic endonasal resection of pituitary tumor;  Surgeon: Judith Part, MD;  Location: [place];  Service: Neurosurgery;  Laterality: N/A;   RIGHT/LEFT HEART CATH AND CORONARY ANGIOGRAPHY N/A 11/04/2020   Procedure: RIGHT/LEFT HEART CATH AND CORONARY ANGIOGRAPHY;  Surgeon: Martinique, Peter M, MD;  Location: [place] [facility];  Service: Cardiovascular;  Laterality: N/A;   RIGHT/LEFT HEART CATH AND CORONARY/GRAFT ANGIOGRAPHY N/A 11/04/2020   Procedure: RIGHT/LEFT HEART CATH AND CORONARY/GRAFT ANGIOGRAPHY;  Surgeon: Martinique, Peter M, MD;  Location: [place] [facility];  Service: Cardiovascular;  Laterality: N/A;   TEE WITHOUT CARDIOVERSION N/A 11/07/2020   Procedure: TRANSESOPHAGEAL ECHOCARDIOGRAM (TEE);  Surgeon: Jolaine Artist, MD;  Location: [facility]  ENDOSCOPY;  Service: Cardiovascular;  Laterality: N/A;   TRANSPHENOIDAL APPROACH EXPOSURE N/A 11/22/2021   Procedure: TRANSPHENOIDAL APPROACH EXPOSURE;  Surgeon: Jerrell Belfast, MD;  Location: [place];  Service: ENT;  Laterality: N/A;    Current Outpatient Medications  Medication Sig Dispense Refill   apixaban (ELIQUIS) 5 MG TABS tablet Take 1 tablet (5 mg total) by mouth 2 (two) times daily. 60 tablet 5   atorvastatin (LIPITOR) 40 MG tablet Take 1 tablet (40 mg total) by mouth daily at 6 PM. 90 tablet 3   docusate sodium (COLACE) 100 MG capsule Take 1 capsule (100 mg total) by mouth 2 (two) times daily. 30 capsule 0   FARXIGA 10 MG TABS tablet Take 10 mg by mouth daily.     losartan (COZAAR) 25 MG tablet Take 1 tablet (25 mg total) by mouth daily. 90 tablet 0   metoprolol succinate (TOPROL-XL) 25 MG 24 hr tablet Take 1 tablet (25 mg total) by mouth daily. 90 tablet 3   ondansetron (ZOFRAN) 4 MG tablet Take 1 tablet (4 mg total) by mouth every 4 (four) hours as needed for nausea or vomiting. 20 tablet 0   spironolactone (ALDACTONE) 25 MG tablet Take 0.5 tablets (12.5 mg total) by mouth at bedtime. 45 tablet 3   No current facility-administered medications for this encounter.    No Known Allergies  Social History   Socioeconomic History   Marital status: Married    Spouse name: Not on file   Number of children: Not on file   Years of education: Not on file   Highest education level: Not on file  Occupational History   Not on file  Tobacco Use   Smoking status: Never   Smokeless tobacco: Never  Vaping Use   Vaping Use: Never used  Substance and Sexual Activity   Alcohol use: Never   Drug use: Never   Sexual activity: Not on file  Other Topics Concern   Not on file  Social History Narrative   Not on file   Social Determinants of Health   Financial Resource Strain: Medium Risk (11/08/2020)   Overall Financial Resource Strain (CARDIA)    Difficulty of Paying Living Expenses:  Somewhat hard  Food Insecurity: Food Insecurity Present (11/08/2020)   Hunger Vital Sign    Worried About [place] in the Last Year: Sometimes true    Ran Out of Food in the Last Year: Sometimes true  Transportation Needs: No Transportation Needs (11/08/2020)   PRAPARE - Hydrologist (Medical): No    Lack of Transportation (Non-Medical): No  Physical Activity: Not on file  Stress: Not on file  Social Connections: Not on file  Intimate Partner Violence: Not on file     ROS- All systems are reviewed and negative except as per the HPI above.  Physical Exam: Vitals:   03/05/22 1312  BP: (!) 124/94  Pulse: 70  Weight: 82.6 kg  Height: '5\' 6"'$  (1.676 m)     GEN- The patient is a well appearing male, alert and oriented x 3 today.   HEENT-head normocephalic, atraumatic, sclera clear, conjunctiva pink, hearing intact, trachea midline. Lungs- Clear to ausculation bilaterally, normal work of breathing Heart- Regular rate and rhythm, no murmurs,  rubs or gallops  GI- soft, NT, ND, + BS Extremities- no clubbing, cyanosis, or edema MS- no significant deformity or atrophy Skin- no rash or lesion Psych- euthymic mood, full affect Neuro- strength and sensation are intact   Wt Readings from Last 3 Encounters:  03/05/22 82.6 kg  02/19/22 83.5 kg  11/22/21 78.5 kg    EKG today demonstrates  SR, T wave changes inf and in precordial leads Vent. rate 70 BPM PR interval 208 ms QRS duration 106 ms QT/QTcB 392/423 ms   Echo 05/09/21 demonstrated   1. Left ventricular ejection fraction, by estimation, is 55 to 60%. The  left ventricle has normal function. The left ventricle has no regional  wall motion abnormalities. Left ventricular diastolic parameters were  normal.   2. Right ventricular systolic function is normal. The right ventricular  size is normal.   3. Left atrial size was mild to moderately dilated.   4. The mitral valve is normal in  structure. Trivial mitral valve  regurgitation. No evidence of mitral stenosis.   5. The aortic valve is normal in structure. Aortic valve regurgitation is  not visualized. No aortic stenosis is present.   6. The inferior vena cava is normal in size with greater than 50%  respiratory variability, suggesting right atrial pressure of 3 mmHg.   Epic records are reviewed at length today  CHA2DS2-VASc Score = 3  The patient's score is based upon: CHF History: 1 HTN History: 1 Diabetes History: 1 Stroke History: 0 Vascular Disease History: 0 Age Score: 0 Gender Score: 0        ASSESSMENT AND PLAN: 1. Atrial flutter, typical and atypical/paroxysmal atrial fibrillation The patient's CHA2DS2-VASc score is 3, indicating a 3.2% annual risk of stroke.   S/p afib ablation 01/27/21 Patient appears to be maintaining SR off AAD Continue metoprolol 25 mg daily Continue Eliquis 5 mg BID  2. Secondary Hypercoagulable State (ICD10:  D68.69) The patient is at significant risk for stroke/thromboembolism based upon his CHA2DS2-VASc Score of 3.  Continue Apixaban (Eliquis).   3. Chronic systolic CHF Suspected tachycardia mediated. EF 55-60% on echo 05/09/21 Followed in the [facility]  4. HTN Stable, no changes today.   Follow up with the AF clinic in 6 months.    [facility] [address] [place], [place] [address] [phone] 03/05/2022 1:38 PM

## 2022-03-22 ENCOUNTER — Telehealth: Payer: Self-pay | Admitting: *Deleted

## 2022-03-22 NOTE — Telephone Encounter (Signed)
   Patient Name: Hunter Miller  DOB: 26-May-1960 MRN: [id number]  Primary Cardiologist: Dr. Lars Mage  Chart reviewed as part of pre-operative protocol coverage.   Dental extractions of 1-2 teeth are considered low risk procedures per guidelines and generally do not require any specific cardiac clearance. It is also generally accepted that for extractions of 1-2 teeth and dental cleanings, there is no need to interrupt blood thinner therapy.  SBE prophylaxis is not required for the patient from a cardiac standpoint.  I will route this recommendation to the requesting party via Epic fax function and remove from pre-op pool.  Please call with questions.  Mayra Reel, NP 03/22/2022, 1:22 PM

## 2022-03-22 NOTE — Telephone Encounter (Signed)
1 extraction and local anthesia.

## 2022-03-22 NOTE — Telephone Encounter (Signed)
Preoperative team, please contact requesting office and asked about number of teeth to be extracted.  Once we know number of teeth be extracted we will well to provide recommendations for anticoagulation hold.  Thank you for your help.  Jossie Ng. Marv Alfrey NP-C     03/22/2022, 12:02 PM [place] [address] Office [phone] Fax [phone]

## 2022-03-22 NOTE — Telephone Encounter (Signed)
   Pre-operative Risk Assessment    Patient Name: Hunter Miller  DOB: 1960-08-31 MRN: [id number]     Request for Surgical Clearance    Procedure:  Dental Extraction - Amount of Teeth to be Pulled:     Date of Surgery:  Clearance TBD                                 Surgeon:  Dr. Roosvelt Maser Surgeon's Group or Practice Name:  [facility] Phone number:  [phone] Fax number:  [phone]   Type of Clearance Requested:   - Pharmacy:  Hold Apixaban (Eliquis)     Type of Anesthesia:  Not Indicated   Additional requests/questions:      Signed, Greer Ee   03/22/2022, 7:39 AM

## 2022-03-22 NOTE — Telephone Encounter (Signed)
Called dental office back ! Extraction and local.

## 2022-03-26 NOTE — Progress Notes (Unsigned)
Office Visit    Patient Name: Hunter Miller Date of Encounter: 03/27/2022  PCP:  Gala Lewandowsky, [place]  Cardiologist:  Glori Bickers, MD Advanced Practice Provider:  No care team member to display Electrophysiologist:  Vickie Epley, MD   HPI    Hunter Miller is a 61 y.o. male with a past medical history of possible right femoral artery versus cerebral angiogram in remote past (details not known), hypertension, hyperlipidemia, new diagnosis (7/22) of atrial flutter and systolic heart failure, atrial flutter with RVR presents today for follow-up appointment.  He presented to [facility] with chest pain.  Echo with EF 20 to 25%, mild AV sclerosis with no stenosis, mild to moderate MR, trace MR, no pericardial effusion.  He was diuresed with IV Lasix.  Also found to be in atrial flutter with RVR.  He was transferred to [facility] 7/22 for further management.  R/LHC 11/04/2020 with filling pressures, normal right pressures, reduced CO, CI 2.26.  Initiated GDMT with Entresto after ACEi washout, metoprolol succinate, and Spiriva.  Underwent successful TEE guided DCCV on 11/07/2020.  He was having significant dizziness so he was kept due to that for monitoring.  Entresto switched to losartan and Amio switched to 200 mg twice daily.  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  Evaluated by neuro and neurosurgery.  No intervention felt to be warranted.  He was recently seen by Afib clinic. No swelling in legs but does have a rash on left thigh.  He thinks this might be from the Eliquis but he has been on Eliquis for 2 years and the rash comes and goes.  He does do some walking but would like to increase his activity level.  He does work but work has been on hold since he has been feeling bad.  Overall, he has been feeling much better and remains in normal sinus  rhythm today.  No chest pain or SOB. No palpitations.   Reports no shortness of breath nor dyspnea on exertion. Reports no chest pain, pressure, or tightness. No edema, orthopnea, PND. Reports no palpitations.    Past Medical History    Past Medical History:  Diagnosis Date   Dysrhythmia    A-fib   HTN (hypertension)    PONV (postoperative nausea and vomiting)    Past Surgical History:  Procedure Laterality Date   A-FLUTTER ABLATION N/A 12/08/2020   Procedure: A-FLUTTER ABLATION;  Surgeon: Vickie Epley, MD;  Location: [place] CV LAB;  Service: Cardiovascular;  Laterality: N/A;   ATRIAL FIBRILLATION ABLATION N/A 01/27/2021   Procedure: ATRIAL FIBRILLATION ABLATION;  Surgeon: Vickie Epley, MD;  Location: [place] CV LAB;  Service: Cardiovascular;  Laterality: N/A;   CARDIOVERSION N/A 11/07/2020   Procedure: CARDIOVERSION;  Surgeon: Jolaine Artist, MD;  Location: [place];  Service: Cardiovascular;  Laterality: N/A;   CARDIOVERSION N/A 12/21/2020   Procedure: CARDIOVERSION;  Surgeon: Sueanne Margarita, MD;  Location: [facility] ENDOSCOPY;  Service: Cardiovascular;  Laterality: N/A;   CRANIOTOMY N/A 11/22/2021   Procedure: Endoscopic endonasal resection of pituitary tumor;  Surgeon: Judith Part, MD;  Location: [place];  Service: Neurosurgery;  Laterality: N/A;   RIGHT/LEFT HEART CATH AND CORONARY ANGIOGRAPHY N/A 11/04/2020   Procedure: RIGHT/LEFT HEART CATH AND CORONARY ANGIOGRAPHY;  Surgeon: Martinique, Peter M, MD;  Location: [place] CV LAB;  Service: Cardiovascular;  Laterality: N/A;  RIGHT/LEFT HEART CATH AND CORONARY/GRAFT ANGIOGRAPHY N/A 11/04/2020   Procedure: RIGHT/LEFT HEART CATH AND CORONARY/GRAFT ANGIOGRAPHY;  Surgeon: Martinique, Peter M, MD;  Location: [place] CV LAB;  Service: Cardiovascular;  Laterality: N/A;   TEE WITHOUT CARDIOVERSION N/A 11/07/2020   Procedure: TRANSESOPHAGEAL ECHOCARDIOGRAM (TEE);  Surgeon: Jolaine Artist, MD;  Location: [facility]  ENDOSCOPY;  Service: Cardiovascular;  Laterality: N/A;   TRANSPHENOIDAL APPROACH EXPOSURE N/A 11/22/2021   Procedure: TRANSPHENOIDAL APPROACH EXPOSURE;  Surgeon: Jerrell Belfast, MD;  Location: [place];  Service: ENT;  Laterality: N/A;    Allergies  No Known Allergies  EKGs/Labs/Other Studies Reviewed:   The following studies were reviewed today:  Echo 05/09/21 IMPRESSIONS     1. Left ventricular ejection fraction, by estimation, is 55 to 60%. The  left ventricle has normal function. The left ventricle has no regional  wall motion abnormalities. Left ventricular diastolic parameters were  normal.   2. Right ventricular systolic function is normal. The right ventricular  size is normal.   3. Left atrial size was mild to moderately dilated.   4. The mitral valve is normal in structure. Trivial mitral valve  regurgitation. No evidence of mitral stenosis.   5. The aortic valve is normal in structure. Aortic valve regurgitation is  not visualized. No aortic stenosis is present.   6. The inferior vena cava is normal in size with greater than 50%  respiratory variability, suggesting right atrial pressure of 3 mmHg.   FINDINGS   Left Ventricle: Left ventricular ejection fraction, by estimation, is 55  to 60%. The left ventricle has normal function. The left ventricle has no  regional wall motion abnormalities. The left ventricular internal cavity  size was normal in size. There is   no left ventricular hypertrophy. Left ventricular diastolic parameters  were normal.   Right Ventricle: The right ventricular size is normal. No increase in  right ventricular wall thickness. Right ventricular systolic function is  normal.   Left Atrium: Left atrial size was mild to moderately dilated.   Right Atrium: Right atrial size was normal in size.   Pericardium: There is no evidence of pericardial effusion.   Mitral Valve: The mitral valve is normal in structure. There is mild  thickening of  the mitral valve leaflet(s). Mild mitral annular  calcification. Trivial mitral valve regurgitation. No evidence of mitral  valve stenosis.   Tricuspid Valve: The tricuspid valve is normal in structure. Tricuspid  valve regurgitation is trivial. No evidence of tricuspid stenosis.   Aortic Valve: The aortic valve is normal in structure. Aortic valve  regurgitation is not visualized. No aortic stenosis is present. Aortic  valve mean gradient measures 2.0 mmHg. Aortic valve peak gradient measures  3.3 mmHg. Aortic valve area, by VTI  measures 3.36 cm.   Pulmonic Valve: The pulmonic valve was not well visualized. Pulmonic valve  regurgitation is not visualized. No evidence of pulmonic stenosis.   Aorta: The aortic root is normal in size and structure.   Venous: The inferior vena cava is normal in size with greater than 50%  respiratory variability, suggesting right atrial pressure of 3 mmHg.   IAS/Shunts: No atrial level shunt detected by color flow Doppler.   EKG:  EKG is not ordered today.  Recent Labs: 05/09/2021: ALT 33 11/22/2021: Hemoglobin 14.5; Platelets 232 02/19/2022: BUN 27; Creatinine, Ser 1.23; Potassium 4.3; Sodium 138; TSH 1.70  Recent Lipid Panel    Component Value Date/Time   CHOL 178 01/02/2021 0959   TRIG 82  01/02/2021 0959   HDL 64 01/02/2021 0959   CHOLHDL 2.8 01/02/2021 0959   VLDL 16 01/02/2021 0959   LDLCALC 98 01/02/2021 0959   LDLDIRECT 171.4 (H) 11/09/2020 1855    Risk Assessment/Calculations:   CHA2DS2-VASc Score = 3   This indicates a 3.2% annual risk of stroke. The patient's score is based upon: CHF History: 1 HTN History: 1 Diabetes History: 1 Stroke History: 0 Vascular Disease History: 0 Age Score: 0 Gender Score: 0     Home Medications   Current Meds  Medication Sig   apixaban (ELIQUIS) 5 MG TABS tablet Take 1 tablet (5 mg total) by mouth 2 (two) times daily.   atorvastatin (LIPITOR) 40 MG tablet Take 1 tablet (40 mg total) by  mouth daily at 6 PM.   FARXIGA 10 MG TABS tablet Take 10 mg by mouth daily.   metoprolol succinate (TOPROL-XL) 25 MG 24 hr tablet Take 1 tablet (25 mg total) by mouth daily.   spironolactone (ALDACTONE) 25 MG tablet Take 0.5 tablets (12.5 mg total) by mouth at bedtime.     Review of Systems      All other systems reviewed and are otherwise negative except as noted above.  Physical Exam    VS:  BP 126/86   Pulse 66   Ht '5\' 6"'$  (1.676 m)   Wt 182 lb (82.6 kg)   SpO2 96%   BMI 29.38 kg/m  , BMI Body mass index is 29.38 kg/m.  Wt Readings from Last 3 Encounters:  03/27/22 182 lb (82.6 kg)  03/05/22 182 lb 3.2 oz (82.6 kg)  02/19/22 184 lb (83.5 kg)     GEN: Well nourished, well developed, in no acute distress. HEENT: normal. Neck: Supple, no JVD, carotid bruits, or masses. Cardiac: RRR, no murmurs, rubs, or gallops. No clubbing, cyanosis, edema.  Radials/PT 2+ and equal bilaterally.  Respiratory:  Respirations regular and unlabored, clear to auscultation bilaterally. GI: Soft, nontender, nondistended. MS: No deformity or atrophy. Skin: Warm and dry, no rash. Neuro:  Strength and sensation are intact. Psych: Normal affect.  Assessment & Plan    Atrial flutter, typical and atypical/paroxysmal atrial fibrillation -NSR today -He remains on Eliquis without issues with bleeding.  -he mentions that he thinks he has a rash from this medication but he has been on it for 2 years and the rash comes and goes on his left thigh  Secondary hypercoagulable state -continue Eliquis   Chronic systolic CHF -euvolemic today -continue current medications: Losartan '25mg'$  daily, spirolactone 12.'5mg'$  daily, metoprolol succinate '25mg'$  daily  Hypertension -BP well controlled today -refilled his losartan        Disposition: Follow up 6 months with Glori Bickers, MD or APP.  Signed, Elgie Collard, PA-C 03/27/2022, 10:47 AM [place]

## 2022-03-27 ENCOUNTER — Ambulatory Visit: Payer: Medicaid Other | Attending: Physician Assistant | Admitting: Physician Assistant

## 2022-03-27 ENCOUNTER — Encounter: Payer: Self-pay | Admitting: Physician Assistant

## 2022-03-27 VITALS — BP 126/86 | HR 66 | Ht 66.0 in | Wt 182.0 lb

## 2022-03-27 DIAGNOSIS — I10 Essential (primary) hypertension: Secondary | ICD-10-CM

## 2022-03-27 DIAGNOSIS — I5022 Chronic systolic (congestive) heart failure: Secondary | ICD-10-CM

## 2022-03-27 DIAGNOSIS — I4819 Other persistent atrial fibrillation: Secondary | ICD-10-CM | POA: Diagnosis not present

## 2022-03-27 DIAGNOSIS — D6869 Other thrombophilia: Secondary | ICD-10-CM | POA: Diagnosis not present

## 2022-03-27 MED ORDER — LOSARTAN POTASSIUM 25 MG PO TABS: 25.0000 mg | ORAL_TABLET | Freq: Every day | ORAL | 1 refills | Status: DC

## 2022-03-27 NOTE — Patient Instructions (Signed)
Medication Instructions:  Your physician recommends that you continue on your current medications as directed. Please refer to the Current Medication list given to you today.  *If you need a refill on your cardiac medications before your next appointment, please call your pharmacy*   Lab Work: None If you have labs (blood work) drawn today and your tests are completely normal, you will receive your results only by: [place] (if you have MyChart) OR A paper copy in the mail If you have any lab test that is abnormal or we need to change your treatment, we will call you to review the results.   Follow-Up: At [facility], you and your health needs are our priority.  As part of our continuing mission to provide you with exceptional heart care, we have created designated Provider Care Teams.  These Care Teams include your primary Cardiologist (physician) and Advanced Practice Providers (APPs -  Physician Assistants and Nurse Practitioners) who all work together to provide you with the care you need, when you need it.   Your next appointment:   6 month(s)  The format for your next appointment:   In Person  Provider:   Dr Quentin Ore  Important Information About [place]

## 2022-04-20 ENCOUNTER — Other Ambulatory Visit: Payer: Self-pay | Admitting: Cardiology

## 2022-04-20 NOTE — Telephone Encounter (Signed)
Prescription refill request for Eliquis received. Indication:afib Last office visit:12/23 Scr:1.2 Age: 61 Weight:82.6  kg  Prescription refilled

## 2022-05-11 ENCOUNTER — Other Ambulatory Visit: Payer: Self-pay

## 2022-05-11 MED ORDER — METOPROLOL SUCCINATE ER 25 MG PO TB24: 25.0000 mg | ORAL_TABLET | Freq: Every day | ORAL | 3 refills | Status: AC

## 2022-05-23 ENCOUNTER — Telehealth: Payer: Self-pay | Admitting: Cardiology

## 2022-05-23 NOTE — Telephone Encounter (Unsigned)
*  STAT* If patient is at the pharmacy, call can be transferred to refill team.   1. Which medications need to be refilled? (please list name of each medication and dose if known) FARXIGA 10 MG TABS tablet   2. Which pharmacy/location (including street and city if local pharmacy) is medication to be sent to? [id number] - [place], [place] - [address] AT [address]   3. Do they need a 30 day or 90 day supply? 90 day  Patient is completely out of medication.

## 2022-05-24 MED ORDER — FARXIGA 10 MG PO TABS: 10.0000 mg | ORAL_TABLET | Freq: Every day | ORAL | 3 refills | Status: AC

## 2022-05-24 NOTE — Telephone Encounter (Signed)
Pt's medication was sent to pt's pharmacy as requested. Confirmation received.

## 2022-08-22 ENCOUNTER — Other Ambulatory Visit: Payer: Self-pay

## 2022-08-22 MED ORDER — SPIRONOLACTONE 25 MG PO TABS: 12.5000 mg | ORAL_TABLET | Freq: Every day | ORAL | 2 refills | Status: AC

## 2022-09-10 ENCOUNTER — Ambulatory Visit (HOSPITAL_COMMUNITY)
Admission: RE | Admit: 2022-09-10 | Discharge: 2022-09-10 | Disposition: A | Discharge status: Home / Self Care | Payer: Medicaid Other | Source: Ambulatory Visit | Attending: Physician Assistant | Admitting: Physician Assistant

## 2022-09-10 VITALS — BP 122/76 | HR 56 | Ht 66.0 in | Wt 162.6 lb

## 2022-09-10 DIAGNOSIS — Z7901 Long term (current) use of anticoagulants: Secondary | ICD-10-CM | POA: Diagnosis not present

## 2022-09-10 DIAGNOSIS — I480 Paroxysmal atrial fibrillation: Secondary | ICD-10-CM | POA: Diagnosis present

## 2022-09-10 DIAGNOSIS — Z79899 Other long term (current) drug therapy: Secondary | ICD-10-CM | POA: Insufficient documentation

## 2022-09-10 DIAGNOSIS — I5022 Chronic systolic (congestive) heart failure: Secondary | ICD-10-CM | POA: Insufficient documentation

## 2022-09-10 DIAGNOSIS — I484 Atypical atrial flutter: Secondary | ICD-10-CM | POA: Insufficient documentation

## 2022-09-10 DIAGNOSIS — D6869 Other thrombophilia: Secondary | ICD-10-CM | POA: Diagnosis not present

## 2022-09-10 DIAGNOSIS — R001 Bradycardia, unspecified: Secondary | ICD-10-CM | POA: Diagnosis not present

## 2022-09-10 DIAGNOSIS — I110 Hypertensive heart disease with heart failure: Secondary | ICD-10-CM | POA: Diagnosis not present

## 2022-09-10 DIAGNOSIS — I483 Typical atrial flutter: Secondary | ICD-10-CM | POA: Diagnosis not present

## 2022-09-10 DIAGNOSIS — I440 Atrioventricular block, first degree: Secondary | ICD-10-CM | POA: Insufficient documentation

## 2022-09-10 MED ORDER — ATORVASTATIN CALCIUM 40 MG PO TABS: 40.0000 mg | ORAL_TABLET | Freq: Every day | ORAL | 3 refills | Status: AC

## 2022-09-10 MED ORDER — LOSARTAN POTASSIUM 25 MG PO TABS: 25.0000 mg | ORAL_TABLET | Freq: Every day | ORAL | 1 refills | Status: AC

## 2022-09-10 MED ORDER — APIXABAN 5 MG PO TABS: ORAL_TABLET | ORAL | 5 refills | Status: AC

## 2022-09-10 NOTE — Progress Notes (Signed)
Primary Care Physician: Maris Berger, MD Primary Cardiologist: Dr Gala Romney  Primary Electrophysiologist: Dr Lalla Brothers Referring Physician: Prince Rome NP   Hunter Miller is a 62 y.o. male with a history of HTN, DM, chronic systolic CHF, atrial flutter who presents for follow up in the [facility]. The patient was initially diagnosed with atrial flutter during a hospitalization 10/2020 for acute heart failure. He was started on amiodarone and underwent DCCV on 11/07/20. Patient is on Eliquis for a CHADS2VASC score of 3. He underwent atrial flutter ablation with Dr Lalla Brothers on 12/08/20. He was also noted to have atypical atrial flutter at that time. Patient was seen in the [facility] 12/14/20 and was found to be back in atrial flutter. He does have symptoms of palpitations and dizziness. He underwent DCCV on 12/21/20 but reverted quickly to atrial flutter again. Seen by Dr Lalla Brothers on 01/10/21 with plan for repeat ablation. Patient is s/p afib ablation on 01/27/21.   An interpreter was used for his visit today. On follow up today, patient reports that he has done well from a cardiac standpoint. He does have small pruritic bumps on his left arm, similar to what he had on his legs. He also has some brief dizziness on standing first thing in the morning but no other time. No tachypalpitations.   Today, he denies symptoms of palpitations, chest pain, shortness of breath, orthopnea, PND, lower extremity edema, presyncope, syncope, snoring, daytime somnolence, bleeding, or neurologic sequela. The patient is tolerating medications without difficulties and is otherwise without complaint today.    Atrial Fibrillation Risk Factors:  he does not have symptoms or diagnosis of sleep apnea. he does not have a history of rheumatic fever.   he has a BMI of Body mass index is 26.24 kg/m.Marland Kitchen Filed Weights   09/10/22 1046  Weight: 73.8 kg   Family History  Problem Relation Age of Onset    Asthma Father    COPD Father      Atrial Fibrillation Management history:  Previous antiarrhythmic drugs: amiodarone  Previous cardioversions: 11/07/20, 12/21/20 Previous ablations: flutter 12/08/20, 01/27/21 Anticoagulation history: Eliquis   Past Medical History:  Diagnosis Date   Dysrhythmia    A-fib   HTN (hypertension)    PONV (postoperative nausea and vomiting)    Past Surgical History:  Procedure Laterality Date   A-FLUTTER ABLATION N/A 12/08/2020   Procedure: A-FLUTTER ABLATION;  Surgeon: Lanier Prude, MD;  Location: [facility] INVASIVE CV LAB;  Service: Cardiovascular;  Laterality: N/A;   ATRIAL FIBRILLATION ABLATION N/A 01/27/2021   Procedure: ATRIAL FIBRILLATION ABLATION;  Surgeon: Lanier Prude, MD;  Location: [facility] INVASIVE CV LAB;  Service: Cardiovascular;  Laterality: N/A;   CARDIOVERSION N/A 11/07/2020   Procedure: CARDIOVERSION;  Surgeon: Dolores Patty, MD;  Location: [facility] ENDOSCOPY;  Service: Cardiovascular;  Laterality: N/A;   CARDIOVERSION N/A 12/21/2020   Procedure: CARDIOVERSION;  Surgeon: Quintella Reichert, MD;  Location: [facility] ENDOSCOPY;  Service: Cardiovascular;  Laterality: N/A;   CRANIOTOMY N/A 11/22/2021   Procedure: Endoscopic endonasal resection of pituitary tumor;  Surgeon: Jadene Pierini, MD;  Location: [facility] OR;  Service: Neurosurgery;  Laterality: N/A;   RIGHT/LEFT HEART CATH AND CORONARY ANGIOGRAPHY N/A 11/04/2020   Procedure: RIGHT/LEFT HEART CATH AND CORONARY ANGIOGRAPHY;  Surgeon: Swaziland, Peter M, MD;  Location: [facility] INVASIVE CV LAB;  Service: Cardiovascular;  Laterality: N/A;   RIGHT/LEFT HEART CATH AND CORONARY/GRAFT ANGIOGRAPHY N/A 11/04/2020   Procedure: RIGHT/LEFT HEART CATH AND CORONARY/GRAFT ANGIOGRAPHY;  Surgeon:  Swaziland, Peter M, MD;  Location: [facility] INVASIVE CV LAB;  Service: Cardiovascular;  Laterality: N/A;   TEE WITHOUT CARDIOVERSION N/A 11/07/2020   Procedure: TRANSESOPHAGEAL ECHOCARDIOGRAM (TEE);  Surgeon: Dolores Patty, MD;  Location:  [facility] ENDOSCOPY;  Service: Cardiovascular;  Laterality: N/A;   TRANSPHENOIDAL APPROACH EXPOSURE N/A 11/22/2021   Procedure: TRANSPHENOIDAL APPROACH EXPOSURE;  Surgeon: Osborn Coho, MD;  Location: [facility] OR;  Service: ENT;  Laterality: N/A;    Current Outpatient Medications  Medication Sig Dispense Refill   atorvastatin (LIPITOR) 40 MG tablet Take 1 tablet (40 mg total) by mouth daily at 6 PM. 90 tablet 3   ELIQUIS 5 MG TABS tablet TAKE 1 TABLET(5 MG) BY MOUTH TWICE DAILY 60 tablet 5   FARXIGA 10 MG TABS tablet Take 1 tablet (10 mg total) by mouth daily. 90 tablet 3   losartan (COZAAR) 25 MG tablet Take 1 tablet (25 mg total) by mouth daily. 90 tablet 1   metoprolol succinate (TOPROL-XL) 25 MG 24 hr tablet Take 1 tablet (25 mg total) by mouth daily. 90 tablet 3   spironolactone (ALDACTONE) 25 MG tablet Take 0.5 tablets (12.5 mg total) by mouth at bedtime. 45 tablet 2   No current facility-administered medications for this encounter.    No Known Allergies  Social History   Socioeconomic History   Marital status: Married    Spouse name: Not on file   Number of children: Not on file   Years of education: Not on file   Highest education level: Not on file  Occupational History   Not on file  Tobacco Use   Smoking status: Never   Smokeless tobacco: Never  Vaping Use   Vaping Use: Never used  Substance and Sexual Activity   Alcohol use: Never   Drug use: Never   Sexual activity: Not on file  Other Topics Concern   Not on file  Social History Narrative   Not on file   Social Determinants of Health   Financial Resource Strain: Medium Risk (11/08/2020)   Overall Financial Resource Strain (CARDIA)    Difficulty of Paying Living Expenses: Somewhat hard  Food Insecurity: Food Insecurity Present (11/08/2020)   Hunger Vital Sign    Worried About Running Out of Food in the Last Year: Sometimes true    Ran Out of Food in the Last Year: Sometimes true  Transportation Needs: No  Transportation Needs (11/08/2020)   PRAPARE - Administrator, Civil Service (Medical): No    Lack of Transportation (Non-Medical): No  Physical Activity: Not on file  Stress: Not on file  Social Connections: Not on file  Intimate Partner Violence: Not on file     ROS- All systems are reviewed and negative except as per the HPI above.  Physical Exam: Vitals:   09/10/22 1046  BP: 122/76  Pulse: (!) 56  Weight: 73.8 kg  Height: 5\' 6"  (1.676 m)    GEN- The patient is a well appearing male, alert and oriented x 3 today.   HEENT-head normocephalic, atraumatic, sclera clear, conjunctiva pink, hearing intact, trachea midline. Lungs- Clear to ausculation bilaterally, normal work of breathing Heart- Regular rate and rhythm, no murmurs, rubs or gallops  GI- soft, NT, ND, + BS Extremities- no clubbing, cyanosis, or edema MS- no significant deformity or atrophy Skin- no rash or lesion Psych- euthymic mood, full affect Neuro- strength and sensation are intact   Wt Readings from Last 3 Encounters:  09/10/22 73.8 kg  03/27/22 82.6 kg  03/05/22 82.6 kg    EKG today demonstrates  SB, 1st degree AV block, NST Vent. rate 56 BPM PR interval 218 ms QRS duration 104 ms QT/QTcB 426/411 ms   Echo 05/09/21 demonstrated   1. Left ventricular ejection fraction, by estimation, is 55 to 60%. The  left ventricle has normal function. The left ventricle has no regional  wall motion abnormalities. Left ventricular diastolic parameters were  normal.   2. Right ventricular systolic function is normal. The right ventricular  size is normal.   3. Left atrial size was mild to moderately dilated.   4. The mitral valve is normal in structure. Trivial mitral valve  regurgitation. No evidence of mitral stenosis.   5. The aortic valve is normal in structure. Aortic valve regurgitation is  not visualized. No aortic stenosis is present.   6. The inferior vena cava is normal in size with  greater than 50%  respiratory variability, suggesting right atrial pressure of 3 mmHg.   Epic records are reviewed at length today  CHA2DS2-VASc Score = 3  The patient's score is based upon: CHF History: 1 HTN History: 1 Diabetes History: 1 Stroke History: 0 Vascular Disease History: 0 Age Score: 0 Gender Score: 0       ASSESSMENT AND PLAN: 1. Atrial flutter, typical and atypical/paroxysmal atrial fibrillation The patient's CHA2DS2-VASc score is 3, indicating a 3.2% annual risk of stroke.   S/p afib ablation 01/27/21 Patient appears to be maintaining SR.  Continue metoprolol 25 mg daily Continue Eliquis 5 mg BID  2. Secondary Hypercoagulable State (ICD10:  D68.69) The patient is at significant risk for stroke/thromboembolism based upon his CHA2DS2-VASc Score of 3.  Continue Apixaban (Eliquis).   3. Chronic systolic CHF Suspected tachycardia mediated. EF 55-60% on echo 05/09/21  4. HTN Stable, no changes today.   Follow up with Dr Lalla Brothers as scheduled. AF clinic in one year.    Jorja Loa PA-C Afib Clinic [facility] [address] [place], [place] [address] [phone] 09/10/2022 11:07 AM

## 2022-09-28 ENCOUNTER — Ambulatory Visit: Payer: Medicaid Other | Admitting: Cardiology

## 2023-02-20 ENCOUNTER — Ambulatory Visit: Payer: Medicaid Other | Admitting: Internal Medicine

## 2023-02-20 NOTE — Progress Notes (Deleted)
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 male with a past medical history of HTN, Dyslipidemia and A. Flutter and CHF. 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   Pt follows with cardiology for A. Flutter and CHF. He is on Amiodarone    Per chart review, the pt has been noted with a pituitary adenoma on MRI 11/09/2020  Per pt has been diagnosed with this ~ 14 yrs ago and he declined surgery .   On his initial visit to our clinic he was noted with slightly elevated prolactin this was attributed to stalk effect.  ACTH, cortisol, LH, testosterone, and TFT have all come back normal  He is S/P transphenoidal pituitary resection 11/22/2021 with Dr. 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   He is S/P transsphenoidal pituitary resection on 11/22/2021, his postoperative course was remarkable for an age indeterminant right lower extremity DVT.  He is on Eliquis chronically   Denies headaches Vision has been improving  He noted a rash since sx - takes Claritin  Denies sweating  Denies swelling of hands and feet  Denies constipation  Denies palpitations Energy is better      Patient was seen by cardiology for a follow-up on A-fib, S/P ablation 04/2021   HISTORY:  Past Medical History:  Past Medical History:  Diagnosis Date   Dysrhythmia    A-fib   HTN (hypertension)    PONV (postoperative nausea and vomiting)    Past Surgical History:  Past Surgical History:  Procedure Laterality Date   A-FLUTTER ABLATION N/A 12/08/2020   Procedure:  A-FLUTTER ABLATION;  Surgeon: Lanier Prude, MD;  Location: [facility] INVASIVE CV LAB;  Service: Cardiovascular;  Laterality: N/A;   ATRIAL FIBRILLATION ABLATION N/A 01/27/2021   Procedure: ATRIAL FIBRILLATION ABLATION;  Surgeon: Lanier Prude, MD;  Location: [facility] INVASIVE CV LAB;  Service: Cardiovascular;  Laterality: N/A;   CARDIOVERSION N/A 11/07/2020   Procedure: CARDIOVERSION;  Surgeon: Dolores Patty, MD;  Location: [facility] ENDOSCOPY;  Service: Cardiovascular;  Laterality: N/A;   CARDIOVERSION N/A 12/21/2020   Procedure: CARDIOVERSION;  Surgeon: Quintella Reichert, MD;  Location: [facility] ENDOSCOPY;  Service: Cardiovascular;  Laterality: N/A;   CRANIOTOMY N/A 11/22/2021   Procedure: Endoscopic endonasal resection of pituitary tumor;  Surgeon: Jadene Pierini, MD;  Location: [facility] OR;  Service: Neurosurgery;  Laterality: N/A;   RIGHT/LEFT HEART CATH AND CORONARY ANGIOGRAPHY N/A 11/04/2020   Procedure: RIGHT/LEFT HEART CATH AND CORONARY ANGIOGRAPHY;  Surgeon: Swaziland, Peter M, MD;  Location: [facility] INVASIVE CV LAB;  Service: Cardiovascular;  Laterality: N/A;   RIGHT/LEFT HEART CATH AND CORONARY/GRAFT ANGIOGRAPHY N/A 11/04/2020   Procedure: RIGHT/LEFT HEART CATH AND CORONARY/GRAFT ANGIOGRAPHY;  Surgeon: Swaziland, Peter M, MD;  Location: [facility] INVASIVE CV LAB;  Service: Cardiovascular;  Laterality: N/A;   TEE WITHOUT CARDIOVERSION N/A 11/07/2020   Procedure: TRANSESOPHAGEAL ECHOCARDIOGRAM (TEE);  Surgeon: Dolores Patty, MD;  Location: [facility] ENDOSCOPY;  Service: Cardiovascular;  Laterality: N/A;   TRANSPHENOIDAL APPROACH EXPOSURE N/A 11/22/2021   Procedure: TRANSPHENOIDAL APPROACH EXPOSURE;  Surgeon: Osborn Coho, MD;  Location:  [facility] OR;  Service: ENT;  Laterality: N/A;    Social History:  reports that he has never smoked. He has never used smokeless tobacco. He reports that he does not drink alcohol and does not use drugs. Family History: family history includes Asthma in his father; COPD in his father.   HOME  MEDICATIONS: Allergies as of 02/20/2023   No Known Allergies      Medication List        Accurate as of February 20, 2023  6:53 AM. If you have any questions, ask your nurse or doctor.          apixaban 5 MG Tabs tablet Commonly known as: Eliquis TAKE 1 TABLET(5 MG) BY MOUTH TWICE DAILY   atorvastatin 40 MG tablet Commonly known as: LIPITOR Take 1 tablet (40 mg total) by mouth daily at 6 PM.   Farxiga 10 MG Tabs tablet Generic drug: dapagliflozin propanediol Take 1 tablet (10 mg total) by mouth daily.   losartan 25 MG tablet Commonly known as: Cozaar Take 1 tablet (25 mg total) by mouth daily.   metoprolol succinate 25 MG 24 hr tablet Commonly known as: TOPROL-XL Take 1 tablet (25 mg total) by mouth daily.   spironolactone 25 MG tablet Commonly known as: ALDACTONE Take 0.5 tablets (12.5 mg total) by mouth at bedtime.          REVIEW OF SYSTEMS: A comprehensive ROS was conducted with the patient and is negative except as per HPI     OBJECTIVE:  VS: There were no vitals taken for this visit.   Wt Readings from Last 3 Encounters:  09/10/22 162 lb 9.6 oz (73.8 kg)  03/27/22 182 lb (82.6 kg)  03/05/22 182 lb 3.2 oz (82.6 kg)     EXAM: General: Pt appears well and is in NAD  Neck: General: Supple without adenopathy. Thyroid: Thyroid size normal.  No goiter or nodules appreciated.   Lungs: Clear with good BS bilat with no rales, rhonchi, or wheezes  Heart: Auscultation: RRR.  Abdomen: Normoactive bowel sounds, soft, nontender, without masses or organomegaly palpable  Extremities:  BL LE: No pretibial edema normal ROM and strength.  Mental Status: Judgment, insight: Intact Orientation: Oriented to time, place, and person Mood and affect: No depression, anxiety, or agitation     DATA REVIEWED:   Latest Reference Range & Units 02/19/22 07:51  Sodium 135 - 145 mEq/L 138  Potassium 3.5 - 5.1 mEq/L 4.3  Chloride 96 - 112 mEq/L 104  CO2 19 - 32 mEq/L  25  Glucose 70 - 99 mg/dL 97  BUN 6 - 23 mg/dL 27 (H)  Creatinine 0.98 - 1.50 mg/dL 1.19  Calcium 8.4 - 14.7 mg/dL 9.8  GFR >82.95 mL/min 62.93     Latest Reference Range & Units 02/19/22 07:51  Cortisol, Plasma ug/dL 9.6  LH 6.21 - 3.08 mIU/mL 9.17  Prolactin 2.0 - 18.0 ng/mL 7.0  Glucose 70 - 99 mg/dL 97  TSH 6.57 - 8.46 uIU/mL 1.70  T4,Free(Direct) 0.60 - 1.60 ng/dL 9.62       MRI 9/52/8413   Brain: No change in appearance of the brain itself. Diffusion imaging does not show any acute or subacute infarction. No evidence of chronic small vessel disease. No visible postoperative complication. No hydrocephalus or extra-axial collection.   Apparent total resection of the previous pituitary macro adenoma. Residual pituitary tissue along the floor of the sella, more along the right than the left, with deviation of the infundibulum towards the  right. Follow-up will obviously be performed to assess for any recurrent tumor growth, but none is presently identified. No evidence of any injury to the optic nerve or chiasm.   Vascular: Major vessels at the base of the brain show flow.   Skull and upper cervical spine: Negative   Sinuses/Orbits: Clear other than small amount of fluid/packing material in the left division of the sphenoid sinus.   Other: None   IMPRESSION: Apparent total resection of the previously seen large pituitary adenoma. No identifiable residual tumor tissue. Pituitary tissue remains along the floor of the sella, more on the right side, with deviation of the infundibulum towards the right. Follow-up will obviously be performed to assess for any recurrent tumor growth, but none is presently identified.   No evidence of operative complication. Optic nerves, optic chiasm and proximal optic tracts appear normal.   Old records , labs and images have been reviewed.     ASSESSMENT/PLAN/RECOMMENDATIONS:   Pituitary Macroadenoma., S/P transphenoidal  hypophysectomy 11/2021 :   - Pt with no local symptoms - Clinically improving  - MRI 12/2021 showed surgical changes  -Labs today were normal including sodium, prolactin, LH, cortisol, and TFTs      F/U in 1 yr     Signed electronically by: Lyndle Herrlich, MD  [facility] Endocrinology  [facility] [address] [place], [place] [address] Phone: [phone] FAX: [phone]   CC: Maris Berger, MD [address] [place] [address] Phone: [phone] Fax: [phone]   Return to Endocrinology clinic as below: Future Appointments  Date Time Provider Department Center  02/20/2023  7:30 AM Angella Montas, Konrad Dolores, MD [id number] None
